# Patient Record
Sex: Female | Born: 1960
Health system: Southern US, Community
[De-identification: ages and names within clinical notes are randomized; demographics above are authoritative.]

## PROBLEM LIST (undated history)

## (undated) DIAGNOSIS — E039 Hypothyroidism, unspecified: Secondary | ICD-10-CM

## (undated) DIAGNOSIS — M8580 Other specified disorders of bone density and structure, unspecified site: Secondary | ICD-10-CM

## (undated) DIAGNOSIS — M1990 Unspecified osteoarthritis, unspecified site: Secondary | ICD-10-CM

## (undated) DIAGNOSIS — G43909 Migraine, unspecified, not intractable, without status migrainosus: Secondary | ICD-10-CM

## (undated) DIAGNOSIS — I10 Essential (primary) hypertension: Secondary | ICD-10-CM

## (undated) DIAGNOSIS — F419 Anxiety disorder, unspecified: Secondary | ICD-10-CM

## (undated) DIAGNOSIS — G4730 Sleep apnea, unspecified: Secondary | ICD-10-CM

## (undated) DIAGNOSIS — E079 Disorder of thyroid, unspecified: Secondary | ICD-10-CM

## (undated) DIAGNOSIS — K635 Polyp of colon: Secondary | ICD-10-CM

## (undated) DIAGNOSIS — R519 Headache, unspecified: Secondary | ICD-10-CM

## (undated) DIAGNOSIS — T7840XA Allergy, unspecified, initial encounter: Secondary | ICD-10-CM

## (undated) DIAGNOSIS — K921 Melena: Secondary | ICD-10-CM

## (undated) DIAGNOSIS — S82852S Displaced trimalleolar fracture of left lower leg, sequela: Secondary | ICD-10-CM

## (undated) DIAGNOSIS — F329 Major depressive disorder, single episode, unspecified: Secondary | ICD-10-CM

## (undated) DIAGNOSIS — L309 Dermatitis, unspecified: Secondary | ICD-10-CM

## (undated) DIAGNOSIS — K219 Gastro-esophageal reflux disease without esophagitis: Secondary | ICD-10-CM

## (undated) DIAGNOSIS — R51 Headache: Secondary | ICD-10-CM

## (undated) DIAGNOSIS — F32A Depression, unspecified: Secondary | ICD-10-CM

## (undated) DIAGNOSIS — G4733 Obstructive sleep apnea (adult) (pediatric): Secondary | ICD-10-CM

## (undated) HISTORY — DX: Dermatitis, unspecified: L30.9

## (undated) HISTORY — DX: Headache, unspecified: R51.9

## (undated) HISTORY — PX: WISDOM TOOTH EXTRACTION: SHX21

## (undated) HISTORY — DX: Migraine, unspecified, not intractable, without status migrainosus: G43.909

## (undated) HISTORY — DX: Headache: R51

## (undated) HISTORY — DX: Polyp of colon: K63.5

## (undated) HISTORY — DX: Major depressive disorder, single episode, unspecified: F32.9

## (undated) HISTORY — DX: Allergy, unspecified, initial encounter: T78.40XA

## (undated) HISTORY — DX: Melena: K92.1

## (undated) HISTORY — DX: Depression, unspecified: F32.A

## (undated) HISTORY — DX: Essential (primary) hypertension: I10

## (undated) HISTORY — DX: Obstructive sleep apnea (adult) (pediatric): G47.33

## (undated) HISTORY — DX: Other specified disorders of bone density and structure, unspecified site: M85.80

## (undated) HISTORY — DX: Sleep apnea, unspecified: G47.30

## (undated) HISTORY — DX: Unspecified osteoarthritis, unspecified site: M19.90

## (undated) HISTORY — DX: Disorder of thyroid, unspecified: E07.9

---

## 2002-03-30 HISTORY — PX: ABDOMINAL HYSTERECTOMY: SHX81

## 2014-06-22 ENCOUNTER — Ambulatory Visit: Payer: Managed Care, Other (non HMO) | Admitting: Internal Medicine

## 2014-06-25 ENCOUNTER — Encounter: Payer: Self-pay | Admitting: Internal Medicine

## 2014-06-25 ENCOUNTER — Ambulatory Visit (INDEPENDENT_AMBULATORY_CARE_PROVIDER_SITE_OTHER): Payer: Managed Care, Other (non HMO) | Admitting: Internal Medicine

## 2014-06-25 VITALS — BP 152/78 | HR 91 | Temp 98.1°F | Resp 18 | Ht 62.0 in | Wt 223.0 lb

## 2014-06-25 DIAGNOSIS — G4733 Obstructive sleep apnea (adult) (pediatric): Secondary | ICD-10-CM

## 2014-06-25 DIAGNOSIS — F329 Major depressive disorder, single episode, unspecified: Secondary | ICD-10-CM

## 2014-06-25 DIAGNOSIS — E039 Hypothyroidism, unspecified: Secondary | ICD-10-CM

## 2014-06-25 DIAGNOSIS — R209 Unspecified disturbances of skin sensation: Secondary | ICD-10-CM | POA: Diagnosis not present

## 2014-06-25 DIAGNOSIS — F32A Depression, unspecified: Secondary | ICD-10-CM

## 2014-06-25 DIAGNOSIS — I10 Essential (primary) hypertension: Secondary | ICD-10-CM

## 2014-06-25 DIAGNOSIS — J3089 Other allergic rhinitis: Secondary | ICD-10-CM

## 2014-06-25 DIAGNOSIS — K219 Gastro-esophageal reflux disease without esophagitis: Secondary | ICD-10-CM

## 2014-06-25 NOTE — Patient Instructions (Signed)
We will send you to the endocrine doctor for the thyroid. We will also get you a lung doctor for the CPAP. We will send you to a neurologist as well.   We will not check blood work today but will get your records.   When you return we will get you to the stomach doctor for the colon cancer screening. This way we will have your records from that as well.   We will see you back in about 6-12 months for a check up, if you have any new problems or questions please feel free to call the office before then. We are usually able to see you same day or next day for sickness and have a Saturday clinic at our office every week (I am not always here for that).   Health Maintenance Adopting a healthy lifestyle and getting preventive care can go a long way to promote health and wellness. Talk with your health care provider about what schedule of regular examinations is right for you. This is a good chance for you to check in with your provider about disease prevention and staying healthy. In between checkups, there are plenty of things you can do on your own. Experts have done a lot of research about which lifestyle changes and preventive measures are most likely to keep you healthy. Ask your health care provider for more information. WEIGHT AND DIET  Eat a healthy diet  Be sure to include plenty of vegetables, fruits, low-fat dairy products, and lean protein.  Do not eat a lot of foods high in solid fats, added sugars, or salt.  Get regular exercise. This is one of the most important things you can do for your health.  Most adults should exercise for at least 150 minutes each week. The exercise should increase your heart rate and make you sweat (moderate-intensity exercise).  Most adults should also do strengthening exercises at least twice a week. This is in addition to the moderate-intensity exercise.  Maintain a healthy weight  Body mass index (BMI) is a measurement that can be used to identify  possible weight problems. It estimates body fat based on height and weight. Your health care provider can help determine your BMI and help you achieve or maintain a healthy weight.  For females 18 years of age and older:   A BMI below 18.5 is considered underweight.  A BMI of 18.5 to 24.9 is normal.  A BMI of 25 to 29.9 is considered overweight.  A BMI of 30 and above is considered obese.  Watch levels of cholesterol and blood lipids  You should start having your blood tested for lipids and cholesterol at 54 years of age, then have this test every 5 years.  You may need to have your cholesterol levels checked more often if:  Your lipid or cholesterol levels are high.  You are older than 54 years of age.  You are at high risk for heart disease.  CANCER SCREENING   Lung Cancer  Lung cancer screening is recommended for adults 72-70 years old who are at high risk for lung cancer because of a history of smoking.  A yearly low-dose CT scan of the lungs is recommended for people who:  Currently smoke.  Have quit within the past 15 years.  Have at least a 30-pack-year history of smoking. A pack year is smoking an average of one pack of cigarettes a day for 1 year.  Yearly screening should continue until it has been  15 years since you quit.  Yearly screening should stop if you develop a health problem that would prevent you from having lung cancer treatment.  Breast Cancer  Practice breast self-awareness. This means understanding how your breasts normally appear and feel.  It also means doing regular breast self-exams. Let your health care provider know about any changes, no matter how small.  If you are in your 20s or 30s, you should have a clinical breast exam (CBE) by a health care provider every 1-3 years as part of a regular health exam.  If you are 71 or older, have a CBE every year. Also consider having a breast X-ray (mammogram) every year.  If you have a family  history of breast cancer, talk to your health care provider about genetic screening.  If you are at high risk for breast cancer, talk to your health care provider about having an MRI and a mammogram every year.  Breast cancer gene (BRCA) assessment is recommended for women who have family members with BRCA-related cancers. BRCA-related cancers include:  Breast.  Ovarian.  Tubal.  Peritoneal cancers.  Results of the assessment will determine the need for genetic counseling and BRCA1 and BRCA2 testing. Cervical Cancer Routine pelvic examinations to screen for cervical cancer are no longer recommended for nonpregnant women who are considered low risk for cancer of the pelvic organs (ovaries, uterus, and vagina) and who do not have symptoms. A pelvic examination may be necessary if you have symptoms including those associated with pelvic infections. Ask your health care provider if a screening pelvic exam is right for you.   The Pap test is the screening test for cervical cancer for women who are considered at risk.  If you had a hysterectomy for a problem that was not cancer or a condition that could lead to cancer, then you no longer need Pap tests.  If you are older than 65 years, and you have had normal Pap tests for the past 10 years, you no longer need to have Pap tests.  If you have had past treatment for cervical cancer or a condition that could lead to cancer, you need Pap tests and screening for cancer for at least 20 years after your treatment.  If you no longer get a Pap test, assess your risk factors if they change (such as having a new sexual partner). This can affect whether you should start being screened again.  Some women have medical problems that increase their chance of getting cervical cancer. If this is the case for you, your health care provider may recommend more frequent screening and Pap tests.  The human papillomavirus (HPV) test is another test that may be used  for cervical cancer screening. The HPV test looks for the virus that can cause cell changes in the cervix. The cells collected during the Pap test can be tested for HPV.  The HPV test can be used to screen women 59 years of age and older. Getting tested for HPV can extend the interval between normal Pap tests from three to five years.  An HPV test also should be used to screen women of any age who have unclear Pap test results.  After 54 years of age, women should have HPV testing as often as Pap tests.  Colorectal Cancer  This type of cancer can be detected and often prevented.  Routine colorectal cancer screening usually begins at 54 years of age and continues through 54 years of age.  Your health  care provider may recommend screening at an earlier age if you have risk factors for colon cancer.  Your health care provider may also recommend using home test kits to check for hidden blood in the stool.  A small camera at the end of a tube can be used to examine your colon directly (sigmoidoscopy or colonoscopy). This is done to check for the earliest forms of colorectal cancer.  Routine screening usually begins at age 28.  Direct examination of the colon should be repeated every 5-10 years through 54 years of age. However, you may need to be screened more often if early forms of precancerous polyps or small growths are found. Skin Cancer  Check your skin from head to toe regularly.  Tell your health care provider about any new moles or changes in moles, especially if there is a change in a mole's shape or color.  Also tell your health care provider if you have a mole that is larger than the size of a pencil eraser.  Always use sunscreen. Apply sunscreen liberally and repeatedly throughout the day.  Protect yourself by wearing long sleeves, pants, a wide-brimmed hat, and sunglasses whenever you are outside. HEART DISEASE, DIABETES, AND HIGH BLOOD PRESSURE   Have your blood pressure  checked at least every 1-2 years. High blood pressure causes heart disease and increases the risk of stroke.  If you are between 17 years and 2 years old, ask your health care provider if you should take aspirin to prevent strokes.  Have regular diabetes screenings. This involves taking a blood sample to check your fasting blood sugar level.  If you are at a normal weight and have a low risk for diabetes, have this test once every three years after 54 years of age.  If you are overweight and have a high risk for diabetes, consider being tested at a younger age or more often. PREVENTING INFECTION  Hepatitis B  If you have a higher risk for hepatitis B, you should be screened for this virus. You are considered at high risk for hepatitis B if:  You were born in a country where hepatitis B is common. Ask your health care provider which countries are considered high risk.  Your parents were born in a high-risk country, and you have not been immunized against hepatitis B (hepatitis B vaccine).  You have HIV or AIDS.  You use needles to inject street drugs.  You live with someone who has hepatitis B.  You have had sex with someone who has hepatitis B.  You get hemodialysis treatment.  You take certain medicines for conditions, including cancer, organ transplantation, and autoimmune conditions. Hepatitis C  Blood testing is recommended for:  Everyone born from [birth year] through 1965.  Anyone with known risk factors for hepatitis C. Sexually transmitted infections (STIs)  You should be screened for sexually transmitted infections (STIs) including gonorrhea and chlamydia if:  You are sexually active and are younger than 54 years of age.  You are older than 54 years of age and your health care provider tells you that you are at risk for this type of infection.  Your sexual activity has changed since you were last screened and you are at an increased risk for chlamydia or gonorrhea. Ask  your health care provider if you are at risk.  If you do not have HIV, but are at risk, it may be recommended that you take a prescription medicine daily to prevent HIV infection. This is called pre-exposure  prophylaxis (PrEP). You are considered at risk if:  You are sexually active and do not regularly use condoms or know the HIV status of your partner(s).  You take drugs by injection.  You are sexually active with a partner who has HIV. Talk with your health care provider about whether you are at high risk of being infected with HIV. If you choose to begin PrEP, you should first be tested for HIV. You should then be tested every 3 months for as long as you are taking PrEP.  PREGNANCY   If you are premenopausal and you may become pregnant, ask your health care provider about preconception counseling.  If you may become pregnant, take 400 to 800 micrograms (mcg) of folic acid every day.  If you want to prevent pregnancy, talk to your health care provider about birth control (contraception). OSTEOPOROSIS AND MENOPAUSE   Osteoporosis is a disease in which the bones lose minerals and strength with aging. This can result in serious bone fractures. Your risk for osteoporosis can be identified using a bone density scan.  If you are 43 years of age or older, or if you are at risk for osteoporosis and fractures, ask your health care provider if you should be screened.  Ask your health care provider whether you should take a calcium or vitamin D supplement to lower your risk for osteoporosis.  Menopause may have certain physical symptoms and risks.  Hormone replacement therapy may reduce some of these symptoms and risks. Talk to your health care provider about whether hormone replacement therapy is right for you.  HOME CARE INSTRUCTIONS   Schedule regular health, dental, and eye exams.  Stay current with your immunizations.   Do not use any tobacco products including cigarettes, chewing  tobacco, or electronic cigarettes.  If you are pregnant, do not drink alcohol.  If you are breastfeeding, limit how much and how often you drink alcohol.  Limit alcohol intake to no more than 1 drink per day for nonpregnant women. One drink equals 12 ounces of beer, 5 ounces of wine, or 1 ounces of hard liquor.  Do not use street drugs.  Do not share needles.  Ask your health care provider for help if you need support or information about quitting drugs.  Tell your health care provider if you often feel depressed.  Tell your health care provider if you have ever been abused or do not feel safe at home. Document Released: 09/29/2010 Document Revised: 07/31/2013 Document Reviewed: 02/15/2013 [facility] Patient Information 2015 [place], [place]. This information is not intended to replace advice given to you by your health care provider. Make sure you discuss any questions you have with your health care provider.

## 2014-06-25 NOTE — Progress Notes (Signed)
Pre visit review using our clinic review tool, if applicable. No additional management support is needed unless otherwise documented below in the visit note. 

## 2014-06-27 ENCOUNTER — Encounter: Payer: Self-pay | Admitting: Internal Medicine

## 2014-06-27 DIAGNOSIS — F329 Major depressive disorder, single episode, unspecified: Secondary | ICD-10-CM | POA: Insufficient documentation

## 2014-06-27 DIAGNOSIS — J309 Allergic rhinitis, unspecified: Secondary | ICD-10-CM | POA: Insufficient documentation

## 2014-06-27 DIAGNOSIS — F32A Depression, unspecified: Secondary | ICD-10-CM | POA: Insufficient documentation

## 2014-06-27 DIAGNOSIS — I10 Essential (primary) hypertension: Secondary | ICD-10-CM | POA: Insufficient documentation

## 2014-06-27 DIAGNOSIS — K219 Gastro-esophageal reflux disease without esophagitis: Secondary | ICD-10-CM | POA: Insufficient documentation

## 2014-06-27 DIAGNOSIS — E039 Hypothyroidism, unspecified: Secondary | ICD-10-CM | POA: Insufficient documentation

## 2014-06-27 NOTE — Progress Notes (Signed)
   Subjective:    Patient ID: Mary Reyes, female    DOB: 07-16-1960, 54 y.o.   MRN: [id number]  HPI The patient is a 54 YO female who is coming in as a new patient to check on her hypertension (no complications, somewhat controlled, on hctz), her thyroid (currently stable, on synthroid and cytomel, no complications), gerd (decent control, hx ulcer, on protonix).   PMH, [facility], social history reviewed and updated during visit.   Review of Systems  Constitutional: Negative for fever, activity change, appetite change, fatigue and unexpected weight change.  HENT: Negative.   Eyes: Negative.   Respiratory: Negative for cough, chest tightness and shortness of breath.   Cardiovascular: Negative for chest pain, palpitations and leg swelling.  Gastrointestinal: Negative for abdominal pain, diarrhea, constipation and abdominal distention.       Some gerd symptoms  Endocrine: Negative.   Musculoskeletal: Negative.   Neurological: Negative.   Psychiatric/Behavioral: Negative.       Objective:   Physical Exam  Constitutional: She is oriented to person, place, and time. She appears well-developed and well-nourished.  Overweight  HENT:  Head: Normocephalic and atraumatic.  Eyes: EOM are normal.  Neck: Normal range of motion.  Cardiovascular: Normal rate and regular rhythm.   Pulmonary/Chest: Effort normal and breath sounds normal.  Abdominal: Soft. Bowel sounds are normal.  Musculoskeletal: She exhibits no edema.  Neurological: She is alert and oriented to person, place, and time.  Skin: Skin is warm and dry.   Filed Vitals:   06/25/14 1446  BP: 152/78  Pulse: 91  Temp: 98.1 F (36.7 C)  TempSrc: Oral  Resp: 18  Height: 5\' 2"  (1.575 m)  Weight: 223 lb (101.152 kg)  SpO2: 93%      Assessment & Plan:

## 2014-06-27 NOTE — Assessment & Plan Note (Signed)
Doing well on protonix. Still mild symptoms with certain dietary indiscretions. Remote history of ulcer. Talked to her about diet changes and behavioral changes to decrease symptoms.

## 2014-06-27 NOTE — Assessment & Plan Note (Signed)
BP mildly elevated in the office today but she claims always well controlled at home when she checks (120s/70s). Will obtain records and increase dosing if she is still not controlled at follow up. She states labs just checked and will obtain those records in order to not duplicate work. Will use that to elevate for any complications.

## 2014-06-27 NOTE — Assessment & Plan Note (Signed)
Doing okay on amantadine and occasional xanax (for extra life stressors). No side effects.

## 2014-06-27 NOTE — Assessment & Plan Note (Signed)
Uses xyzal for seasonal allergies and not year round. Doing okay on this regimen and will maintain.

## 2014-06-27 NOTE — Assessment & Plan Note (Signed)
Currently controlled on synthroid and cytomel. Will check thyroid levels today and adjust if needed.

## 2014-07-13 ENCOUNTER — Telehealth: Payer: Self-pay | Admitting: Internal Medicine

## 2014-07-13 MED ORDER — PAROXETINE HCL 20 MG PO TABS: 20.0000 mg | ORAL_TABLET | Freq: Every day | ORAL | Status: DC

## 2014-07-13 NOTE — Telephone Encounter (Signed)
Dr. Doug Sou had suggested on her last appt. About getting on an antidepressant but did not give her one. She would like a prescription for that

## 2014-07-13 NOTE — Telephone Encounter (Signed)
To start a new medicine she would need to stop the amantadine for 1 week and then we can start a new medicine if she wants. Will send in paroxetine 20 mg daily and sent to her pharmacy.

## 2014-07-16 NOTE — Telephone Encounter (Signed)
Patient aware and will stop the amantadine and start paxil in one week.

## 2014-07-16 NOTE — Telephone Encounter (Signed)
Left message for patient to call me back to discuss medications.

## 2014-07-20 ENCOUNTER — Encounter: Payer: Self-pay | Admitting: Internal Medicine

## 2014-07-20 ENCOUNTER — Ambulatory Visit (INDEPENDENT_AMBULATORY_CARE_PROVIDER_SITE_OTHER): Payer: Managed Care, Other (non HMO) | Admitting: Internal Medicine

## 2014-07-20 VITALS — BP 130/82 | HR 105 | Temp 98.1°F | Resp 14 | Ht 62.0 in | Wt 222.8 lb

## 2014-07-20 DIAGNOSIS — E039 Hypothyroidism, unspecified: Secondary | ICD-10-CM

## 2014-07-20 DIAGNOSIS — E559 Vitamin D deficiency, unspecified: Secondary | ICD-10-CM | POA: Diagnosis not present

## 2014-07-20 LAB — TSH: TSH: 1.08 u[IU]/mL (ref 0.35–4.50)

## 2014-07-20 LAB — VITAMIN D 25 HYDROXY (VIT D DEFICIENCY, FRACTURES): VITD: 45.34 ng/mL (ref 30.00–100.00)

## 2014-07-20 LAB — T3, FREE: T3, Free: 3.2 pg/mL (ref 2.3–4.2)

## 2014-07-20 LAB — T4, FREE: Free T4: 0.86 ng/dL (ref 0.60–1.60)

## 2014-07-20 NOTE — Progress Notes (Signed)
Patient ID: Mary Reyes, female   DOB: Aug 13, 1960, 54 y.o.   MRN: [id number]   HPI  Mary Reyes is a 54 y.o.-year-old female, referred by her PCP, Dr. Doug Sou, for management of hypothyroidism, vitamin D deficiency and osteopenia. She is here with her daughter who offers part of the history.  Hypothyroidism: She tells me that her thyroid problem started when she found a "lump in her neck" in 2001. She saw ENT at that time, who was following the nodule, which kept growing. She remembers that she was then dx with Mononucleosis. She had a FNA of the nodule and this apparently returned positive for lymphoid tissue. It was considered that this was an enlarged lymph node possibly from mononucleosis. She did not have a thyroid U/S after this episode. She remembers that the nodule did decrease in size after prednisone.  Patient was found to be hyperthyroid during the investigation for her enlarged lymph node. She initially tried oral anti-thyroid medicines, which were not effective. She had RAI tx in 2002. She did not have the start LT4 after her RAI Tx, but became hypothyroid and had to start levothyroxine in 2008. She is not sure why she was recommended to add LT3, but she believes that it was added because of her history of depression.   She is on Levothyroxine 50 mcg on 6/7 days, and 100 mcg 1/7 days (LT4) + Cytomel 7.5 mcg (LT3), taken: - fasting - with water - separated by >30 min from b'fast  - + calcium - later in the day - + PPIs at night - + multivitamins - later in the day  I reviewed pt's thyroid tests: 04/03/2014: TSH 0.92, fT4 0.97, fT3 2.8  Pt describes: - no weight gain - + fatigue - + cold intolerance - + Anxiety and depression - + Diarrhea and constipation - no dry skin - + hair loss  Pt denies feeling nodules in neck, hoarseness, dysphagia/odynophagia, SOB with lying down.  She has + FH of thyroid disorders in: thyroiditis. No FH of thyroid cancer.  + h/o radiation tx to  head or neck. No recent use of iodine supplements.  She also has vitamin D deficiency: - she is on Ergocalciferol 50,000 units once a month - she was off it for 6-7 months, restarted 2-3 mo ago  She also has osteopenia: - last DEXA scan was 2 years ago - No history of fractures - No previous antiresorptive treatment  Previous endocrinologist Dr. Abel Presto [phone] - in [place].  I reviewed her chart and she also has a history of ?MS, GERD, HTN, OSA, migraines.  ROS: Constitutional: see HPI Eyes: no blurry vision, no xerophthalmia ENT: no sore throat, no nodules palpated in throat, no dysphagia/odynophagia, no hoarseness Cardiovascular: no CP/SOB/palpitations/+ leg swelling Respiratory: no cough/SOB Gastrointestinal: no N/V/+ D/+ C/ + acid reflux Musculoskeletal: + both:muscle/joint aches Skin: no rashes, + easy bruising, stretch marks, hair loss Neurological: no tremors/numbness/tingling/dizziness Psychiatric: + Both: depression/anxiety  Past Medical History  Diagnosis Date  . Arthritis   . Depression   . Allergy   . Blood in stool   . Colon polyp   . Frequent headaches   . Hypertension   . Migraine   . Thyroid disease    Past Surgical History  Procedure Laterality Date  . Abdominal hysterectomy  2004   History   Social History  . Marital Status: Married    Spouse Name: N/A  . Number of Children:  3    Occupational  History  . N/a   Social History Main Topics  . Smoking status: Never Smoker   . Smokeless tobacco: Not on file  . Alcohol Use: No  . Drug Use: No   Current Outpatient Prescriptions on File Prior to Visit  Medication Sig Dispense Refill  . calcium-vitamin D (OSCAL WITH D) 250-125 MG-UNIT per tablet Take 1 tablet by mouth daily.    . cyclobenzaprine (FLEXERIL) 10 MG tablet Take 10 mg by mouth 3 (three) times daily.   1  . estradiol (ESTRACE) 0.1 MG/GM vaginal cream Place 1 Applicatorful vaginally 3 (three) times a week.    . gabapentin  (NEURONTIN) 100 MG capsule Take 100 mg by mouth 3 (three) times daily.   3  . hydrochlorothiazide (MICROZIDE) 12.5 MG capsule Take 12.5 mg by mouth daily.    Marland Kitchen levocetirizine (XYZAL) 5 MG tablet Take 5 mg by mouth every evening.   3  . levothyroxine (SYNTHROID, LEVOTHROID) 50 MCG tablet Take 50 mcg by mouth. Takes two on Sunday.  3  . liothyronine (CYTOMEL) 5 MCG tablet 5 mcg. Take one and one half tablets daily.    . Multiple Vitamins-Minerals (MULTIVITAMIN WITH MINERALS) tablet Take 1 tablet by mouth daily.    . Olopatadine HCl (PATANASE NA) Place into the nose.    . pantoprazole (PROTONIX) 40 MG tablet Take 40 mg by mouth daily.    . Vitamin D, Ergocalciferol, (DRISDOL) 50000 UNITS CAPS capsule Take 50,000 Units by mouth every 30 (thirty) days.   3  . ALPRAZolam (XANAX) 0.25 MG tablet Take 0.25 mg by mouth at bedtime as needed.   0  . amantadine (SYMMETREL) 100 MG capsule Take 100 mg by mouth 2 (two) times daily.  2  . PARoxetine (PAXIL) 20 MG tablet Take 1 tablet (20 mg total) by mouth daily. (Patient not taking: Reported on 07/20/2014) 30 tablet 3   No current facility-administered medications on file prior to visit.   Allergies  Allergen Reactions  . Benzoyl Peroxide    Family History  Problem Relation Age of Onset  . Alcohol abuse Father   . Stroke Maternal Grandmother   . Arthritis Paternal Grandmother   + see HPI Also:  Sisters with hypertension  Child with PDA  2 daughters with history of thyroiditis  Sister with brain tumor  PE: BP 130/82 mmHg  Pulse 105  Temp(Src) 98.1 F (36.7 C) (Oral)  Resp 14  Ht 5\' 2"  (1.575 m)  Wt 222 lb 12.8 oz (101.061 kg)  BMI 40.74 kg/m2  SpO2 97% Wt Readings from Last 3 Encounters:  07/20/14 222 lb 12.8 oz (101.061 kg)  06/25/14 223 lb (101.152 kg)   Constitutional: Obese, in NAD Eyes: PERRLA, EOMI, no exophthalmos ENT: moist mucous membranes, no thyromegaly, no cervical lymphadenopathy Cardiovascular: RRR, No  MRG Respiratory: CTA B Gastrointestinal: abdomen soft, NT, ND, BS+ Musculoskeletal: no deformities, strength intact in all 4 Skin: moist, warm, no rashes Neurological: no tremor with outstretched hands, DTR normal in all 4  ASSESSMENT: 1. Hypothyroidism  2. Vitamin D deficiency  3. Osteopenia  PLAN:  1. Hypothyroidism Patient with long-standing hypothyroidism, on levothyroxine and Cytomel therapy. She appears euthyroid. She does not appear to have a goiter, thyroid nodules, or neck compression symptoms - We discussed about correct intake of the thyroid hormones, fasting, with water, separated by at least 30 minutes from breakfast, and separated by more than 4 hours from calcium, iron, multivitamins, acid reflux medications (PPIs). She is taking them correctly. - will  check thyroid tests today: TSH, free T4, free T3 - If these are abnormal, she will need to return in 6-8 weeks for repeat labs - If these are normal, I will see her back in 6 months - We did discuss about possibly changing the regimen to a less complicated one, that gives her consistent thyroid hormone doses throughout the week. However, she would not want to stop the current regimen, since she feels good on it and her thyroid tests have been stable. I agreed to continue the same doses if the tests are normal today.   2. Vitamin D deficiency - She is now on high-dose vitamin D, presumably 50,000 units, once a month 2-3 months - We will check a vitamin D level today and adjust the dose as needed  3. Osteopenia - She does not have any history of fractures - We'll obtain the records for her DEXA scans from her previous endocrinologist, Dr. Abel Presto - patient signed a release of information consent form today. - Patient tells me she is due for another DEXA scan. I will order it after I review the records  Office Visit on 07/20/2014  Component Date Value Ref Range Status  . T3, Free 07/20/2014 3.2  2.3 - 4.2 pg/mL Final  .  Free T4 07/20/2014 0.86  0.60 - 1.60 ng/dL Final  . TSH 07/20/2014 1.08  0.35 - 4.50 uIU/mL Final  . VITD 07/20/2014 45.34  30.00 - 100.00 ng/mL Final   Thyroid tests are normal. Continue:  LT4 50 mcg on 6/7 days, and 100 mcg 1/7 days   LT3 7.5 mcg daily Vitamin D normal. Continue 50,000 units of ergocalciferol once a month.

## 2014-07-20 NOTE — Patient Instructions (Signed)
Please continue the current thyroid hormone regimen. Also, for now, continue Ergocalciferol (high dose vitamin dose) once a month.  Please come back for a follow-up appointment in 6 months.  Please stop at the lab.

## 2014-07-24 ENCOUNTER — Ambulatory Visit (INDEPENDENT_AMBULATORY_CARE_PROVIDER_SITE_OTHER): Payer: Managed Care, Other (non HMO) | Admitting: Neurology

## 2014-07-24 ENCOUNTER — Encounter: Payer: Self-pay | Admitting: Neurology

## 2014-07-24 VITALS — BP 122/84 | HR 83 | Resp 16 | Ht 62.0 in | Wt 223.0 lb

## 2014-07-24 DIAGNOSIS — R209 Unspecified disturbances of skin sensation: Secondary | ICD-10-CM

## 2014-07-24 DIAGNOSIS — F329 Major depressive disorder, single episode, unspecified: Secondary | ICD-10-CM | POA: Diagnosis not present

## 2014-07-24 DIAGNOSIS — R5381 Other malaise: Secondary | ICD-10-CM

## 2014-07-24 DIAGNOSIS — F32A Depression, unspecified: Secondary | ICD-10-CM

## 2014-07-24 LAB — VITAMIN B12: Vitamin B-12: 906 pg/mL (ref 211–911)

## 2014-07-24 NOTE — Patient Instructions (Addendum)
1.  Recommend that you establish care with psychiatry and counselor 2.  Check vitamin B12 3.  Return to clinic as needed

## 2014-07-24 NOTE — Progress Notes (Signed)
[facility] Clinic Note - Initial Visit   Date: 07/24/2014   Darrell Leonhardt MRN: [id number] DOB: [birth year]   Dear Dr. Doug Sou:   Thank you for your kind referral of Emmalee Solivan for consultation of disturbance of skin sensation. Although her history is well known to you, please allow [place] to reiterate it for the purpose of our medical record. The patient was accompanied to the clinic by daughter who also provides collateral information.     History of Present Illness: Harlynn Kimbell is a 54 y.o. right-handed Caucasian female with hypertension, hypothyroidism, GERD presenting for evaluation of abnormal skin sensation.    Starting in early 2000s, she developed abnormal skin sensation over the whole body.  She cannot perceive sensation as well. She has seen a number of neurologists in [place] and underwent two EMGs which were normal.  Around 2013, she developed a spell of slurred speech, gait difficulty, and frequent falls.  She also developed muscle stiffness with generalized pain. There was a concern of whether she had multiple sclerosis and was being followed clinically as her MRI brain was always stable.  Her leg weakness slowly improved with physical therapy.  She moved from [facility] in January 2016 and has been establishing care with various specialists, so is here for evaluation.   She wishes that she was able to think better and is battling depression.  She has never seen a psychiatrist, even though it was recommended by several physicians in the past.  She did see a counselor at some point.  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  She is not on disability.    Out-side paper records, electronic medical record, and images have been reviewed where available and summarized as:  Lab Results  Component Value Date   TSH 1.08 07/20/2014   Labs 04/03/2014:  Free T4 0.97  MRI cervical spine wwo contrast 02/12/2006:   Protrusion of the dsc at C7-T1 and T1-T2 as described above with mild bulging of the disc at C5-C6 and C6-C7.  MRI brain wwo contrast 09/14/2011:   1.  White matter tract lesions in the subcortical white matter tracts of both cerebral hemispheres as well as signal changes in the pons present on the new study not confirmed by the old study.  These may represent areas of demyelinating disease. 2.  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 3.  No new or other abnormalities  MRI brain 12/27/2012:  No evidence of abnormal enhancing lesion of the brain; stable white matter changes  Past Medical History  Diagnosis Date  . Arthritis   . Depression   . Allergy   . Blood in stool   . Colon polyp   . Frequent headaches   . Hypertension   . Migraine   . Thyroid disease     Past Surgical History  Procedure Laterality Date  . Abdominal hysterectomy  2004     Medications:  Current Outpatient Prescriptions on File Prior to Visit  Medication Sig Dispense Refill  . ALPRAZolam (XANAX) 0.25 MG tablet Take 0.25 mg by mouth at bedtime as needed.   0  . amantadine (SYMMETREL) 100 MG capsule Take 100 mg by mouth 2 (two) times daily.  2  . calcium-vitamin D (OSCAL WITH D) 250-125 MG-UNIT per tablet Take 1 tablet by mouth daily.    . cyclobenzaprine (FLEXERIL) 10 MG tablet Take 10 mg by mouth 3 (  three) times daily.   1  . estradiol (ESTRACE) 0.1 MG/GM vaginal cream Place 1 Applicatorful vaginally 3 (three) times a week.    . gabapentin (NEURONTIN) 100 MG capsule Take 100 mg by mouth 3 (three) times daily.   3  . hydrochlorothiazide (MICROZIDE) 12.5 MG capsule Take 12.5 mg by mouth daily.    Marland Kitchen levocetirizine (XYZAL) 5 MG tablet Take 5 mg by mouth every evening.   3  . levothyroxine (SYNTHROID, LEVOTHROID) 50 MCG tablet Take 50 mcg by mouth. Takes two on Sunday.  3  . liothyronine (CYTOMEL) 5 MCG tablet 5 mcg. Take one and one half tablets daily.    .  Multiple Vitamins-Minerals (MULTIVITAMIN WITH MINERALS) tablet Take 1 tablet by mouth daily.    . Olopatadine HCl (PATANASE NA) Place into the nose.    . pantoprazole (PROTONIX) 40 MG tablet Take 40 mg by mouth daily.    Marland Kitchen PARoxetine (PAXIL) 20 MG tablet Take 1 tablet (20 mg total) by mouth daily. (Patient not taking: Reported on 07/20/2014) 30 tablet 3  . Vitamin D, Ergocalciferol, (DRISDOL) 50000 UNITS CAPS capsule Take 50,000 Units by mouth every 30 (thirty) days.   3   No current facility-administered medications on file prior to visit.    Allergies:  Allergies  Allergen Reactions  . Benzoyl Peroxide     Family History: Family History  Problem Relation Age of Onset  . Alcohol abuse Father   . Stroke Maternal Grandmother   . Arthritis Paternal Grandmother     Social History: History   Social History  . Marital Status: Married    Spouse Name: N/A  . Number of Children: N/A  . Years of Education: N/A   Occupational History  . Not on file.   Social History Main Topics  . Smoking status: Never Smoker   . Smokeless tobacco: Not on file  . Alcohol Use: No  . Drug Use: No  . Sexual Activity: Not on file   Other Topics Concern  . Not on file   Social History Narrative    Review of Systems:  CONSTITUTIONAL: No fevers, chills, night sweats, or weight loss.   EYES: No visual changes or eye pain ENT: No hearing changes.  No history of nose bleeds.   RESPIRATORY: No cough, wheezing and shortness of breath.   CARDIOVASCULAR: Negative for chest pain, and palpitations.   GI: Negative for abdominal discomfort, blood in stools or black stools.  No recent change in bowel habits.   GU:  No history of incontinence.   MUSCLOSKELETAL: No history of joint pain or swelling.  No myalgias.   SKIN: Negative for lesions, rash, and itching.   HEMATOLOGY/ONCOLOGY: Negative for prolonged bleeding, bruising easily, and swollen nodes.  No history of cancer.   ENDOCRINE: Negative for cold  or heat intolerance, polydipsia or goiter.   PSYCH:  ++depression or anxiety symptoms.   NEURO: As Above.   Vital Signs:  BP 122/84 mmHg  Pulse 83  Resp 16  Ht 5\' 2"  (1.575 m)  Wt 223 lb (101.152 kg)  BMI 40.78 kg/m2  SpO2 96% Pain Scale: 0 on a scale of 0-10   General Medical Exam:   General:  Anxious appearing, comfortable.   Eyes/ENT: see cranial nerve examination.   Neck: No masses appreciated.  Full range of motion without tenderness.  No carotid bruits. Respiratory:  Clear to auscultation, good air entry bilaterally.   Cardiac:  Regular rate and rhythm, no murmur.   Extremities:  No deformities, edema, or skin discoloration.  Skin:  No rashes or lesions.  Neurological Exam: MENTAL STATUS including orientation to time, place, person, recent and remote memory, attention span and concentration, language, and fund of knowledge is normal.  Speech is not dysarthric.  CRANIAL NERVES: II:  No visual field defects.  Unremarkable fundi.   III-IV-VI: Pupils equal round and reactive to light.  Normal conjugate, extra-ocular eye movements in all directions of gaze.  No nystagmus.  No ptosis.   V:  Normal facial sensation.     VII:  Normal facial symmetry and movements.  No pathologic facial reflexes.  VIII:  Normal hearing and vestibular function.   IX-X:  Normal palatal movement.   XI:  Normal shoulder shrug and head rotation.   XII:  Normal tongue strength and range of motion, no deviation or fasciculation.  MOTOR:  No atrophy, fasciculations or abnormal movements.  No pronator drift.  Tone is normal.    Right Upper Extremity:    Left Upper Extremity:    Deltoid  5/5   Deltoid  5/5   Biceps  5/5   Biceps  5/5   Triceps  5/5   Triceps  5/5   Wrist extensors  5/5   Wrist extensors  5/5   Wrist flexors  5/5   Wrist flexors  5/5   Finger extensors  5/5   Finger extensors  5/5   Finger flexors  5/5   Finger flexors  5/5   Dorsal interossei  5/5   Dorsal interossei  5/5     Abductor pollicis  5/5   Abductor pollicis  5/5   Tone (Ashworth scale)  0  Tone (Ashworth scale)  0   Right Lower Extremity:    Left Lower Extremity:    Hip flexors  5/5   Hip flexors  5/5   Hip extensors  5/5   Hip extensors  5/5   Knee flexors  5/5   Knee flexors  5/5   Knee extensors  5/5   Knee extensors  5/5   Dorsiflexors  5/5   Dorsiflexors  5/5   Plantarflexors  5/5   Plantarflexors  5/5   Toe extensors  5/5   Toe extensors  5/5   Toe flexors  5/5   Toe flexors  5/5   Tone (Ashworth scale)  0  Tone (Ashworth scale)  0   MSRs:  Right                                                                 Left brachioradialis 2+  brachioradialis 2+  biceps 2+  biceps 2+  triceps 2+  triceps 2+  patellar 2+  patellar 2+  ankle jerk 2+  ankle jerk 2+  Hoffman no  Hoffman no  plantar response down  plantar response down   SENSORY:  Normal and symmetric perception of light touch, pinprick, vibration, and proprioception.  Romberg's sign absent.   COORDINATION/GAIT: Normal finger-to- nose-finger and heel-to-shin.  Intact rapid alternating movements bilaterally.  Able to rise from a chair without using arms.  Gait narrow based and stable. She is unsteady with tandem and stressed gait.   IMPRESSION: Mrs. Marin is a 54 year-old female presenting for evaluation of abnormal skin sensation.  She had underwent extensive neurological work-up in the past.  There is no evidence of neuropathy on her EMGs, which she had twice performed.  Her most recent MRI brain was reviewed and shows mild scattered white matter changes, which in my opinion, do not suggest multiple sclerosis.  If there are new symptoms or progression of white matter burden on imaging going forward, I would recommend CSF testing.  At this time, however, her biggest complain is depression so will refer her to psychiatry.  I do feel that her neurological symptoms are a manifestation of stress reaction/anxiety.  Given her low mood, will  screen with vitamin B12 deficiency.  No additional neurological work-up at this time.  Return to clinic as needed   The duration of this appointment visit was 45 minutes of face-to-face time with the patient.  Greater than 50% of this time was spent in counseling, explanation of diagnosis, planning of further management, and coordination of care.   Thank you for allowing me to participate in patient's care.  If I can answer any additional questions, I would be pleased to do so.    Sincerely,    Jayel Scaduto K. Posey Pronto, DO

## 2014-07-25 ENCOUNTER — Encounter: Payer: Self-pay | Admitting: *Deleted

## 2014-08-06 ENCOUNTER — Encounter: Payer: Self-pay | Admitting: Internal Medicine

## 2014-08-07 ENCOUNTER — Ambulatory Visit (INDEPENDENT_AMBULATORY_CARE_PROVIDER_SITE_OTHER): Payer: Managed Care, Other (non HMO) | Admitting: Pulmonary Disease

## 2014-08-07 ENCOUNTER — Encounter: Payer: Self-pay | Admitting: Pulmonary Disease

## 2014-08-07 VITALS — BP 124/70 | HR 81 | Temp 97.5°F | Ht 62.0 in | Wt 221.8 lb

## 2014-08-07 DIAGNOSIS — G4733 Obstructive sleep apnea (adult) (pediatric): Secondary | ICD-10-CM

## 2014-08-07 NOTE — Progress Notes (Signed)
Subjective:    Patient ID: Mary Reyes, female    DOB: [birth year], 54 y.o.   MRN: [id number]  HPI The patient is a 54 year old female who I've been asked to see for management of obstructive sleep apnea. He was diagnosed with OSA that was severe in nature in 2003, and has been on C Pap since that time. She currently has a fairly new device set on a pressure of 18 cm of water, and her recent download shows excellent compliance and good control of her AHI. She uses a nasal C Pap mask, and also uses a heated humidifier. She feels reasonably rested in the mornings upon arising, and is satisfied with her daytime alertness. She does have some sleepiness at times that she blames on other medical issues. The patient states that her weight is stable over the last few years, and her Epworth score today is 11   Sleep Questionnaire What time do you typically go to bed?( Between what hours) 11p-2a 11p-2a at 1417 on 08/07/14 by Inge Rise, CMA How long does it take you to fall asleep? 10 min 10 min at 1417 on 08/07/14 by Inge Rise, CMA How many times during the night do you wake up? 4 4 at 1417 on 08/07/14 by Inge Rise, [place] What time do you get out of bed to start your day? 0900 0900 at 1417 on 08/07/14 by Inge Rise, CMA Do you drive or operate heavy machinery in your occupation? No No at 1417 on 08/07/14 by Inge Rise, CMA How much has your weight changed (up or down) over the past two years? (In pounds) 0 oz (0 kg) 0 oz (0 kg) at 1417 on 08/07/14 by Inge Rise, CMA Have you ever had a sleep study before? Yes Yes at 1417 on 08/07/14 by Inge Rise, CMA If yes[place] 1417 on 08/07/14 by Inge Rise, CMA If yes, date of study? 2003 2003 at 1417 on 08/07/14 by Inge Rise, CMA Do you currently use CPAP? Yes Yes at 1417 on 08/07/14 by Inge Rise, CMA If so, what pressure? not sure not sure at 1417 on 08/07/14 by Inge Rise, CMA Do you wear oxygen at any time? No No at 1417 on 08/07/14 by Inge Rise, CMA   Review of Systems  Constitutional: Negative for fever and unexpected weight change.  HENT: Negative for congestion, dental problem, ear pain, nosebleeds, postnasal drip, rhinorrhea, sinus pressure, sneezing, sore throat and trouble swallowing.   Eyes: Negative for redness and itching.  Respiratory: Negative for cough, chest tightness, shortness of breath and wheezing.   Cardiovascular: Positive for leg swelling. Negative for palpitations.  Gastrointestinal: Negative for nausea and vomiting.  Genitourinary: Negative for dysuria.  Musculoskeletal: Negative for joint swelling.  Skin: Negative for rash.  Neurological: Negative for headaches.  Hematological: Does not bruise/bleed easily.  Psychiatric/Behavioral: Positive for dysphoric mood. The patient is nervous/anxious.        Objective:   Physical Exam Constitutional:  Overweight female, no acute distress  HENT:  Nares patent without discharge  Oropharynx without exudate, palate and uvula are moderately elongated  Eyes:  Perrla, eomi, no scleral icterus  Neck:  No JVD, no TMG  Cardiovascular:  Normal rate, regular rhythm, no rubs or gallops.  No murmurs        Intact distal pulses  Pulmonary :  Normal breath sounds, no stridor or respiratory distress  No rales, rhonchi, or wheezing  Abdominal:  Soft, nondistended, bowel sounds present.  No tenderness noted.   Musculoskeletal:  No lower extremity edema noted.  Lymph Nodes:  No cervical lymphadenopathy noted  Skin:  No cyanosis noted  Neurologic:  Alert, appropriate, moves all 4 extremities without obvious deficit.         Assessment & Plan:

## 2014-08-07 NOTE — Assessment & Plan Note (Signed)
The patient has a long history of severe obstructive sleep apnea dating back to 2003, and has done extremely well with C Pap since that time. She is currently on a fairly new device that is working adequately, and is on a pressure of 18 cm of water. She is sleeping well with this device, and is reasonably rested in the mornings upon arising.  Her download shows excellent compliance, and good control of her AHI. I have asked her to continue on her device, and will get her referred to a new medical equipment company in the [place] area. I have also encouraged her to work aggressively on weight loss.

## 2014-08-07 NOTE — Patient Instructions (Signed)
Continue on cpap, and keep up with mask changes and supplies. Work on weight loss followup with Dr. Halford Chessman in one year.

## 2014-08-15 ENCOUNTER — Other Ambulatory Visit: Payer: Self-pay | Admitting: Internal Medicine

## 2014-08-15 DIAGNOSIS — M8580 Other specified disorders of bone density and structure, unspecified site: Secondary | ICD-10-CM

## 2014-08-15 DIAGNOSIS — M85862 Other specified disorders of bone density and structure, left lower leg: Secondary | ICD-10-CM | POA: Insufficient documentation

## 2014-08-15 DIAGNOSIS — E559 Vitamin D deficiency, unspecified: Secondary | ICD-10-CM | POA: Insufficient documentation

## 2014-08-16 ENCOUNTER — Telehealth: Payer: Self-pay | Admitting: *Deleted

## 2014-08-16 NOTE — Telephone Encounter (Signed)
Called and scheduled DEXA for pt. Tues, May 24 th at 10:15 am at [facility] office). Called pt and lvm advising her of the date and time, along with address and phone number. Be advised.

## 2014-08-21 ENCOUNTER — Inpatient Hospital Stay: Admission: RE | Admit: 2014-08-21 | Payer: Managed Care, Other (non HMO) | Source: Ambulatory Visit

## 2014-09-04 ENCOUNTER — Ambulatory Visit (INDEPENDENT_AMBULATORY_CARE_PROVIDER_SITE_OTHER)
Admission: RE | Admit: 2014-09-04 | Discharge: 2014-09-04 | Disposition: A | Discharge status: Home / Self Care | Payer: Managed Care, Other (non HMO) | Source: Ambulatory Visit | Attending: Internal Medicine | Admitting: Internal Medicine

## 2014-09-04 DIAGNOSIS — M8580 Other specified disorders of bone density and structure, unspecified site: Secondary | ICD-10-CM

## 2015-01-16 ENCOUNTER — Encounter: Payer: Self-pay | Admitting: Internal Medicine

## 2015-01-16 ENCOUNTER — Ambulatory Visit (INDEPENDENT_AMBULATORY_CARE_PROVIDER_SITE_OTHER): Payer: Managed Care, Other (non HMO) | Admitting: Internal Medicine

## 2015-01-16 ENCOUNTER — Other Ambulatory Visit (INDEPENDENT_AMBULATORY_CARE_PROVIDER_SITE_OTHER): Payer: Managed Care, Other (non HMO) | Admitting: *Deleted

## 2015-01-16 VITALS — BP 130/90 | HR 79 | Temp 98.6°F | Resp 12 | Wt 220.8 lb

## 2015-01-16 DIAGNOSIS — E039 Hypothyroidism, unspecified: Secondary | ICD-10-CM

## 2015-01-16 DIAGNOSIS — E559 Vitamin D deficiency, unspecified: Secondary | ICD-10-CM | POA: Diagnosis not present

## 2015-01-16 DIAGNOSIS — Z23 Encounter for immunization: Secondary | ICD-10-CM | POA: Diagnosis not present

## 2015-01-16 DIAGNOSIS — M8580 Other specified disorders of bone density and structure, unspecified site: Secondary | ICD-10-CM

## 2015-01-16 LAB — T4, FREE: FREE T4: 0.7 ng/dL (ref 0.60–1.60)

## 2015-01-16 LAB — TSH: TSH: 1.2 u[IU]/mL (ref 0.35–4.50)

## 2015-01-16 LAB — T3, FREE: T3, Free: 3.6 pg/mL (ref 2.3–4.2)

## 2015-01-16 NOTE — Progress Notes (Signed)
Patient ID: Mary Reyes, female   DOB: 1961/01/27, 54 y.o.   MRN: [id number]   HPI  Mary Reyes is a 54 y.o.-year-old female, returning for f/u for hypothyroidism, vitamin D deficiency and osteopenia. Last visit 6 mo ago.  Hypothyroidism: Reviewed hx: She tells me that her thyroid problem started when she found a "lump in her neck" in 2001. She saw ENT at that time, who was following the nodule, which kept growing. She remembers that she was then dx with Mononucleosis. She had a FNA of the nodule and this apparently returned positive for lymphoid tissue. It was considered that this was an enlarged lymph node possibly from mononucleosis. She did not have a thyroid U/S after this episode. She remembers that the nodule did decrease in size after prednisone.  Patient was found to be hyperthyroid during the investigation for her enlarged lymph node. She initially tried oral anti-thyroid medicines, which were not effective. She had RAI tx in 2002. She did not have the start LT4 after her RAI Tx, but became hypothyroid and had to start levothyroxine in 2008. She is not sure why she was recommended to add LT3, but she believes that it was added because of her history of depression.   She is on Levothyroxine 50 mcg on 6/7 days, and 100 mcg 1/7 days (LT4) + Cytomel 7.5 mcg (LT3), taken: - fasting - with water - separated by >30 min from b'fast  - + calcium - later in the day - + PPIs at night - + multivitamins - later in the day  I reviewed pt's thyroid tests: Lab Results  Component Value Date   TSH 1.08 07/20/2014   FREET4 0.86 07/20/2014  04/03/2014: TSH 0.92, fT4 0.97, fT3 2.8  Pt describes: - + fatigue - got B12 inj >> felt better for a little while, now still fatigue - + more numbness - on skin, "everywhere"; few years ago she was investigated for SLE and MS >> negative tests - no weight gain - + anxiety (controlled, on Lexapro)/no depression - + cold/hot intolerance - + N/V/D/C/GERD - no  dry skin - + hair loss  Pt denies feeling nodules in neck, hoarseness, dysphagia/odynophagia, SOB with lying down.  She has + FH of thyroid disorders in: thyroiditis. No FH of thyroid cancer.  + h/o radiation tx to head or neck. No recent use of iodine supplements.  She also has vitamin D deficiency: - she is on Ergocalciferol 50,000 units once a month - off the med for a while - she was off it for 6-7 months, restarted ~04/2014 mo ago Last vit D level: Component     Latest Ref Rng 07/20/2014  VITD     30.00 - 100.00 ng/mL 45.34   She also has osteopenia: - last DEXA scan 09/05/2014: L1-L4: T -1.6 RFN: T -1.3 LFN: T -1.0 FRAX MOF risk: 5.1%, Hip fx risk 0.3% - No history of fractures - No previous antiresorptive treatment  Previous endocrinologist Dr. Abel Presto [phone] - in [place].  I reviewed her chart and she also has a history of ?MS, GERD, HTN, OSA, migraines.  Father had Lieber's ds.   ROS: Constitutional: see HPI Eyes: + occas. blurry vision - 10 min episodes, no xerophthalmia ENT: + sore throat, no nodules palpated in throat, no dysphagia/odynophagia, no hoarseness Cardiovascular: no CP/SOB/palpitations/+ leg swelling Respiratory: no cough/SOB Gastrointestinal: no N/V/+ D/+ C/ + acid reflux Musculoskeletal: + muscle/joint aches Skin: no rashes, + easy bruising, + stretch marks, no  hair loss Neurological: no tremors/numbness/tingling/dizziness Psychiatric: + Both: depression/anxiety + low libido  Past Medical History  Diagnosis Date  . Arthritis   . Depression   . Allergy   . Blood in stool   . Colon polyp   . Frequent headaches   . Hypertension   . Migraine   . Thyroid disease   . OSA (obstructive sleep apnea)     CPAP   Past Surgical History  Procedure Laterality Date  . Abdominal hysterectomy  2004  . Wisdom tooth extraction     History   Social History  . Marital Status: Married    Spouse Name: N/A  . Number of Children:  3     Occupational History  . N/a   Social History Main Topics  . Smoking status: Never Smoker   . Smokeless tobacco: Not on file  . Alcohol Use: No  . Drug Use: No   Current Outpatient Prescriptions on File Prior to Visit  Medication Sig Dispense Refill  . ALPRAZolam (XANAX) 0.25 MG tablet Take 0.25 mg by mouth at bedtime as needed.   0  . calcium-vitamin D (OSCAL WITH D) 250-125 MG-UNIT per tablet Take 1 tablet by mouth daily.    . cyclobenzaprine (FLEXERIL) 10 MG tablet Take 10 mg by mouth as needed.   1  . estradiol (ESTRACE) 0.1 MG/GM vaginal cream Place 1 Applicatorful vaginally 3 (three) times a week.    . hydrochlorothiazide (MICROZIDE) 12.5 MG capsule Take 12.5 mg by mouth daily.    Marland Kitchen levocetirizine (XYZAL) 5 MG tablet Take 5 mg by mouth every evening.   3  . levothyroxine (SYNTHROID, LEVOTHROID) 50 MCG tablet Take 50 mcg by mouth. Takes two on Sunday.  3  . liothyronine (CYTOMEL) 5 MCG tablet 5 mcg. Take one and one half tablets daily.    . Multiple Vitamins-Minerals (MULTIVITAMIN WITH MINERALS) tablet Take 1 tablet by mouth daily.    . Olopatadine HCl (PATANASE NA) Place into the nose.    . pantoprazole (PROTONIX) 40 MG tablet Take 40 mg by mouth daily.    . Vitamin D, Ergocalciferol, (DRISDOL) 50000 UNITS CAPS capsule Take 50,000 Units by mouth every 30 (thirty) days.   3   No current facility-administered medications on file prior to visit.   Allergies  Allergen Reactions  . Benzoyl Peroxide   . Adhesive [Tape] Rash   Family History  Problem Relation Age of Onset  . Alcohol abuse Father     Deceased  . Stroke Maternal Grandmother   . Arthritis Paternal Grandmother   . Healthy Mother   . Depression Daughter   . Heart murmur Daughter   . Fibromyalgia Daughter   . Migraines Son   . Brain cancer Sister   . Asthma Sister   + see HPI Also:  Sisters with hypertension  Child with PDA  2 daughters with history of thyroiditis  Sister with brain tumor  PE: BP  130/90 mmHg  Pulse 79  Temp(Src) 98.6 F (37 C) (Oral)  Resp 12  Wt 220 lb 12.8 oz (100.154 kg)  SpO2 96% Body mass index is 40.37 kg/(m^2). Wt Readings from Last 3 Encounters:  01/16/15 220 lb 12.8 oz (100.154 kg)  08/07/14 221 lb 12.8 oz (100.608 kg)  07/24/14 223 lb (101.152 kg)   Constitutional: Obese, in NAD Eyes: PERRLA, EOMI, no exophthalmos ENT: moist mucous membranes, no thyromegaly, no cervical lymphadenopathy Cardiovascular: RRR, No MRG Respiratory: CTA B Gastrointestinal: abdomen soft, NT, ND, BS+ Musculoskeletal: no  deformities, strength intact in all 4 Skin: moist, warm, no rashes Neurological: no tremor with outstretched hands, DTR normal in all 4  ASSESSMENT: 1. Hypothyroidism  2. Vitamin D deficiency  3. Osteopenia  PLAN:  1. Hypothyroidism Patient with long-standing hypothyroidism, on levothyroxine and Cytomel therapy. She appears euthyroid and her thyroid tests have been consistently normal. She does not appear to have a goiter, thyroid nodules, or neck compression symptoms - We discussed about correct intake of the thyroid hormones, fasting, with water, separated by at least 30 minutes from breakfast, and separated by more than 4 hours from calcium, iron, multivitamins, acid reflux medications (PPIs). She is taking them correctly. - will check thyroid tests today: TSH, free T4, free T3 - If these are abnormal, she will need to return in 6-8 weeks for repeat labs Return in about 6 months (around 07/17/2015).  2. Vitamin D deficiency - She is now on high-dose vitamin D, presumably 50,000 units, once a month but she has not being compliant with this - We will check a vitamin D level at next visit, but I advised her to start taking ergocalciferol as prescribed  3. Osteopenia - She does not have any history of fractures - We reviewed together her previous DEXA scan results which shows osteopenia - discussed about optimizing her vitamin D level and I also  advised her to start weightbearing exercises. No medical intervention needed for now. - We can repeat the DEXA scan in 3-5 years   Office Visit on 01/16/2015  Component Date Value Ref Range Status  . TSH 01/16/2015 1.20  0.35 - 4.50 uIU/mL Final  . Free T4 01/16/2015 0.70  0.60 - 1.60 ng/dL Final  . T3, Free 01/16/2015 3.6  2.3 - 4.2 pg/mL Final   TFTs great! Will continue:Levothyroxine 50 mcg on 6/7 days, and 100 mcg 1/7 days (LT4) + Cytomel 7.5 mcg (LT3)

## 2015-01-16 NOTE — Patient Instructions (Signed)
Please continue the current thyroid hormone regimen.  Restart Ergocalciferol (high dose vitamin dose) once a month.  Please come back for a follow-up appointment in 6 months.  Please stop at the lab.

## 2015-05-15 ENCOUNTER — Encounter: Payer: Self-pay | Admitting: Internal Medicine

## 2015-05-16 ENCOUNTER — Other Ambulatory Visit: Payer: Self-pay | Admitting: *Deleted

## 2015-05-16 MED ORDER — LEVOTHYROXINE SODIUM 50 MCG PO TABS: 50.0000 ug | ORAL_TABLET | Freq: Every day | ORAL | Status: DC

## 2015-05-16 NOTE — Telephone Encounter (Signed)
Pt requested a refill via MyChart.

## 2015-07-04 ENCOUNTER — Ambulatory Visit: Payer: Managed Care, Other (non HMO) | Admitting: Internal Medicine

## 2015-07-24 ENCOUNTER — Ambulatory Visit: Payer: Managed Care, Other (non HMO) | Admitting: Endocrinology

## 2015-08-31 ENCOUNTER — Other Ambulatory Visit: Payer: Self-pay | Admitting: Internal Medicine

## 2015-10-20 ENCOUNTER — Emergency Department (HOSPITAL_COMMUNITY): Payer: Managed Care, Other (non HMO)

## 2015-10-20 ENCOUNTER — Encounter (HOSPITAL_COMMUNITY): Payer: Self-pay | Admitting: Emergency Medicine

## 2015-10-20 ENCOUNTER — Emergency Department (HOSPITAL_COMMUNITY)
Admission: EM | Admit: 2015-10-20 | Discharge: 2015-10-20 | Disposition: A | Discharge status: Home / Self Care | Payer: Managed Care, Other (non HMO) | Attending: Emergency Medicine | Admitting: Emergency Medicine

## 2015-10-20 DIAGNOSIS — S93601A Unspecified sprain of right foot, initial encounter: Secondary | ICD-10-CM | POA: Diagnosis not present

## 2015-10-20 DIAGNOSIS — S99921A Unspecified injury of right foot, initial encounter: Secondary | ICD-10-CM | POA: Diagnosis present

## 2015-10-20 DIAGNOSIS — Y929 Unspecified place or not applicable: Secondary | ICD-10-CM | POA: Insufficient documentation

## 2015-10-20 DIAGNOSIS — E039 Hypothyroidism, unspecified: Secondary | ICD-10-CM | POA: Diagnosis not present

## 2015-10-20 DIAGNOSIS — I10 Essential (primary) hypertension: Secondary | ICD-10-CM | POA: Insufficient documentation

## 2015-10-20 DIAGNOSIS — Y9301 Activity, walking, marching and hiking: Secondary | ICD-10-CM | POA: Diagnosis not present

## 2015-10-20 DIAGNOSIS — F329 Major depressive disorder, single episode, unspecified: Secondary | ICD-10-CM | POA: Diagnosis not present

## 2015-10-20 DIAGNOSIS — W1839XA Other fall on same level, initial encounter: Secondary | ICD-10-CM | POA: Diagnosis not present

## 2015-10-20 DIAGNOSIS — Y999 Unspecified external cause status: Secondary | ICD-10-CM | POA: Diagnosis not present

## 2015-10-20 MED ORDER — IBUPROFEN 200 MG PO TABS
400.0000 mg | ORAL_TABLET | Freq: Once | ORAL | Status: AC | PRN
  Administered 2015-10-20: 400 mg via ORAL
  Filled 2015-10-20: qty 2

## 2015-10-20 MED ORDER — IBUPROFEN 600 MG PO TABS: 600.0000 mg | ORAL_TABLET | Freq: Four times a day (QID) | ORAL | 0 refills | Status: DC | PRN

## 2015-10-20 MED ORDER — HYDROCODONE-ACETAMINOPHEN 5-325 MG PO TABS: 1.0000 | ORAL_TABLET | Freq: Four times a day (QID) | ORAL | 0 refills | Status: DC | PRN

## 2015-10-20 MED ORDER — HYDROCODONE-ACETAMINOPHEN 5-325 MG PO TABS
1.0000 | ORAL_TABLET | Freq: Once | ORAL | Status: AC
  Administered 2015-10-20: 1 via ORAL
  Filled 2015-10-20: qty 1

## 2015-10-20 NOTE — ED Triage Notes (Addendum)
Patient states she was walking out the door and fell. Patient caught herself by her hands on a nearby table. Patient is complaining of right foot pain. Sensory and motor within normal limits for patient. Right dorsalis pedis pulse present. Patient denies hitting head, loss of consciousness, dizziness.

## 2015-10-20 NOTE — ED Provider Notes (Signed)
[facility] DEPT Provider Note   CSN: [id number] Arrival date & time: 10/20/15  1710  First Provider Contact:  First MD Initiated Contact with Patient 10/20/15 1747    By signing my name below, I, [name], attest that this documentation has been prepared under the direction and in the presence of Louanna Vanliew Y Bruce Churilla, [place]. Electronically Signed: [name], ED Scribe. 10/20/15. 5:50 PM.     History   Chief Complaint Chief Complaint  Patient presents with  . Foot Injury    HPI Mary Reyes is a 55 y.o. female.  The history is provided by the patient. No language interpreter was used.  Foot Injury   HPI Comments: Mary Reyes is a 55 y.o. female who presents to the Emergency Department complaining of right foot pain onset today at 12:30PM. Pt states that's she was walking through a door and was falling but caught her self on a table. She states that she twisted the foot while she was falling. She reports pain along her lateral right foot. Pt states that the pain is worse when ambulating and bearing weight.  Denies swelling. She doesn't report any medications PTA. She denies head injury or LOC. Pt doesn't report numbness or tingling.   Past Medical History:  Diagnosis Date  . Allergy   . Arthritis   . Blood in stool   . Colon polyp   . Depression   . Frequent headaches   . Hypertension   . Migraine   . OSA (obstructive sleep apnea)    CPAP  . Thyroid disease     Patient Active Problem List   Diagnosis Date Noted  . Vitamin D deficiency 08/15/2014  . Osteopenia 08/15/2014  . OSA (obstructive sleep apnea) 08/07/2014  . Essential hypertension 06/27/2014  . GERD (gastroesophageal reflux disease) 06/27/2014  . Hypothyroidism 06/27/2014  . Depression 06/27/2014  . Allergic rhinitis 06/27/2014    Past Surgical History:  Procedure Laterality Date  . ABDOMINAL HYSTERECTOMY  2004  . WISDOM TOOTH EXTRACTION      OB History    No data available       Home  Medications    Prior to Admission medications   Medication Sig Start Date End Date Taking? Authorizing Provider  ALPRAZolam (XANAX) 0.25 MG tablet Take 0.25 mg by mouth at bedtime as needed.  05/21/14   Historical Provider, MD  calcium-vitamin D (OSCAL WITH D) 250-125 MG-UNIT per tablet Take 1 tablet by mouth daily.    Historical Provider, MD  cyclobenzaprine (FLEXERIL) 10 MG tablet Take 10 mg by mouth as needed.  05/29/14   Historical Provider, MD  escitalopram (LEXAPRO) 20 MG tablet Take 20 mg by mouth daily. 01/05/15   Historical Provider, MD  estradiol (ESTRACE) 0.1 MG/GM vaginal cream Place 1 Applicatorful vaginally 3 (three) times a week.    Historical Provider, MD  hydrochlorothiazide (MICROZIDE) 12.5 MG capsule Take 12.5 mg by mouth daily.    Historical Provider, MD  levocetirizine (XYZAL) 5 MG tablet Take 5 mg by mouth every evening.  05/09/14   Historical Provider, MD  levothyroxine (SYNTHROID, LEVOTHROID) 50 MCG tablet TAKE 1 TABLET BY MOUTH BEFORE BREAKFAST AND TAKE 2 TABLETS ON SUNDAY 09/02/15   Philemon Kingdom, MD  liothyronine (CYTOMEL) 5 MCG tablet 5 mcg. Take one and one half tablets daily.    Historical Provider, MD  Multiple Vitamins-Minerals (MULTIVITAMIN WITH MINERALS) tablet Take 1 tablet by mouth daily.    Historical Provider, MD  Olopatadine HCl (PATANASE NA) Place into the  nose.    Historical Provider, MD  pantoprazole (PROTONIX) 40 MG tablet Take 40 mg by mouth daily.    Historical Provider, MD  Vitamin D, Ergocalciferol, (DRISDOL) 50000 UNITS CAPS capsule Take 50,000 Units by mouth every 30 (thirty) days.  05/21/14   Historical Provider, MD    Family History Family History  Problem Relation Age of Onset  . Healthy Mother   . Alcohol abuse Father     Deceased  . Stroke Maternal Grandmother   . Arthritis Paternal Grandmother   . Depression Daughter   . Heart murmur Daughter   . Fibromyalgia Daughter   . Migraines Son   . Brain cancer Sister   . Asthma Sister      Social History Social History  Substance Use Topics  . Smoking status: Never Smoker  . Smokeless tobacco: Never Used  . Alcohol use No     Allergies   Benzoyl peroxide and Adhesive [tape]   Review of Systems Review of Systems  Musculoskeletal: Positive for arthralgias.  Neurological: Negative for numbness.  10 Systems reviewed and are negative for acute change except as noted in the HPI.    Physical Exam Updated Vital Signs BP 151/82 (BP Location: Left Arm)   Pulse 77   Temp 98.3 F (36.8 C) (Oral)   Resp 18   Ht 5\' 2"  (1.575 m)   Wt 234 lb (106.1 kg)   SpO2 97%   BMI 42.80 kg/m   Physical Exam  Constitutional: She is oriented to person, place, and time. She appears well-developed and well-nourished. No distress.  HENT:  Head: Normocephalic and atraumatic.  Eyes: Conjunctivae and EOM are normal.  Neck: Neck supple. No tracheal deviation present.  Cardiovascular: Normal rate.   Pulmonary/Chest: Effort normal. No respiratory distress.  Musculoskeletal: Normal range of motion.  R lateral foot with tenderness along entire lateral edge extending to mid dorsum. Minimal edema. No discoloration. FROM of ankle. Sensation intact. Brisk cap refill x 5.   Neurological: She is alert and oriented to person, place, and time.  Skin: Skin is warm and dry.  Psychiatric: She has a normal mood and affect. Her behavior is normal.  Nursing note and vitals reviewed.    ED Treatments / Results  Labs (all labs ordered are listed, but only abnormal results are displayed) Labs Reviewed - No data to display  EKG  EKG Interpretation None       Radiology Dg Foot Complete Right  Result Date: 10/20/2015 CLINICAL DATA:  Fall, lateral/dorsal foot pain EXAM: RIGHT FOOT COMPLETE - 3+ VIEW COMPARISON:  None. FINDINGS: No fracture or dislocation is seen. The joint spaces are preserved. The visualized soft tissues are unremarkable. IMPRESSION: No fracture or dislocation is seen.  Electronically Signed   By: Julian Hy M.D.   On: 10/20/2015 17:42   Procedures Procedures (including critical care time)  Medications Ordered in ED Medications  ibuprofen (ADVIL,MOTRIN) tablet 400 mg (400 mg Oral Given 10/20/15 1724)     Initial Impression / Assessment and Plan / ED Course  I have reviewed the triage vital signs and the nursing notes.  Pertinent labs & imaging results that were available during my care of the patient were reviewed by me and considered in my medical decision making (see chart for details).  Clinical Course    X-ray negative for acute findings. Likely foot strain/sprain. She is neurovascularly intact. It is difficult for her to bear weight due to degree of pain but she has a wheelchair  at home and her husband will be able to help her get around. Will d/c with rx for norco and ibuprofen as needed. Encouraged ortho f/u. ER return precautions given.   Final Clinical Impressions(s) / ED Diagnoses   Final diagnoses:  Right foot sprain, initial encounter    New Prescriptions Discharge Medication List as of 10/20/2015  6:25 PM    START taking these medications   Details  HYDROcodone-acetaminophen (NORCO/VICODIN) 5-325 MG tablet Take 1 tablet by mouth every 6 (six) hours as needed for severe pain., Starting Sun 10/20/2015, Print    ibuprofen (ADVIL,MOTRIN) 600 MG tablet Take 1 tablet (600 mg total) by mouth every 6 (six) hours as needed., Starting Sun 10/20/2015, Print        I personally performed the services described in this documentation, which was scribed in my presence. The recorded information has been reviewed and is accurate.    Anne Ng, PA-C 10/20/15 Darcus Austin, MD 10/22/15 [phone]

## 2015-10-20 NOTE — Discharge Instructions (Signed)
Your x-ray today showed no acute abnormalities. You likely strained/sprained your foot. We helped you apply an ACE wrap today. Take medications as prescribed as needed for pain. Keep your foot elevated and apply ice on and off for the next 24-48 hrs to help with pain and swelling. Follow up with your primary care provider this week. Call Dr. Berenice Primas' office to schedule an orthopedic follow up if your symptoms persist.

## 2015-10-28 ENCOUNTER — Encounter: Payer: Self-pay | Admitting: Pulmonary Disease

## 2015-10-28 ENCOUNTER — Ambulatory Visit (INDEPENDENT_AMBULATORY_CARE_PROVIDER_SITE_OTHER): Payer: Managed Care, Other (non HMO) | Admitting: Pulmonary Disease

## 2015-10-28 VITALS — BP 128/78 | HR 72 | Ht 62.0 in | Wt 239.0 lb

## 2015-10-28 DIAGNOSIS — Z6841 Body Mass Index (BMI) 40.0 and over, adult: Secondary | ICD-10-CM | POA: Diagnosis not present

## 2015-10-28 DIAGNOSIS — G4733 Obstructive sleep apnea (adult) (pediatric): Secondary | ICD-10-CM | POA: Diagnosis not present

## 2015-10-28 NOTE — Patient Instructions (Signed)
Call if snoring gets worse  Follow up in 1 year

## 2015-10-28 NOTE — Progress Notes (Signed)
Current Outpatient Prescriptions on File Prior to Visit  Medication Sig  . ALPRAZolam (XANAX) 0.25 MG tablet Take 0.25 mg by mouth at bedtime as needed.   . calcium-vitamin D (OSCAL WITH D) 250-125 MG-UNIT per tablet Take 1 tablet by mouth daily.  . cyclobenzaprine (FLEXERIL) 10 MG tablet Take 10 mg by mouth as needed.   Marland Kitchen escitalopram (LEXAPRO) 20 MG tablet Take 20 mg by mouth daily.  Marland Kitchen estradiol (ESTRACE) 0.1 MG/GM vaginal cream Place 1 Applicatorful vaginally 3 (three) times a week.  . hydrochlorothiazide (MICROZIDE) 12.5 MG capsule Take 12.5 mg by mouth daily.  Marland Kitchen HYDROcodone-acetaminophen (NORCO/VICODIN) 5-325 MG tablet Take 1 tablet by mouth every 6 (six) hours as needed for severe pain.  Marland Kitchen ibuprofen (ADVIL,MOTRIN) 600 MG tablet Take 1 tablet (600 mg total) by mouth every 6 (six) hours as needed.  Marland Kitchen levocetirizine (XYZAL) 5 MG tablet Take 5 mg by mouth every evening.   Marland Kitchen levothyroxine (SYNTHROID, LEVOTHROID) 50 MCG tablet TAKE 1 TABLET BY MOUTH BEFORE BREAKFAST AND TAKE 2 TABLETS ON SUNDAY  . liothyronine (CYTOMEL) 5 MCG tablet 5 mcg. Take one and one half tablets daily.  . Multiple Vitamins-Minerals (MULTIVITAMIN WITH MINERALS) tablet Take 1 tablet by mouth daily.  . Olopatadine HCl (PATANASE NA) Place into the nose.  . pantoprazole (PROTONIX) 40 MG tablet Take 40 mg by mouth daily.  . Vitamin D, Ergocalciferol, (DRISDOL) 50000 UNITS CAPS capsule Take 50,000 Units by mouth every 30 (thirty) days.    No current facility-administered medications on file prior to visit.      Chief Complaint  Patient presents with  . Follow-up    Former [facility] pt. Pt states that she has not been doing well with CPAP for several months. She states that in general she has not been feeling well and thinks there is more than OSA going on. Pt reports increased snoring, fatigue, muscle spasms, lightheaded/dizziness, numbness/tingling in face and significant reduction in stamina. Pt reports issues with her left hand  ring finger joints locking. Pt reports that she felt significantly better one year ago.     Sleep tests PSG 2003 >> AHI 57 CPAP 07/30/15 to 10/27/15 >> used on 90 of 90 nights with average 7 hrs 45.  Average AHI 5.1 with CPAP 18 cm H2O  Past medical hx Allergies, Depression, HTN, Migraines, Hypothyroidism  Past surgical hx, Allergies, Family hx, Social hx all reviewed.  Vital Signs BP 128/78 (BP Location: Left Arm, Cuff Size: Normal)   Pulse 72   Ht 5\' 2"  (1.575 m)   Wt 239 lb (108.4 kg)   SpO2 95%   BMI 43.71 kg/m   History of Present Illness Mary Reyes is a 55 y.o. female with obstructive sleep apnea.  She had original sleep study in [place].  This was done several years ago.  She has been using CPAP nightly.  She feels it works fine.  Her husband is concerned that she snores still.  She has nasal pillows mask.  She has tried nasal mask and full face mask >> didn't like fit.  Physical Exam  General - No distress ENT - No sinus tenderness, no oral exudate, no LAN, MP 4, enlarged tongue Cardiac - s1s2 regular, no murmur Chest - No wheeze/rales/dullness Back - No focal tenderness Abd - Soft, non-tender Ext - No edema Neuro - Normal strength Skin - No rashes Psych - normal mood, and behavior   Assessment/Plan  Obstructive sleep apnea. - she is compliant with CPAP and feels  this helps - continue CPAP 18 cm H2O - she is to call if her sleep gets worse >> then consider change mask, repeat sleep study, adjust CPAP pressure, get new machine  Obesity. - discussed importance of weight loss   Patient Instructions  Call if snoring gets worse  Follow up in 1 year    Chesley Mires, MD [facility] Pager:  [phone] 10/28/2015, 3:43 PM

## 2015-11-04 ENCOUNTER — Other Ambulatory Visit: Payer: Self-pay | Admitting: Internal Medicine

## 2015-11-04 ENCOUNTER — Telehealth: Payer: Self-pay | Admitting: Internal Medicine

## 2015-11-04 NOTE — Telephone Encounter (Signed)
Patient ask if she can get a refill of medication liothyronine (CYTOMEL) 5 MCG tablet until her appointment, please advise [id number] - [place], [place] [address] [phone] (Phone) [phone] (Fax)

## 2015-11-06 ENCOUNTER — Other Ambulatory Visit: Payer: Self-pay

## 2015-11-06 MED ORDER — LIOTHYRONINE SODIUM 5 MCG PO TABS: ORAL_TABLET | ORAL | 0 refills | Status: DC

## 2015-11-06 NOTE — Telephone Encounter (Signed)
PT needs enough of Cytomel to hold her over to her appt sent to [facility]

## 2015-11-06 NOTE — Telephone Encounter (Signed)
Sent in rx for cytomel to pharmacy per patient until her appointment date, sent in enough to last her until then.

## 2015-12-05 ENCOUNTER — Other Ambulatory Visit: Payer: Self-pay

## 2015-12-05 MED ORDER — LIOTHYRONINE SODIUM 5 MCG PO TABS: ORAL_TABLET | ORAL | 0 refills | Status: DC

## 2015-12-09 ENCOUNTER — Other Ambulatory Visit: Payer: Self-pay

## 2015-12-09 MED ORDER — LIOTHYRONINE SODIUM 5 MCG PO TABS: ORAL_TABLET | ORAL | 3 refills | Status: DC

## 2015-12-11 ENCOUNTER — Ambulatory Visit (INDEPENDENT_AMBULATORY_CARE_PROVIDER_SITE_OTHER): Payer: Managed Care, Other (non HMO) | Admitting: Internal Medicine

## 2015-12-11 ENCOUNTER — Encounter: Payer: Self-pay | Admitting: Internal Medicine

## 2015-12-11 VITALS — BP 140/84 | HR 79 | Wt 241.0 lb

## 2015-12-11 DIAGNOSIS — R208 Other disturbances of skin sensation: Secondary | ICD-10-CM | POA: Diagnosis not present

## 2015-12-11 DIAGNOSIS — E039 Hypothyroidism, unspecified: Secondary | ICD-10-CM | POA: Diagnosis not present

## 2015-12-11 DIAGNOSIS — E559 Vitamin D deficiency, unspecified: Secondary | ICD-10-CM

## 2015-12-11 LAB — VITAMIN B12: Vitamin B-12: 447 pg/mL (ref 211–911)

## 2015-12-11 NOTE — Progress Notes (Signed)
Patient ID: Mary Reyes, female   DOB: 1960/11/22, 55 y.o.   MRN: [id number]   HPI  Mary Reyes is a 55 y.o.-year-old female, returning for f/u for hypothyroidism, vitamin D deficiency and osteopenia. Last visit 6 mo ago.  Hypothyroidism: Reviewed hx: She tells me that her thyroid problem started when she found a "lump in her neck" in 2001. She saw ENT at that time, who was following the nodule, which kept growing. She remembers that she was then dx with Mononucleosis. She had a FNA of the nodule and this apparently returned positive for lymphoid tissue. It was considered that this was an enlarged lymph node possibly from mononucleosis. She did not have a thyroid U/S after this episode. She remembers that the nodule did decrease in size after prednisone.  Patient was found to be hyperthyroid during the investigation for her enlarged lymph node. She initially tried oral anti-thyroid medicines, which were not effective. She had RAI tx in 2002. She did not have the start LT4 after her RAI Tx, but became hypothyroid and had to start levothyroxine in 2008. She is not sure why she was recommended to add LT3, but she believes that it was added because of her history of depression.   She is on Levothyroxine 50 mcg on 6/7 days, and 100 mcg 1/7 days (LT4) + Cytomel 7.5 mcg (LT3), taken: - fasting - with water - separated by >30 min from b'fast  - + calcium - later in the day - + PPIs at night >> moved this in am 2 weeks ago at the advice of her pharmacist!!!! >> she feels much worse since then - + multivitamins - later in the day  I reviewed pt's thyroid tests: Lab Results  Component Value Date   TSH 1.20 01/16/2015   TSH 1.08 07/20/2014   FREET4 0.70 01/16/2015   FREET4 0.86 07/20/2014  04/03/2014: TSH 0.92, fT4 0.97, fT3 2.8  Pt describes: - + fatigue - got 1x B12 inj >> No significant effect on her fatigue - + dysesthesia  - tested for Lyme ds in the past + more numbness - on skin,  "everywhere"; few years ago she was investigated for SLE and MS >> negative tests - + weight gain - + anxiety (controlled, on Lexapro)/no depression - + cold/hot intolerance - no N/V/D/+ C - no dry skin - + hair loss  Pt denies feeling nodules in neck, hoarseness, dysphagia/odynophagia, SOB with lying down.  She has + FH of thyroid disorders in: thyroiditis. No FH of thyroid cancer.  + h/o radiation tx to head or neck. No recent use of iodine supplements.  She also has vitamin D deficiency: - she is on Ergocalciferol 50,000 units once a month - forgets it  Last vit D level: Lab Results  Component Value Date   VD25OH 45.34 07/20/2014   She also has osteopenia: - last DEXA scan 09/05/2014: L1-L4: T -1.6 RFN: T -1.3 LFN: T -1.0 FRAX MOF risk: 5.1%, Hip fx risk 0.3% - No history of fractures - No previous antiresorptive treatment  Previous endocrinologist Dr. Abel Presto [phone] - in [place].  I reviewed her chart and she also has a history of ?MS, GERD, HTN, OSA, migraines.  Father had Lieber's ds.   ROS: Constitutional: see HPI, + Nocturia Eyes: no blurry vision, no xerophthalmia ENT: no sore throat, no nodules palpated in throat, no dysphagia/odynophagia, no hoarseness Cardiovascular: no CP/SOB/+ palpitations/+ leg swelling Respiratory: no cough/SOB Gastrointestinal: no N/V/+ D/+ C/ + acid  reflux Musculoskeletal: + muscle/+ joint aches Skin: + rash, +  hair loss Neurological: no tremors/numbness/tingling/dizziness  I reviewed pt's medications, allergies, PMH, social hx, family hx, and changes were documented in the history of present illness. Otherwise, unchanged from my initial visit note.  Past Medical History:  Diagnosis Date  . Allergy   . Arthritis   . Blood in stool   . Colon polyp   . Depression   . Frequent headaches   . Hypertension   . Migraine   . OSA (obstructive sleep apnea)    CPAP  . Thyroid disease    Past Surgical History:   Procedure Laterality Date  . ABDOMINAL HYSTERECTOMY  2004  . WISDOM TOOTH EXTRACTION     History   Social History  . Marital Status: Married    Spouse Name: N/A  . Number of Children:  3    Occupational History  . N/a   Social History Main Topics  . Smoking status: Never Smoker   . Smokeless tobacco: Not on file  . Alcohol Use: No  . Drug Use: No   Current Outpatient Prescriptions on File Prior to Visit  Medication Sig Dispense Refill  . ALPRAZolam (XANAX) 0.25 MG tablet Take 0.25 mg by mouth at bedtime as needed.   0  . buPROPion (WELLBUTRIN) 75 MG tablet Take 1 tablet by mouth 2 (two) times daily.    . calcium-vitamin D (OSCAL WITH D) 250-125 MG-UNIT per tablet Take 1 tablet by mouth daily.    . cyclobenzaprine (FLEXERIL) 10 MG tablet Take 10 mg by mouth as needed.   1  . escitalopram (LEXAPRO) 20 MG tablet Take 20 mg by mouth daily.   4  . estradiol (ESTRACE) 0.1 MG/GM vaginal cream Place 1 Applicatorful vaginally 3 (three) times a week.    . fluticasone (FLONASE) 50 MCG/ACT nasal spray Place 2 sprays into both nostrils daily.    . hydrochlorothiazide (MICROZIDE) 12.5 MG capsule Take 12.5 mg by mouth daily.    Marland Kitchen levocetirizine (XYZAL) 5 MG tablet Take 5 mg by mouth every evening.   3  . levothyroxine (SYNTHROID, LEVOTHROID) 50 MCG tablet TAKE 1 TABLET BY MOUTH BEFORE BREAKFAST AND TAKE 2 TABLETS ON SUNDAY 102 tablet 0  . liothyronine (CYTOMEL) 5 MCG tablet Take one and one half tablets daily. 135 tablet 3  . Multiple Vitamins-Minerals (MULTIVITAMIN WITH MINERALS) tablet Take 1 tablet by mouth daily.    . Olopatadine HCl (PATANASE NA) Place into the nose.    . pantoprazole (PROTONIX) 40 MG tablet Take 40 mg by mouth daily.    . Vitamin D, Ergocalciferol, (DRISDOL) 50000 UNITS CAPS capsule Take 50,000 Units by mouth every 30 (thirty) days.   3  . ibuprofen (ADVIL,MOTRIN) 600 MG tablet Take 1 tablet (600 mg total) by mouth every 6 (six) hours as needed. (Patient not taking:  Reported on 12/11/2015) 30 tablet 0   No current facility-administered medications on file prior to visit.    Allergies  Allergen Reactions  . Benzoyl Peroxide   . Adhesive [Tape] Rash   Family History  Problem Relation Age of Onset  . Healthy Mother   . Alcohol abuse Father     Deceased  . Stroke Maternal Grandmother   . Arthritis Paternal Grandmother   . Depression Daughter   . Heart murmur Daughter   . Fibromyalgia Daughter   . Migraines Son   . Brain cancer Sister   . Asthma Sister   + see HPI Also:  Sisters with hypertension  Child with PDA  2 daughters with history of thyroiditis  Sister with brain tumor  PE: BP 140/84 (BP Location: Left Arm, Patient Position: Sitting)   Pulse 79   Wt 241 lb (109.3 kg)   SpO2 96%   BMI 44.08 kg/m  Body mass index is 44.08 kg/m. Wt Readings from Last 3 Encounters:  12/11/15 241 lb (109.3 kg)  10/28/15 239 lb (108.4 kg)  10/20/15 234 lb (106.1 kg)   Constitutional: Obese, in NAD Eyes: PERRLA, EOMI, no exophthalmos ENT: moist mucous membranes, no thyromegaly, no cervical lymphadenopathy Cardiovascular: RRR, No MRG Respiratory: CTA B Gastrointestinal: abdomen soft, NT, ND, BS+ Musculoskeletal: no deformities, strength intact in all 4 Skin: moist, warm, no rashes Neurological: no tremor with outstretched hands, DTR normal in all 4  ASSESSMENT: 1. Hypothyroidism  2. Vitamin D deficiency  3. Osteopenia  4. Dysesthesia  PLAN:  1. Hypothyroidism Patient with long-standing hypothyroidism, on levothyroxine and Cytomel therapy. She feels very fatigued and has gained weight, and her symptoms exacerbated after she moved the PPI in a.m., close to her thyroid hormones. I strongly advised her to move it back to bedtime. Since she only started to take the PPI in a.m. 2 weeks ago, we need to wait 6 weeks before we check her thyroid tests again after starting to take the thyroid medication correctly - We discussed about correct  intake of the thyroid hormones, fasting, with water, separated by at least 30 minutes from breakfast, and separated by more than 4 hours from calcium, iron, multivitamins, acid reflux medications (PPIs).  - She does not appear to have a goiter, thyroid nodules, or neck compression symptoms - will check thyroid tests: TSH, free T4, free T3 - in 6-8 weeks for repeat labs - Will continue:Levothyroxine 50 mcg on 6/7 days, and 100 mcg 1/7 days (LT4) + Cytomel 7.5 mcg (LT3) for now  2. Vitamin D deficiency - She is now on high-dose vitamin D, presumably 50,000 units, once a month but she has not being compliant with this >> she agrees to try 2000 units Vit D3 daily. - We will check a vitamin D level in 2 mo  3. Osteopenia - She does not have any history of fractures - we are optimizing her vitamin D level  - No medical intervention needed for now. - We can repeat the DEXA scan after 08/2016   4. Dysesthesia - long-standing - Lyme ds and rheumatologic w/u was negative in the past - associated with fatigue, poor response to B12 inj  >> will check B12, MMA, MTHFR DNA analysis - today  Component     Latest Ref Rng & Units 12/11/2015  MTHFR      C677T Single mutation (C677T) identified  Vitamin B12     211 - 911 pg/mL 447  Methylmalonic Acid, Quant     87 - 318 nmol/L 111  Vitamin B12 and MMA normal.  Pt is heterozygous for the C667T MTHFR mutation >> This is associated with a ~30% decrease mutilation activity. Will suggest to start Methylated B vitamins and folic acid. Will also check with her if she agrees for a referral to genetics.   Philemon Kingdom, MD PhD [facility]

## 2015-12-11 NOTE — Patient Instructions (Addendum)
Please stop at the lab.  Please move the Protonix at night.  Please continue Levothyroxine 50 mcg on 6/7 days, and 100 mcg 1/7 days + Cytomel 7.5 mcg.  Stop Ergocalciferol and start vitamin D3 2000 units daily.  Please come back in 6-8 weeks for recheck of labs.

## 2015-12-13 LAB — METHYLMALONIC ACID, SERUM: METHYLMALONIC ACID, QUANT: 111 nmol/L (ref 87–318)

## 2015-12-19 LAB — MTHFR DNA ANALYSIS

## 2015-12-26 ENCOUNTER — Encounter: Payer: Self-pay | Admitting: Internal Medicine

## 2015-12-26 ENCOUNTER — Other Ambulatory Visit: Payer: Self-pay | Admitting: Internal Medicine

## 2015-12-26 DIAGNOSIS — Z1589 Genetic susceptibility to other disease: Secondary | ICD-10-CM

## 2015-12-26 DIAGNOSIS — E7212 Methylenetetrahydrofolate reductase deficiency: Secondary | ICD-10-CM

## 2016-01-20 ENCOUNTER — Encounter: Payer: Self-pay | Admitting: Internal Medicine

## 2016-01-21 ENCOUNTER — Other Ambulatory Visit: Payer: Self-pay | Admitting: Internal Medicine

## 2016-01-21 ENCOUNTER — Encounter: Payer: Self-pay | Admitting: Internal Medicine

## 2016-01-21 DIAGNOSIS — E7212 Methylenetetrahydrofolate reductase deficiency: Secondary | ICD-10-CM

## 2016-01-21 DIAGNOSIS — Z1589 Genetic susceptibility to other disease: Secondary | ICD-10-CM | POA: Insufficient documentation

## 2016-01-28 ENCOUNTER — Ambulatory Visit: Payer: Self-pay | Admitting: Allergy and Immunology

## 2016-02-03 ENCOUNTER — Ambulatory Visit: Payer: Managed Care, Other (non HMO)

## 2016-02-03 DIAGNOSIS — E039 Hypothyroidism, unspecified: Secondary | ICD-10-CM

## 2016-02-03 DIAGNOSIS — E559 Vitamin D deficiency, unspecified: Secondary | ICD-10-CM

## 2016-02-03 LAB — TSH: TSH: 0.89 u[IU]/mL (ref 0.35–4.50)

## 2016-02-03 LAB — VITAMIN D 25 HYDROXY (VIT D DEFICIENCY, FRACTURES): VITD: 42.23 ng/mL (ref 30.00–100.00)

## 2016-02-03 LAB — T4, FREE: FREE T4: 0.84 ng/dL (ref 0.60–1.60)

## 2016-02-04 ENCOUNTER — Other Ambulatory Visit: Payer: Self-pay | Admitting: Internal Medicine

## 2016-02-08 ENCOUNTER — Emergency Department (HOSPITAL_COMMUNITY): Payer: Managed Care, Other (non HMO)

## 2016-02-08 ENCOUNTER — Emergency Department (HOSPITAL_COMMUNITY)
Admission: EM | Admit: 2016-02-08 | Discharge: 2016-02-08 | Disposition: A | Discharge status: Home / Self Care | Payer: Managed Care, Other (non HMO) | Attending: Emergency Medicine | Admitting: Emergency Medicine

## 2016-02-08 ENCOUNTER — Encounter (HOSPITAL_COMMUNITY): Payer: Self-pay | Admitting: *Deleted

## 2016-02-08 DIAGNOSIS — S82892A Other fracture of left lower leg, initial encounter for closed fracture: Secondary | ICD-10-CM

## 2016-02-08 DIAGNOSIS — Y999 Unspecified external cause status: Secondary | ICD-10-CM | POA: Insufficient documentation

## 2016-02-08 DIAGNOSIS — Z79899 Other long term (current) drug therapy: Secondary | ICD-10-CM | POA: Diagnosis not present

## 2016-02-08 DIAGNOSIS — S99912A Unspecified injury of left ankle, initial encounter: Secondary | ICD-10-CM | POA: Diagnosis present

## 2016-02-08 DIAGNOSIS — S8252XA Displaced fracture of medial malleolus of left tibia, initial encounter for closed fracture: Secondary | ICD-10-CM | POA: Insufficient documentation

## 2016-02-08 DIAGNOSIS — Y929 Unspecified place or not applicable: Secondary | ICD-10-CM | POA: Insufficient documentation

## 2016-02-08 DIAGNOSIS — E039 Hypothyroidism, unspecified: Secondary | ICD-10-CM | POA: Diagnosis not present

## 2016-02-08 DIAGNOSIS — W108XXA Fall (on) (from) other stairs and steps, initial encounter: Secondary | ICD-10-CM | POA: Diagnosis not present

## 2016-02-08 DIAGNOSIS — S82432A Displaced oblique fracture of shaft of left fibula, initial encounter for closed fracture: Secondary | ICD-10-CM | POA: Insufficient documentation

## 2016-02-08 DIAGNOSIS — I10 Essential (primary) hypertension: Secondary | ICD-10-CM | POA: Insufficient documentation

## 2016-02-08 DIAGNOSIS — Y939 Activity, unspecified: Secondary | ICD-10-CM | POA: Diagnosis not present

## 2016-02-08 MED ORDER — MIDAZOLAM HCL 2 MG/2ML IJ SOLN
2.0000 mg | Freq: Once | INTRAMUSCULAR | Status: AC
  Administered 2016-02-08: 2 mg via INTRAVENOUS

## 2016-02-08 MED ORDER — MIDAZOLAM HCL 2 MG/2ML IJ SOLN
4.0000 mg | Freq: Once | INTRAMUSCULAR | Status: DC
  Filled 2016-02-08: qty 4

## 2016-02-08 MED ORDER — HYDROMORPHONE HCL 1 MG/ML IJ SOLN
1.0000 mg | Freq: Once | INTRAMUSCULAR | Status: AC
Start: 2016-02-08 — End: 2016-02-08
  Administered 2016-02-08: 1 mg via INTRAVENOUS
  Filled 2016-02-08: qty 1

## 2016-02-08 MED ORDER — OXYCODONE-ACETAMINOPHEN 5-325 MG PO TABS
1.0000 | ORAL_TABLET | Freq: Once | ORAL | Status: AC
  Administered 2016-02-08: 1 via ORAL
  Filled 2016-02-08: qty 1

## 2016-02-08 MED ORDER — ETOMIDATE 2 MG/ML IV SOLN
10.0000 mg | Freq: Once | INTRAVENOUS | Status: AC
  Administered 2016-02-08: 10 mg via INTRAVENOUS
  Filled 2016-02-08: qty 10

## 2016-02-08 MED ORDER — OXYCODONE-ACETAMINOPHEN 5-325 MG PO TABS: 1.0000 | ORAL_TABLET | ORAL | 0 refills | Status: DC | PRN

## 2016-02-08 NOTE — Discharge Instructions (Signed)
Take the prescribed medication as directed.  Use caution, it can make you sleepy. Keep ankle elevated at home to help with swelling. Follow-up with Dr. Erlinda Hong on Monday-- his office will call you with your appt. Time. Return to the ED for new or worsening symptoms.

## 2016-02-08 NOTE — ED Notes (Signed)
Patient is alert and oriented, answers and asks questions.  Patient tolerated reduction well.

## 2016-02-08 NOTE — ED Triage Notes (Signed)
Pt brought in by EMS, Twisted left ankle going down steps at home, pt did go down to ground. No other injury. IV 22 left hand, 50 mcg Fentanyl en route. Left ankle splint due to angulation.

## 2016-02-08 NOTE — ED Notes (Signed)
Patient Consent for reduction signed 1710. Present in room: Dr. Lacinda Axon, Lattie Haw PA, Charlean Sanfilippo, Colletta Maryland RN, Angie RT, [facility].  Time Out 1725. 2mg  Versed 1727 10 mg atomidate 1728 2mg  Versed 1729 Reduction 1730 Set the ankle 1731

## 2016-02-08 NOTE — ED Notes (Signed)
Bed: WA17 Expected date:  Expected time:  Means of arrival:  Comments: 55 yo ankle injury

## 2016-02-08 NOTE — ED Notes (Signed)
[facility] patients husband, phone # [phone]

## 2016-02-08 NOTE — ED Provider Notes (Signed)
[facility] DEPT Provider Note   CSN: [id number] Arrival date & time: 02/08/16  1516     History   Chief Complaint Chief Complaint  Patient presents with  . Ankle Pain    injury    HPI Mary Reyes is a 55 y.o. female.  The history is provided by the patient and medical records.  Ankle Pain     55 year old female with history of arthritis, depression, hypertension, migraines, thyroid disease, sleep apnea, presenting to the ED after a fall. Patient has been was wiping leads off of their back porch when she went outside to let the dogs out. States she stepped down off the ledge into a wet spot and twisted her left ankle causing her to fall to the ground. There was no head injury loss of consciousness. States she had immediate onset of pain in her left ankle as well as a noted deformity. She did not try to get up or ambulate after the fall. Husband probably called EMS who transported her here. On arrival she does have a noted deformity of the medial left ankle. She has no prior left ankle injuries or surgeries in the past. She is not currently established with an orthopedist. She was given pain medication and route with improvement of her symptoms. She denies any numbness or weakness of her left leg or foot. She is not currently on any type of anticoagulation.  Past Medical History:  Diagnosis Date  . Allergy   . Arthritis   . Blood in stool   . Colon polyp   . Depression   . Frequent headaches   . Hypertension   . Migraine   . OSA (obstructive sleep apnea)    CPAP  . Thyroid disease     Patient Active Problem List   Diagnosis Date Noted  . Heterozygous MTHFR mutation C677T ([place]) 01/21/2016  . Vitamin D deficiency 08/15/2014  . Osteopenia 08/15/2014  . OSA (obstructive sleep apnea) 08/07/2014  . Essential hypertension 06/27/2014  . GERD (gastroesophageal reflux disease) 06/27/2014  . Hypothyroidism 06/27/2014  . Depression 06/27/2014  . Allergic rhinitis 06/27/2014     Past Surgical History:  Procedure Laterality Date  . ABDOMINAL HYSTERECTOMY  2004  . WISDOM TOOTH EXTRACTION      OB History    No data available       Home Medications    Prior to Admission medications   Medication Sig Start Date End Date Taking? Authorizing Provider  ALPRAZolam (XANAX) 0.25 MG tablet Take 0.25 mg by mouth at bedtime as needed.  05/21/14   Historical Provider, MD  buPROPion (WELLBUTRIN) 75 MG tablet Take 1 tablet by mouth 2 (two) times daily. 09/10/15   Historical Provider, MD  calcium-vitamin D (OSCAL WITH D) 250-125 MG-UNIT per tablet Take 1 tablet by mouth daily.    Historical Provider, MD  cyclobenzaprine (FLEXERIL) 10 MG tablet Take 10 mg by mouth as needed.  05/29/14   Historical Provider, MD  escitalopram (LEXAPRO) 20 MG tablet Take 20 mg by mouth daily.  01/05/15   Historical Provider, MD  estradiol (ESTRACE) 0.1 MG/GM vaginal cream Place 1 Applicatorful vaginally 3 (three) times a week.    Historical Provider, MD  fluticasone (FLONASE) 50 MCG/ACT nasal spray Place 2 sprays into both nostrils daily. 09/23/15   Historical Provider, MD  hydrochlorothiazide (MICROZIDE) 12.5 MG capsule Take 12.5 mg by mouth daily.    Historical Provider, MD  ibuprofen (ADVIL,MOTRIN) 600 MG tablet Take 1 tablet (600 mg total) by mouth  every 6 (six) hours as needed. Patient not taking: Reported on 12/11/2015 10/20/15   Olivia Canter Sam, PA-C  levocetirizine (XYZAL) 5 MG tablet Take 5 mg by mouth every evening.  05/09/14   Historical Provider, MD  levothyroxine (SYNTHROID, LEVOTHROID) 50 MCG tablet TAKE 1 TABLET BY MOUTH BEFORE BREAKFAST AND TAKE 2 TABLETS ON SUNDAY 02/04/16   Philemon Kingdom, MD  liothyronine (CYTOMEL) 5 MCG tablet Take one and one half tablets daily. 12/09/15   Philemon Kingdom, MD  Multiple Vitamins-Minerals (MULTIVITAMIN WITH MINERALS) tablet Take 1 tablet by mouth daily.    Historical Provider, MD  Olopatadine HCl (PATANASE NA) Place into the nose.    Historical  Provider, MD  pantoprazole (PROTONIX) 40 MG tablet Take 40 mg by mouth daily.    Historical Provider, MD    Family History Family History  Problem Relation Age of Onset  . Healthy Mother   . Alcohol abuse Father     Deceased  . Stroke Maternal Grandmother   . Arthritis Paternal Grandmother   . Depression Daughter   . Heart murmur Daughter   . Fibromyalgia Daughter   . Migraines Son   . Brain cancer Sister   . Asthma Sister     Social History Social History  Substance Use Topics  . Smoking status: Never Smoker  . Smokeless tobacco: Never Used  . Alcohol use No     Allergies   Benzoyl peroxide and Adhesive [tape]   Review of Systems Review of Systems  Musculoskeletal: Positive for arthralgias.  All other systems reviewed and are negative.    Physical Exam Updated Vital Signs BP 117/63 (BP Location: Right Arm)   Pulse 73   Temp 98.7 F (37.1 C) (Oral)   Resp 18   SpO2 99%   Physical Exam  Constitutional: She is oriented to person, place, and time. She appears well-developed and well-nourished.  HENT:  Head: Normocephalic and atraumatic.  Mouth/Throat: Oropharynx is clear and moist.  Eyes: Conjunctivae and EOM are normal. Pupils are equal, round, and reactive to light.  Neck: Normal range of motion.  Cardiovascular: Normal rate, regular rhythm and normal heart sounds.   Pulmonary/Chest: Effort normal and breath sounds normal. No respiratory distress. She has no wheezes.  Abdominal: Soft. Bowel sounds are normal.  Musculoskeletal:       Left ankle: She exhibits decreased range of motion, swelling and deformity. Tenderness. Medial malleolus tenderness found.  Deformity of left ankle along the medial joint line; skin is taught but not tenting; small abrasion noted without open wound; no bleeding; DP pulse intact; able to move toes normally, normal sensation throughout left foot  Neurological: She is alert and oriented to person, place, and time.  Skin: Skin is  warm and dry.  Psychiatric: She has a normal mood and affect.  Nursing note and vitals reviewed.    ED Treatments / Results  Labs (all labs ordered are listed, but only abnormal results are displayed) Labs Reviewed - No data to display  EKG  EKG Interpretation None       Radiology No results found.  Procedures Reduction of dislocation Date/Time: 02/08/2016 5:37 PM Performed by: Larene Pickett Authorized by: Larene Pickett  Consent: Verbal consent obtained. Written consent obtained. Risks and benefits: risks, benefits and alternatives were discussed Consent given by: patient Patient understanding: patient states understanding of the procedure being performed Patient consent: the patient's understanding of the procedure matches consent given Procedure consent: procedure consent matches procedure scheduled Imaging studies:  imaging studies available Required items: required blood products, implants, devices, and special equipment available Patient identity confirmed: verbally with patient Time out: Immediately prior to procedure a "time out" was called to verify the correct patient, procedure, equipment, support staff and site/side marked as required. Local anesthesia used: no  Anesthesia: Local anesthesia used: no  Sedation: Patient sedated: yes Sedation type: moderate (conscious) sedation Sedatives: etomidate and midazolam Sedation start date/time: 02/08/2016 5:30 PM Sedation end date/time: 02/08/2016 5:38 PM Vitals: Vital signs were monitored during sedation. Patient tolerance: Patient tolerated the procedure well with no immediate complications  .Sedation Date/Time: 02/08/2016 5:38 PM Performed by: Larene Pickett Authorized by: Larene Pickett   Consent:    Consent obtained:  Verbal   Consent given by:  Patient   Risks discussed:  Allergic reaction, prolonged hypoxia resulting in organ damage, dysrhythmia and inadequate sedation   Alternatives discussed:   Analgesia without sedation Indications:    Procedure performed:  Dislocation reduction   Procedure necessitating sedation performed by:  Physician performing sedation   Intended level of sedation:  Moderate (conscious sedation) Pre-sedation assessment:    Time since last food or drink:  1200   Neck mobility: normal     Mouth opening:  3 or more finger widths   History of difficult intubation: no     Pre-sedation assessment completed:  02/08/2016 5:25 PM Immediate pre-procedure details:    Reassessment: Patient reassessed immediately prior to procedure     Reviewed: vital signs, relevant labs/tests and NPO status     Verified: bag valve mask available, emergency equipment available, intubation equipment available, IV patency confirmed and oxygen available   Procedure details (see MAR for exact dosages):    Sedation start time:  02/08/2016 5:30 PM   Preoxygenation:  Nasal cannula   Sedation:  Etomidate   Intra-procedure monitoring:  Blood pressure monitoring, cardiac monitor and continuous pulse oximetry   Intra-procedure events: none     Sedation end time:  02/08/2016 5:38 PM   Total sedation time (minutes):  8 Post-procedure details:    Post-sedation assessment completed:  02/08/2016 5:40 PM   Attendance: Constant attendance by certified staff until patient recovered     Recovery: Patient returned to pre-procedure baseline     Complications:  None   Specimens recovered:  None   Patient tolerance:  Tolerated well, no immediate complications   (including critical care time)  Medications Ordered in ED Medications - No data to display   Initial Impression / Assessment and Plan / ED Course  I have reviewed the triage vital signs and the nursing notes.  Pertinent labs & imaging results that were available during my care of the patient were reviewed by me and considered in my medical decision making (see chart for details).  Clinical Course    55 year old female here with left  ankle injury after a fall at home in wet leaves. No head injury loss of consciousness. She arrives with gross deformity of the left medial ankle. Skin is taught but not tenting. There is small abrasion but no open wound or bleeding. Her foot remains neurovascularly intact. She is able to move her toes normally. X-ray reveals comminuted fracture dislocation of the left ankle. Discussed with orthopedics, Dr Erlinda Hong-- recommends to reduce dislocation here, repeat films which he will review.  If satisfactory alignment, may be discharged home with office follow-up on Monday to schedule for operative repair.  Conscious sedation performed with successful reduction of dislocation.  Patient tolerated procedure well,  no immediate complications.  Follow-up films with significant improved anatomic alignment and successful reduction of dislocation.  Dr. Erlinda Hong has reviewed follow-up films-- satisfied with reduction.  Patient has wheelchair that she can use for home use for the next few days.  Will have follow-up in ortho clinic on Monday morning (2 days from now).  Rx percocet for pain.  Discussed home care of splint, keeping it dry, and keeping ankle elevated.  Discussed plan with patient and family, they acknowledged understanding and agreed with plan of care.  Return precautions given for new or worsening symptoms.  This was a shared visit with attending physician, Dr. Lacinda Axon, who evaluated patient and supervised sedation and dislocation reduction.  He is in agreement with care plan as outlined above.  Final Clinical Impressions(s) / ED Diagnoses   Final diagnoses:  Closed fracture of left ankle, initial encounter    New Prescriptions Discharge Medication List as of 02/08/2016  6:50 PM    START taking these medications   Details  oxyCODONE-acetaminophen (PERCOCET/ROXICET) 5-325 MG tablet Take 1 tablet by mouth every 4 (four) hours as needed., Starting Sat 02/08/2016, Print         Larene Pickett, PA-C 02/08/16  1954    Nat Christen, MD 02/09/16 1559

## 2016-02-10 ENCOUNTER — Other Ambulatory Visit (INDEPENDENT_AMBULATORY_CARE_PROVIDER_SITE_OTHER): Payer: Self-pay | Admitting: Orthopaedic Surgery

## 2016-02-10 ENCOUNTER — Encounter (HOSPITAL_BASED_OUTPATIENT_CLINIC_OR_DEPARTMENT_OTHER): Payer: Self-pay | Admitting: *Deleted

## 2016-02-10 ENCOUNTER — Ambulatory Visit (INDEPENDENT_AMBULATORY_CARE_PROVIDER_SITE_OTHER): Payer: Managed Care, Other (non HMO) | Admitting: Orthopaedic Surgery

## 2016-02-10 ENCOUNTER — Encounter (INDEPENDENT_AMBULATORY_CARE_PROVIDER_SITE_OTHER): Payer: Self-pay | Admitting: Orthopaedic Surgery

## 2016-02-10 DIAGNOSIS — S82852A Displaced trimalleolar fracture of left lower leg, initial encounter for closed fracture: Secondary | ICD-10-CM | POA: Diagnosis not present

## 2016-02-10 DIAGNOSIS — S82852D Displaced trimalleolar fracture of left lower leg, subsequent encounter for closed fracture with routine healing: Secondary | ICD-10-CM | POA: Insufficient documentation

## 2016-02-10 MED ORDER — OXYCODONE HCL 5 MG PO TABS: 5.0000 mg | ORAL_TABLET | Freq: Four times a day (QID) | ORAL | 0 refills | Status: DC | PRN

## 2016-02-10 NOTE — Progress Notes (Signed)
Office Visit Note   Patient: Mary Reyes           Date of Birth: [birth year]           MRN: [id number] Visit Date: 02/10/2016              Requested by: Lin Landsman, MD [place],  [id number] PCP: Kristine Garbe, MD   Assessment & Plan: Visit Diagnoses: No diagnosis found.  Plan:  - ankle was resplinted for better alignment - oxycodone given - surgery schedule for Wednesday - The patient has a fracture of left trimalleolar ankle fracture.  The risks benefits and alternatives of surgery were discussed today.  Risks include: malunion, nonunion, recurrent deformity, re-fracture, hardware failure, symptomatic hardware, need for hardware removal, damage to nerves/vessels, post-operative immobilization, need for more surgery, need for physical therapy, possibility of post-operative stiffness, development of arthritis, infection.  Anesthetic risks were also discussed.  Post-operative immobilization, activity and weightbearing restrictions, and likely healing time were discussed.  We discussed that surgery puts the bones in a good position and stabilizes them, but we still count on biology and time to heal the fracture.  Smoking, activity restriction non-compliance and host medical co-morbidities can all affect healing.  Non-operative treatment of fractures is certainly an option and risks, benefits of this were discussed.  Specifically my concerns for deformity, disability, and potential difficulty healing the fracture were discussed.    We discussed reasonable expectations after surgery.  The patient will likely need therapy for stiffness and recurrent problems is always a concern.    Follow-Up Instructions: Return for 2 week postop visit.   Orders:  No orders of the defined types were placed in this encounter.  Meds ordered this encounter  Medications  . oxyCODONE (OXY IR/ROXICODONE) 5 MG immediate release tablet    Sig: Take 1-2 tablets (5-10 mg total) by  mouth every 6 (six) hours as needed.    Dispense:  30 tablet    Refill:  0      Procedures: No procedures performed   Clinical Data: No additional findings.   Subjective: Chief Complaint  Patient presents with  . Left Ankle - Pain    55 yo female here for ER follow up for left ankle fx dislocation.  Pain is severe in left ankle worse with movement, better with elevation.  Pain is throbbing pain.  Ambulating in wheelchair.  Denies LOC or syncope.  Pain doesn't radiate.  Denies f/c.    Review of Systems  Constitutional: Negative.   HENT: Negative.   Eyes: Negative.   Respiratory: Negative.   Cardiovascular: Negative.   Endocrine: Negative.   Musculoskeletal: Negative.   Neurological: Negative.   Hematological: Negative.   Psychiatric/Behavioral: Negative.   All other systems reviewed and are negative.    Objective: Vital Signs: There were no vitals taken for this visit.  Physical Exam  Constitutional: She is oriented to person, place, and time. She appears well-developed and well-nourished.  HENT:  Head: Atraumatic.  Eyes: EOM are normal.  Neck: Neck supple.  Cardiovascular: Intact distal pulses.   Pulmonary/Chest: Effort normal.  Abdominal: Soft.  Neurological: She is alert and oriented to person, place, and time.  Skin: Skin is warm. Capillary refill takes less than 2 seconds.  Psychiatric: She has a normal mood and affect. Her behavior is normal. Judgment and thought content normal.  Nursing note and vitals reviewed.   Left Ankle Exam   Other  Sensation: normal Pulse:  present  Comments:  Skin intact.  Strong pedal pulses.  Foot wwp.  Pain with movement of foot and ankle.      Specialty Comments:  No specialty comments available.  Imaging: Xrays of left ankle independently reviewed and interpreted shows unstable trimal ankle fx dislocation.    PMFS History: Patient Active Problem List   Diagnosis Date Noted  . Heterozygous MTHFR mutation  C677T ([place]) 01/21/2016  . Vitamin D deficiency 08/15/2014  . Osteopenia 08/15/2014  . OSA (obstructive sleep apnea) 08/07/2014  . Essential hypertension 06/27/2014  . GERD (gastroesophageal reflux disease) 06/27/2014  . Hypothyroidism 06/27/2014  . Depression 06/27/2014  . Allergic rhinitis 06/27/2014   Past Medical History:  Diagnosis Date  . Allergy   . Arthritis   . Blood in stool   . Colon polyp   . Depression   . Frequent headaches   . Hypertension   . Migraine   . OSA (obstructive sleep apnea)    CPAP  . Thyroid disease     Family History  Problem Relation Age of Onset  . Healthy Mother   . Alcohol abuse Father     Deceased  . Stroke Maternal Grandmother   . Arthritis Paternal Grandmother   . Depression Daughter   . Heart murmur Daughter   . Fibromyalgia Daughter   . Migraines Son   . Brain cancer Sister   . Asthma Sister     Past Surgical History:  Procedure Laterality Date  . ABDOMINAL HYSTERECTOMY  2004  . WISDOM TOOTH EXTRACTION     Social History   Occupational History  . [employer]    Social History Main Topics  . Smoking status: Never Smoker  . Smokeless tobacco: Never Used  . Alcohol use No  . Drug use: No  . Sexual activity: Not on file

## 2016-02-10 NOTE — Progress Notes (Deleted)
   Office Visit Note   Patient: Mary Reyes           Date of Birth: 1960/08/26           MRN: [id number] Visit Date: 02/10/2016              Requested by: Lin Landsman, MD [place], [place] [address] PCP: Kristine Garbe, MD   Assessment & Plan: Visit Diagnoses: No diagnosis found.  Plan: ***  Follow-Up Instructions: No Follow-up on file.   Orders:  No orders of the defined types were placed in this encounter.  No orders of the defined types were placed in this encounter.     Procedures: No procedures performed   Clinical Data: No additional findings.   Subjective: Chief Complaint  Patient presents with  . Left Ankle - Pain    HPI  Review of Systems   Objective: Vital Signs: There were no vitals taken for this visit.  Physical Exam  Ortho Exam  Specialty Comments:  No specialty comments available.  Imaging: No results found.   PMFS History: Patient Active Problem List   Diagnosis Date Noted  . Heterozygous MTHFR mutation C677T ([place]) 01/21/2016  . Vitamin D deficiency 08/15/2014  . Osteopenia 08/15/2014  . OSA (obstructive sleep apnea) 08/07/2014  . Essential hypertension 06/27/2014  . GERD (gastroesophageal reflux disease) 06/27/2014  . Hypothyroidism 06/27/2014  . Depression 06/27/2014  . Allergic rhinitis 06/27/2014   Past Medical History:  Diagnosis Date  . Allergy   . Arthritis   . Blood in stool   . Colon polyp   . Depression   . Frequent headaches   . Hypertension   . Migraine   . OSA (obstructive sleep apnea)    CPAP  . Thyroid disease     Family History  Problem Relation Age of Onset  . Healthy Mother   . Alcohol abuse Father     Deceased  . Stroke Maternal Grandmother   . Arthritis Paternal Grandmother   . Depression Daughter   . Heart murmur Daughter   . Fibromyalgia Daughter   . Migraines Son   . Brain cancer Sister   . Asthma Sister     Past Surgical History:  Procedure Laterality  Date  . ABDOMINAL HYSTERECTOMY  2004  . WISDOM TOOTH EXTRACTION     Social History   Occupational History  . [employer]    Social History Main Topics  . Smoking status: Never Smoker  . Smokeless tobacco: Never Used  . Alcohol use No  . Drug use: No  . Sexual activity: Not on file

## 2016-02-11 ENCOUNTER — Encounter (HOSPITAL_BASED_OUTPATIENT_CLINIC_OR_DEPARTMENT_OTHER)
Admission: RE | Admit: 2016-02-11 | Discharge: 2016-02-11 | Disposition: A | Discharge status: Home / Self Care | Payer: Managed Care, Other (non HMO) | Source: Ambulatory Visit | Attending: Orthopaedic Surgery | Admitting: Orthopaedic Surgery

## 2016-02-11 ENCOUNTER — Other Ambulatory Visit: Payer: Self-pay

## 2016-02-11 DIAGNOSIS — G4733 Obstructive sleep apnea (adult) (pediatric): Secondary | ICD-10-CM | POA: Diagnosis not present

## 2016-02-11 DIAGNOSIS — Z79890 Hormone replacement therapy (postmenopausal): Secondary | ICD-10-CM | POA: Diagnosis not present

## 2016-02-11 DIAGNOSIS — K219 Gastro-esophageal reflux disease without esophagitis: Secondary | ICD-10-CM | POA: Diagnosis not present

## 2016-02-11 DIAGNOSIS — Z6841 Body Mass Index (BMI) 40.0 and over, adult: Secondary | ICD-10-CM | POA: Diagnosis not present

## 2016-02-11 DIAGNOSIS — Z79899 Other long term (current) drug therapy: Secondary | ICD-10-CM | POA: Diagnosis not present

## 2016-02-11 DIAGNOSIS — S82852A Displaced trimalleolar fracture of left lower leg, initial encounter for closed fracture: Secondary | ICD-10-CM | POA: Diagnosis not present

## 2016-02-11 DIAGNOSIS — R51 Headache: Secondary | ICD-10-CM | POA: Diagnosis not present

## 2016-02-11 DIAGNOSIS — F329 Major depressive disorder, single episode, unspecified: Secondary | ICD-10-CM | POA: Diagnosis not present

## 2016-02-11 DIAGNOSIS — F419 Anxiety disorder, unspecified: Secondary | ICD-10-CM | POA: Diagnosis not present

## 2016-02-11 DIAGNOSIS — E039 Hypothyroidism, unspecified: Secondary | ICD-10-CM | POA: Diagnosis not present

## 2016-02-11 DIAGNOSIS — I10 Essential (primary) hypertension: Secondary | ICD-10-CM | POA: Diagnosis not present

## 2016-02-11 DIAGNOSIS — X58XXXA Exposure to other specified factors, initial encounter: Secondary | ICD-10-CM | POA: Diagnosis not present

## 2016-02-11 DIAGNOSIS — Z7951 Long term (current) use of inhaled steroids: Secondary | ICD-10-CM | POA: Diagnosis not present

## 2016-02-11 LAB — BASIC METABOLIC PANEL
ANION GAP: 8 (ref 5–15)
BUN: 10 mg/dL (ref 6–20)
CHLORIDE: 98 mmol/L — AB (ref 101–111)
CO2: 28 mmol/L (ref 22–32)
Calcium: 9.2 mg/dL (ref 8.9–10.3)
Creatinine, Ser: 0.99 mg/dL (ref 0.44–1.00)
GFR calc Af Amer: >60 mL/min (ref >60)
GLUCOSE: 83 mg/dL (ref 65–99)
POTASSIUM: 4.1 mmol/L (ref 3.5–5.1)
SODIUM: 134 mmol/L — AB (ref 135–145)

## 2016-02-12 ENCOUNTER — Encounter (HOSPITAL_BASED_OUTPATIENT_CLINIC_OR_DEPARTMENT_OTHER): Payer: Self-pay | Admitting: Certified Registered"

## 2016-02-12 ENCOUNTER — Encounter (HOSPITAL_BASED_OUTPATIENT_CLINIC_OR_DEPARTMENT_OTHER)
Admission: RE | Disposition: A | Discharge status: Home / Self Care | Payer: Self-pay | Source: Ambulatory Visit | Attending: Orthopaedic Surgery

## 2016-02-12 ENCOUNTER — Ambulatory Visit (HOSPITAL_COMMUNITY): Payer: Managed Care, Other (non HMO)

## 2016-02-12 ENCOUNTER — Ambulatory Visit (HOSPITAL_BASED_OUTPATIENT_CLINIC_OR_DEPARTMENT_OTHER)
Admission: RE | Admit: 2016-02-12 | Discharge: 2016-02-13 | Disposition: A | Discharge status: Home / Self Care | Payer: Managed Care, Other (non HMO) | Source: Ambulatory Visit | Attending: Orthopaedic Surgery | Admitting: Orthopaedic Surgery

## 2016-02-12 ENCOUNTER — Ambulatory Visit (HOSPITAL_BASED_OUTPATIENT_CLINIC_OR_DEPARTMENT_OTHER): Payer: Managed Care, Other (non HMO) | Admitting: Certified Registered"

## 2016-02-12 DIAGNOSIS — I10 Essential (primary) hypertension: Secondary | ICD-10-CM | POA: Insufficient documentation

## 2016-02-12 DIAGNOSIS — Z79890 Hormone replacement therapy (postmenopausal): Secondary | ICD-10-CM | POA: Insufficient documentation

## 2016-02-12 DIAGNOSIS — X58XXXA Exposure to other specified factors, initial encounter: Secondary | ICD-10-CM | POA: Insufficient documentation

## 2016-02-12 DIAGNOSIS — Z8781 Personal history of (healed) traumatic fracture: Secondary | ICD-10-CM

## 2016-02-12 DIAGNOSIS — E039 Hypothyroidism, unspecified: Secondary | ICD-10-CM | POA: Insufficient documentation

## 2016-02-12 DIAGNOSIS — F329 Major depressive disorder, single episode, unspecified: Secondary | ICD-10-CM | POA: Insufficient documentation

## 2016-02-12 DIAGNOSIS — S82852A Displaced trimalleolar fracture of left lower leg, initial encounter for closed fracture: Secondary | ICD-10-CM | POA: Insufficient documentation

## 2016-02-12 DIAGNOSIS — G4733 Obstructive sleep apnea (adult) (pediatric): Secondary | ICD-10-CM | POA: Insufficient documentation

## 2016-02-12 DIAGNOSIS — Z7951 Long term (current) use of inhaled steroids: Secondary | ICD-10-CM | POA: Insufficient documentation

## 2016-02-12 DIAGNOSIS — R51 Headache: Secondary | ICD-10-CM | POA: Insufficient documentation

## 2016-02-12 DIAGNOSIS — F419 Anxiety disorder, unspecified: Secondary | ICD-10-CM | POA: Insufficient documentation

## 2016-02-12 DIAGNOSIS — Z6841 Body Mass Index (BMI) 40.0 and over, adult: Secondary | ICD-10-CM | POA: Insufficient documentation

## 2016-02-12 DIAGNOSIS — K219 Gastro-esophageal reflux disease without esophagitis: Secondary | ICD-10-CM | POA: Insufficient documentation

## 2016-02-12 DIAGNOSIS — Z79899 Other long term (current) drug therapy: Secondary | ICD-10-CM | POA: Insufficient documentation

## 2016-02-12 DIAGNOSIS — Z9889 Other specified postprocedural states: Secondary | ICD-10-CM

## 2016-02-12 DIAGNOSIS — Z419 Encounter for procedure for purposes other than remedying health state, unspecified: Secondary | ICD-10-CM

## 2016-02-12 HISTORY — DX: Gastro-esophageal reflux disease without esophagitis: K21.9

## 2016-02-12 HISTORY — DX: Anxiety disorder, unspecified: F41.9

## 2016-02-12 HISTORY — DX: Displaced trimalleolar fracture of left lower leg, sequela: S82.852S

## 2016-02-12 HISTORY — DX: Hypothyroidism, unspecified: E03.9

## 2016-02-12 HISTORY — PX: ORIF ANKLE FRACTURE: SHX5408

## 2016-02-12 SURGERY — OPEN REDUCTION INTERNAL FIXATION (ORIF) ANKLE FRACTURE
Anesthesia: General | Site: Ankle | Laterality: Left

## 2016-02-12 MED ORDER — ONDANSETRON HCL 4 MG/2ML IJ SOLN
INTRAMUSCULAR | Status: AC
  Filled 2016-02-12: qty 2

## 2016-02-12 MED ORDER — ONDANSETRON HCL 4 MG/2ML IJ SOLN: 4.0000 mg | Freq: Four times a day (QID) | INTRAMUSCULAR | Status: DC | PRN

## 2016-02-12 MED ORDER — DIPHENHYDRAMINE HCL 12.5 MG/5ML PO ELIX: 25.0000 mg | ORAL_SOLUTION | ORAL | Status: DC | PRN

## 2016-02-12 MED ORDER — LEVOCETIRIZINE DIHYDROCHLORIDE 5 MG PO TABS: 5.0000 mg | ORAL_TABLET | Freq: Every day | ORAL | Status: DC

## 2016-02-12 MED ORDER — OXYCODONE HCL 5 MG PO TABS: 5.0000 mg | ORAL_TABLET | Freq: Once | ORAL | Status: DC | PRN

## 2016-02-12 MED ORDER — HYDROMORPHONE HCL 1 MG/ML IJ SOLN
INTRAMUSCULAR | Status: AC
  Filled 2016-02-12: qty 1

## 2016-02-12 MED ORDER — CEFAZOLIN SODIUM-DEXTROSE 2-4 GM/100ML-% IV SOLN
2.0000 g | Freq: Four times a day (QID) | INTRAVENOUS | Status: AC
  Administered 2016-02-12 – 2016-02-13 (×3): 2 g via INTRAVENOUS
  Filled 2016-02-12 (×3): qty 100

## 2016-02-12 MED ORDER — BUPROPION HCL 75 MG PO TABS
75.0000 mg | ORAL_TABLET | Freq: Two times a day (BID) | ORAL | Status: DC
  Administered 2016-02-12: 75 mg via ORAL

## 2016-02-12 MED ORDER — LIDOCAINE 2% (20 MG/ML) 5 ML SYRINGE
INTRAMUSCULAR | Status: AC
  Filled 2016-02-12: qty 5

## 2016-02-12 MED ORDER — LACTATED RINGERS IV SOLN
INTRAVENOUS | Status: DC
Start: 2016-02-12 — End: 2016-02-12
  Administered 2016-02-12 (×2): via INTRAVENOUS

## 2016-02-12 MED ORDER — MORPHINE SULFATE (PF) 2 MG/ML IV SOLN: 1.0000 mg | INTRAVENOUS | Status: DC | PRN

## 2016-02-12 MED ORDER — KETOROLAC TROMETHAMINE 30 MG/ML IJ SOLN
30.0000 mg | Freq: Four times a day (QID) | INTRAMUSCULAR | Status: DC | PRN
  Administered 2016-02-12 (×2): 30 mg via INTRAVENOUS
  Filled 2016-02-12 (×2): qty 1

## 2016-02-12 MED ORDER — PROPOFOL 500 MG/50ML IV EMUL
INTRAVENOUS | Status: AC
  Filled 2016-02-12: qty 50

## 2016-02-12 MED ORDER — VITAMIN D3 25 MCG (1000 UNIT) PO TABS
2000.0000 [IU] | ORAL_TABLET | Freq: Every day | ORAL | Status: DC
  Administered 2016-02-12: 2000 [IU] via ORAL

## 2016-02-12 MED ORDER — LEVOTHYROXINE SODIUM 50 MCG PO TABS: 50.0000 ug | ORAL_TABLET | Freq: Every day | ORAL | Status: DC

## 2016-02-12 MED ORDER — LIDOCAINE 2% (20 MG/ML) 5 ML SYRINGE
INTRAMUSCULAR | Status: AC
Start: 2016-02-12 — End: 2016-02-12
  Filled 2016-02-12: qty 5

## 2016-02-12 MED ORDER — MIDAZOLAM HCL 2 MG/2ML IJ SOLN
INTRAMUSCULAR | Status: AC
  Filled 2016-02-12: qty 2

## 2016-02-12 MED ORDER — SCOPOLAMINE 1 MG/3DAYS TD PT72: 1.0000 | MEDICATED_PATCH | Freq: Once | TRANSDERMAL | Status: DC | PRN

## 2016-02-12 MED ORDER — METOCLOPRAMIDE HCL 5 MG PO TABS: 5.0000 mg | ORAL_TABLET | Freq: Three times a day (TID) | ORAL | Status: DC | PRN

## 2016-02-12 MED ORDER — DEXAMETHASONE SODIUM PHOSPHATE 10 MG/ML IJ SOLN
INTRAMUSCULAR | Status: AC
Start: 2016-02-12 — End: 2016-02-12
  Filled 2016-02-12: qty 1

## 2016-02-12 MED ORDER — ESTRADIOL 0.1 MG/GM VA CREA: 1.0000 | TOPICAL_CREAM | VAGINAL | Status: DC

## 2016-02-12 MED ORDER — PANTOPRAZOLE SODIUM 40 MG PO TBEC
40.0000 mg | DELAYED_RELEASE_TABLET | Freq: Every day | ORAL | Status: DC
  Administered 2016-02-12: 40 mg via ORAL

## 2016-02-12 MED ORDER — FLUTICASONE PROPIONATE 50 MCG/ACT NA SUSP: 2.0000 | Freq: Every day | NASAL | Status: DC

## 2016-02-12 MED ORDER — LIDOCAINE HCL (CARDIAC) 20 MG/ML IV SOLN
INTRAVENOUS | Status: DC | PRN
  Administered 2016-02-12: 30 mg via INTRAVENOUS

## 2016-02-12 MED ORDER — HYDROMORPHONE HCL 1 MG/ML IJ SOLN
0.2500 mg | INTRAMUSCULAR | Status: DC | PRN
  Administered 2016-02-12 (×2): 0.5 mg via INTRAVENOUS

## 2016-02-12 MED ORDER — DEXAMETHASONE SODIUM PHOSPHATE 10 MG/ML IJ SOLN
INTRAMUSCULAR | Status: DC | PRN
  Administered 2016-02-12: 10 mg via INTRAVENOUS

## 2016-02-12 MED ORDER — OXYCODONE HCL 5 MG PO TABS: 5.0000 mg | ORAL_TABLET | ORAL | 0 refills | Status: DC | PRN

## 2016-02-12 MED ORDER — PROMETHAZINE HCL 25 MG PO TABS: 25.0000 mg | ORAL_TABLET | Freq: Four times a day (QID) | ORAL | 1 refills | Status: DC | PRN

## 2016-02-12 MED ORDER — SENNOSIDES-DOCUSATE SODIUM 8.6-50 MG PO TABS: 1.0000 | ORAL_TABLET | Freq: Every evening | ORAL | 1 refills | Status: DC | PRN

## 2016-02-12 MED ORDER — OXYCODONE HCL ER 10 MG PO T12A: 10.0000 mg | EXTENDED_RELEASE_TABLET | Freq: Two times a day (BID) | ORAL | 0 refills | Status: DC

## 2016-02-12 MED ORDER — ASPIRIN EC 325 MG PO TBEC: 325.0000 mg | DELAYED_RELEASE_TABLET | Freq: Two times a day (BID) | ORAL | 0 refills | Status: DC

## 2016-02-12 MED ORDER — OXYCODONE HCL 5 MG PO TABS
5.0000 mg | ORAL_TABLET | ORAL | Status: DC | PRN
  Administered 2016-02-12 (×3): 10 mg via ORAL
  Filled 2016-02-12 (×2): qty 2

## 2016-02-12 MED ORDER — ASPIRIN EC 325 MG PO TBEC
325.0000 mg | DELAYED_RELEASE_TABLET | Freq: Two times a day (BID) | ORAL | Status: DC
  Administered 2016-02-13: 325 mg via ORAL
  Filled 2016-02-12: qty 1

## 2016-02-12 MED ORDER — BUPIVACAINE HCL (PF) 0.5 % IJ SOLN
INTRAMUSCULAR | Status: AC
  Filled 2016-02-12: qty 30

## 2016-02-12 MED ORDER — ALPRAZOLAM 0.25 MG PO TABS: 0.2500 mg | ORAL_TABLET | Freq: Every evening | ORAL | Status: DC | PRN

## 2016-02-12 MED ORDER — ONDANSETRON HCL 4 MG/2ML IJ SOLN
INTRAMUSCULAR | Status: DC | PRN
  Administered 2016-02-12: 4 mg via INTRAVENOUS

## 2016-02-12 MED ORDER — OXYCODONE HCL 5 MG/5ML PO SOLN: 5.0000 mg | Freq: Once | ORAL | Status: DC | PRN

## 2016-02-12 MED ORDER — EPINEPHRINE 30 MG/30ML IJ SOLN
INTRAMUSCULAR | Status: AC
  Filled 2016-02-12: qty 1

## 2016-02-12 MED ORDER — DEXAMETHASONE SODIUM PHOSPHATE 10 MG/ML IJ SOLN
INTRAMUSCULAR | Status: AC
  Filled 2016-02-12: qty 1

## 2016-02-12 MED ORDER — CEFAZOLIN SODIUM-DEXTROSE 2-4 GM/100ML-% IV SOLN
INTRAVENOUS | Status: AC
  Filled 2016-02-12: qty 100

## 2016-02-12 MED ORDER — CALCIUM CARBONATE-VITAMIN D 500-200 MG-UNIT PO TABS: 1.0000 | ORAL_TABLET | Freq: Three times a day (TID) | ORAL | 12 refills | Status: AC

## 2016-02-12 MED ORDER — HYDROCHLOROTHIAZIDE 25 MG PO TABS: 12.5000 mg | ORAL_TABLET | Freq: Every day | ORAL | Status: DC

## 2016-02-12 MED ORDER — CEFAZOLIN SODIUM-DEXTROSE 2-4 GM/100ML-% IV SOLN: 2.0000 g | Freq: Four times a day (QID) | INTRAVENOUS | Status: DC

## 2016-02-12 MED ORDER — PROPOFOL 10 MG/ML IV BOLUS
INTRAVENOUS | Status: DC | PRN
  Administered 2016-02-12: 150 mg via INTRAVENOUS

## 2016-02-12 MED ORDER — DEXTROSE 5 % IV SOLN: 3.0000 g | Freq: Four times a day (QID) | INTRAVENOUS | Status: DC

## 2016-02-12 MED ORDER — ONDANSETRON HCL 4 MG PO TABS: 4.0000 mg | ORAL_TABLET | Freq: Four times a day (QID) | ORAL | Status: DC | PRN

## 2016-02-12 MED ORDER — FENTANYL CITRATE (PF) 100 MCG/2ML IJ SOLN
INTRAMUSCULAR | Status: AC
  Filled 2016-02-12: qty 2

## 2016-02-12 MED ORDER — MAGNESIUM CITRATE PO SOLN: 1.0000 | Freq: Once | ORAL | Status: DC | PRN

## 2016-02-12 MED ORDER — ACETAMINOPHEN 325 MG PO TABS: 650.0000 mg | ORAL_TABLET | Freq: Four times a day (QID) | ORAL | Status: DC | PRN

## 2016-02-12 MED ORDER — FENTANYL CITRATE (PF) 100 MCG/2ML IJ SOLN
50.0000 ug | INTRAMUSCULAR | Status: DC | PRN
  Administered 2016-02-12: 50 ug via INTRAVENOUS
  Administered 2016-02-12: 25 ug via INTRAVENOUS

## 2016-02-12 MED ORDER — POLYETHYLENE GLYCOL 3350 17 G PO PACK: 17.0000 g | PACK | Freq: Every day | ORAL | Status: DC | PRN

## 2016-02-12 MED ORDER — LEVOTHYROXINE SODIUM 100 MCG PO TABS: 100.0000 ug | ORAL_TABLET | ORAL | Status: DC

## 2016-02-12 MED ORDER — ONDANSETRON HCL 4 MG PO TABS: 4.0000 mg | ORAL_TABLET | Freq: Three times a day (TID) | ORAL | 0 refills | Status: DC | PRN

## 2016-02-12 MED ORDER — PHENYLEPHRINE HCL 10 MG PO TABS: 10.0000 mg | ORAL_TABLET | ORAL | Status: DC | PRN

## 2016-02-12 MED ORDER — ESCITALOPRAM OXALATE 20 MG PO TABS: 30.0000 mg | ORAL_TABLET | Freq: Every day | ORAL | Status: DC

## 2016-02-12 MED ORDER — LIOTHYRONINE SODIUM 5 MCG PO TABS: 7.5000 ug | ORAL_TABLET | Freq: Every day | ORAL | Status: DC

## 2016-02-12 MED ORDER — CYCLOBENZAPRINE HCL 10 MG PO TABS: 10.0000 mg | ORAL_TABLET | Freq: Three times a day (TID) | ORAL | Status: DC | PRN

## 2016-02-12 MED ORDER — METOCLOPRAMIDE HCL 5 MG/ML IJ SOLN: 5.0000 mg | Freq: Three times a day (TID) | INTRAMUSCULAR | Status: DC | PRN

## 2016-02-12 MED ORDER — EPHEDRINE 5 MG/ML INJ
INTRAVENOUS | Status: AC
  Filled 2016-02-12: qty 10

## 2016-02-12 MED ORDER — SORBITOL 70 % SOLN: 30.0000 mL | Freq: Every day | Status: DC | PRN

## 2016-02-12 MED ORDER — CEFAZOLIN SODIUM-DEXTROSE 2-4 GM/100ML-% IV SOLN
2.0000 g | INTRAVENOUS | Status: AC
  Administered 2016-02-12: 2 g via INTRAVENOUS

## 2016-02-12 MED ORDER — BUPIVACAINE HCL (PF) 0.25 % IJ SOLN
INTRAMUSCULAR | Status: AC
  Filled 2016-02-12: qty 30

## 2016-02-12 MED ORDER — SODIUM CHLORIDE 0.9 % IV SOLN
INTRAVENOUS | Status: DC
  Administered 2016-02-12: 12:00:00 via INTRAVENOUS

## 2016-02-12 MED ORDER — METHOCARBAMOL 750 MG PO TABS: 750.0000 mg | ORAL_TABLET | Freq: Two times a day (BID) | ORAL | 0 refills | Status: DC | PRN

## 2016-02-12 MED ORDER — MIDAZOLAM HCL 2 MG/2ML IJ SOLN
1.0000 mg | INTRAMUSCULAR | Status: DC | PRN
  Administered 2016-02-12: 1 mg via INTRAVENOUS

## 2016-02-12 MED ORDER — METHOCARBAMOL 1000 MG/10ML IJ SOLN: 500.0000 mg | Freq: Four times a day (QID) | INTRAVENOUS | Status: DC | PRN

## 2016-02-12 MED ORDER — METHOCARBAMOL 500 MG PO TABS
500.0000 mg | ORAL_TABLET | Freq: Four times a day (QID) | ORAL | Status: DC | PRN
  Administered 2016-02-12 (×2): 500 mg via ORAL
  Filled 2016-02-12 (×2): qty 1

## 2016-02-12 MED ORDER — OXYCODONE HCL 5 MG PO TABS
ORAL_TABLET | ORAL | Status: AC
  Filled 2016-02-12: qty 2

## 2016-02-12 MED ORDER — BUPIVACAINE-EPINEPHRINE (PF) 0.5% -1:200000 IJ SOLN
INTRAMUSCULAR | Status: DC | PRN
  Administered 2016-02-12 (×2): 20 mL via PERINEURAL

## 2016-02-12 MED ORDER — ACETAMINOPHEN 650 MG RE SUPP: 650.0000 mg | Freq: Four times a day (QID) | RECTAL | Status: DC | PRN

## 2016-02-12 SURGICAL SUPPLY — 89 items
BANDAGE ACE 4X5 VEL STRL LF (GAUZE/BANDAGES/DRESSINGS) ×3 IMPLANT
BANDAGE ACE 6X5 VEL STRL LF (GAUZE/BANDAGES/DRESSINGS) ×3 IMPLANT
BANDAGE ESMARK 6X9 LF (GAUZE/BANDAGES/DRESSINGS) ×1 IMPLANT
BIT DRILL 3.5X122MM AO FIT (BIT) ×3 IMPLANT
BIT DRILL CANN 2.7 (BIT) ×1
BIT DRILL CANN 2.7MM (BIT) ×1
BIT DRILL SRG 2.7XCANN AO CPLG (BIT) ×1 IMPLANT
BIT DRL SRG 2.7XCANN AO CPLNG (BIT) ×1
BLADE HEX COATED 2.75 (ELECTRODE) ×3 IMPLANT
BLADE SURG 15 STRL LF DISP TIS (BLADE) ×2 IMPLANT
BLADE SURG 15 STRL SS (BLADE) ×4
BNDG COHESIVE 4X5 TAN STRL (GAUZE/BANDAGES/DRESSINGS) ×3 IMPLANT
BNDG COHESIVE 6X5 TAN STRL LF (GAUZE/BANDAGES/DRESSINGS) ×3 IMPLANT
BNDG ESMARK 6X9 LF (GAUZE/BANDAGES/DRESSINGS) ×3
BRUSH SCRUB EZ PLAIN DRY (MISCELLANEOUS) ×3 IMPLANT
CANISTER SUCT 1200ML W/VALVE (MISCELLANEOUS) ×3 IMPLANT
COVER BACK TABLE 60X90IN (DRAPES) ×3 IMPLANT
COVER MAYO STAND STRL (DRAPES) ×3 IMPLANT
CUFF TOURNIQUET SINGLE 34IN LL (TOURNIQUET CUFF) ×3 IMPLANT
DECANTER SPIKE VIAL GLASS SM (MISCELLANEOUS) IMPLANT
DRAPE C-ARM 42X72 X-RAY (DRAPES) ×3 IMPLANT
DRAPE C-ARMOR (DRAPES) ×3 IMPLANT
DRAPE EXTREMITY T 121X128X90 (DRAPE) ×3 IMPLANT
DRAPE IMP U-DRAPE 54X76 (DRAPES) ×3 IMPLANT
DRAPE SURG 17X23 STRL (DRAPES) ×3 IMPLANT
DRILL 2.6X122MM WL AO SHAFT (BIT) ×3 IMPLANT
DRSG PAD ABDOMINAL 8X10 ST (GAUZE/BANDAGES/DRESSINGS) ×6 IMPLANT
DURAPREP 26ML APPLICATOR (WOUND CARE) ×6 IMPLANT
ELECT REM PT RETURN 9FT ADLT (ELECTROSURGICAL) ×3
ELECTRODE REM PT RTRN 9FT ADLT (ELECTROSURGICAL) ×1 IMPLANT
GAUZE SPONGE 4X4 12PLY STRL (GAUZE/BANDAGES/DRESSINGS) ×3 IMPLANT
GAUZE XEROFORM 1X8 LF (GAUZE/BANDAGES/DRESSINGS) ×3 IMPLANT
GLOVE BIOGEL M STRL SZ7.5 (GLOVE) ×3 IMPLANT
GLOVE BIOGEL PI IND STRL 8 (GLOVE) ×2 IMPLANT
GLOVE BIOGEL PI INDICATOR 8 (GLOVE) ×4
GLOVE EXAM NITRILE LRG STRL (GLOVE) ×3 IMPLANT
GLOVE SKINSENSE NS SZ7.5 (GLOVE) ×4
GLOVE SKINSENSE STRL SZ7.5 (GLOVE) ×2 IMPLANT
GLOVE SURG SYN 7.5  E (GLOVE) ×2
GLOVE SURG SYN 7.5 E (GLOVE) ×1 IMPLANT
GOWN STRL REIN XL XLG (GOWN DISPOSABLE) ×3 IMPLANT
GOWN STRL REUS W/ TWL LRG LVL3 (GOWN DISPOSABLE) IMPLANT
GOWN STRL REUS W/ TWL XL LVL3 (GOWN DISPOSABLE) ×1 IMPLANT
GOWN STRL REUS W/TWL LRG LVL3 (GOWN DISPOSABLE)
GOWN STRL REUS W/TWL XL LVL3 (GOWN DISPOSABLE) ×2
K-WIRE ORTHOPEDIC 1.4X150L (WIRE) ×6
KWIRE ORTHOPEDIC 1.4X150L (WIRE) ×2 IMPLANT
NEEDLE HYPO 22GX1.5 SAFETY (NEEDLE) IMPLANT
NS IRRIG 1000ML POUR BTL (IV SOLUTION) ×3 IMPLANT
PACK BASIN DAY SURGERY FS (CUSTOM PROCEDURE TRAY) ×3 IMPLANT
PAD CAST 3X4 CTTN HI CHSV (CAST SUPPLIES) IMPLANT
PAD CAST 4YDX4 CTTN HI CHSV (CAST SUPPLIES) ×1 IMPLANT
PADDING CAST COTTON 3X4 STRL (CAST SUPPLIES)
PADDING CAST COTTON 4X4 STRL (CAST SUPPLIES) ×2
PADDING CAST COTTON 6X4 STRL (CAST SUPPLIES) IMPLANT
PADDING CAST SYN 6 (CAST SUPPLIES) ×2
PADDING CAST SYNTHETIC 4 (CAST SUPPLIES) ×2
PADDING CAST SYNTHETIC 4X4 STR (CAST SUPPLIES) ×1 IMPLANT
PADDING CAST SYNTHETIC 6X4 NS (CAST SUPPLIES) ×1 IMPLANT
PENCIL BUTTON HOLSTER BLD 10FT (ELECTRODE) ×3 IMPLANT
PLATE FIBULA 4H (Plate) ×3 IMPLANT
SCREW BONE 14MMX3.5MM (Screw) ×9 IMPLANT
SCREW BONE 3.5X20MM (Screw) ×6 IMPLANT
SCREW BONE NON-LCKING 3.5X12MM (Screw) ×3 IMPLANT
SCREW CANNULATED 4.0X36MM FT (Screw) ×3 IMPLANT
SCREW CANNULATED 4.0X36MM PT (Screw) ×3 IMPLANT
SCREW LOCKING 3.5X16MM (Screw) ×9 IMPLANT
SHEET MEDIUM DRAPE 40X70 STRL (DRAPES) ×3 IMPLANT
SLEEVE SCD COMPRESS KNEE MED (MISCELLANEOUS) ×3 IMPLANT
SPLINT FIBERGLASS 4X30 (CAST SUPPLIES) ×3 IMPLANT
SPONGE LAP 18X18 X RAY DECT (DISPOSABLE) ×3 IMPLANT
SUCTION FRAZIER HANDLE 10FR (MISCELLANEOUS) ×2
SUCTION TUBE FRAZIER 10FR DISP (MISCELLANEOUS) ×1 IMPLANT
SUT ETHILON 3 0 PS 1 (SUTURE) ×6 IMPLANT
SUT VIC AB 0 CT1 27 (SUTURE) ×4
SUT VIC AB 0 CT1 27XBRD ANBCTR (SUTURE) ×2 IMPLANT
SUT VIC AB 2-0 CT1 27 (SUTURE) ×4
SUT VIC AB 2-0 CT1 TAPERPNT 27 (SUTURE) ×2 IMPLANT
SUT VIC AB 3-0 SH 27 (SUTURE)
SUT VIC AB 3-0 SH 27X BRD (SUTURE) IMPLANT
SYR BULB 3OZ (MISCELLANEOUS) ×3 IMPLANT
SYR CONTROL 10ML LL (SYRINGE) IMPLANT
TOWEL OR 17X24 6PK STRL BLUE (TOWEL DISPOSABLE) ×6 IMPLANT
TOWEL OR NON WOVEN STRL DISP B (DISPOSABLE) ×3 IMPLANT
TRAY DSU PREP LF (CUSTOM PROCEDURE TRAY) ×3 IMPLANT
TUBE CONNECTING 20'X1/4 (TUBING) ×1
TUBE CONNECTING 20X1/4 (TUBING) ×2 IMPLANT
UNDERPAD 30X30 (UNDERPADS AND DIAPERS) ×3 IMPLANT
YANKAUER SUCT BULB TIP NO VENT (SUCTIONS) ×3 IMPLANT

## 2016-02-12 NOTE — Anesthesia Preprocedure Evaluation (Signed)
Anesthesia Evaluation  Patient identified by MRN, date of birth, ID band Patient awake    Reviewed: Allergy & Precautions, H&P , NPO status , Patient's Chart, lab work & pertinent test results  Airway Mallampati: II   Neck ROM: full    Dental   Pulmonary sleep apnea ,    breath sounds clear to auscultation       Cardiovascular hypertension,  Rhythm:regular Rate:Normal     Neuro/Psych  Headaches, PSYCHIATRIC DISORDERS Anxiety Depression    GI/Hepatic GERD  ,  Endo/Other  Hypothyroidism Morbid obesity  Renal/GU      Musculoskeletal  (+) Arthritis ,   Abdominal   Peds  Hematology   Anesthesia Other Findings   Reproductive/Obstetrics                             Anesthesia Physical Anesthesia Plan  ASA: II  Anesthesia Plan: General and Regional   Post-op Pain Management:  Regional for Post-op pain   Induction: Intravenous  Airway Management Planned: LMA  Additional Equipment:   Intra-op Plan:   Post-operative Plan:   Informed Consent: I have reviewed the patients History and Physical, chart, labs and discussed the procedure including the risks, benefits and alternatives for the proposed anesthesia with the patient or authorized representative who has indicated his/her understanding and acceptance.     Plan Discussed with: CRNA, Anesthesiologist and Surgeon  Anesthesia Plan Comments:         Anesthesia Quick Evaluation

## 2016-02-12 NOTE — Op Note (Signed)
Date of Surgery: 02/12/2016  INDICATIONS: Mary Reyes is a 55 y.o.-year-old female who sustained a left ankle fracture; she was indicated for open reduction and internal fixation due to the displaced nature of the articular fracture and came to the operating room today for this procedure. The patient did consent to the procedure after discussion of the risks and benefits.  PREOPERATIVE DIAGNOSIS: left trimalleolar ankle fracture  POSTOPERATIVE DIAGNOSIS: Same.  PROCEDURE: Open treatment of left ankle fracture with internal fixation. Trimalleolar w/o fixation of posterior malleolus CPT 27822.   SURGEON: N. Eduard Roux, M.D.  ASSIST: none.  ANESTHESIA:  general, regional  TOURNIQUET TIME: less than 90 mins  IV FLUIDS AND URINE: See anesthesia.  ESTIMATED BLOOD LOSS: minimal mL.  IMPLANTS: [employer]ker Variax 4 hole distal fibula plate  COMPLICATIONS: None.  DESCRIPTION OF PROCEDURE: The patient was brought to the operating room and placed supine on the operating table.  The patient had been signed prior to the procedure and this was documented. The patient had the anesthesia placed by the anesthesiologist.  A nonsterile tourniquet was placed on the upper thigh.  The prep verification and incision time-outs were performed to confirm that this was the correct patient, site, side and location. The patient had an SCD on the opposite lower extremity. The patient did receive antibiotics prior to the incision and was re-dosed during the procedure as needed at indicated intervals.  The patient had the lower extremity prepped and draped in the standard surgical fashion.  The extremity was exsanguinated using an esmarch bandage and the tourniquet was inflated to 300 mm Hg.  A lateral incision was created over the distal fibula. Dissection taken down to the fibula. Subperiosteal elevation was performed. The fracture was exposed. Organized hematoma and intractable periosteum were removed from the fracture  site. The fracture was then reduced and held with a tenaculum clamp. An anterior to posterior lag screw was then placed giving excellent purchase. The clamp was then removed. A precontoured distal fibula plate was placed on the lateral aspect of the fibula. Fluoroscopy was used to confirm appropriate placement. Nonlocking screws were placed proximal to the fracture. Unicortical locking screws were placed distal to the fracture. We then turned our attention to the medial malleolus. A medial incision over the tibial malleolus was created. Dissection was performed down to the fracture. Subperiosteal elevation was performed. Entrapped periosteum in the ankle joint and the fracture site was removed. There was no evidence of chondral damage to the talar dome. The fracture was then reduced and clamped with a tenaculum.  2 parallel K wires were advanced up the medial malleolus into the distal tibia. Fluoroscopy was used to confirm placement. I then placed a partially threaded 36 mm 4.0 cannulated screw over the anterior K wire to gain compression and then a second fully threaded screw over the posterior K wire for rotational stability. The clamp was removed. Final x-rays were taken. Stress view confirmed stability of the syndesmosis. The wounds were then thoroughly irrigated and closed in layer fashion using 0 Vicryl, 2-0 Vicryl, 3-0 nylon for the skin. Sterile dressings were applied. Foot was immobilized in a short leg splint. Patient tolerated the procedure well and no immediate competitions.  POSTOPERATIVE PLAN: Mary Reyes will remain nonweightbearing on this leg for approximately 6 weeks; Mary Reyes will return for suture removal in 2 weeks.  He will be immobilized in a short leg splint and then transitioned to a CAM walker at his first follow up appointment.  Mary Reyes will receive DVT prophylaxis based on other medications, activity level, and risk ratio of bleeding to thrombosis.  Mary Cecil, MD [place] 10:18 AM

## 2016-02-12 NOTE — Anesthesia Procedure Notes (Signed)
Procedure Name: LMA Insertion Date/Time: 02/12/2016 8:33 AM Performed by: Neev Mcmains D Pre-anesthesia Checklist: Patient identified, Emergency Drugs available, Suction available and Patient being monitored Patient Re-evaluated:Patient Re-evaluated prior to inductionOxygen Delivery Method: Circle system utilized Preoxygenation: Pre-oxygenation with 100% oxygen Intubation Type: IV induction Ventilation: Mask ventilation without difficulty LMA: LMA inserted LMA Size: 4.0 Number of attempts: 1 Airway Equipment and Method: Bite block Placement Confirmation: positive ETCO2 Tube secured with: Tape Dental Injury: Teeth and Oropharynx as per pre-operative assessment

## 2016-02-12 NOTE — Progress Notes (Signed)
Assisted Dr. Marcie Bal with left, ultrasound guided, popliteal, and adductor canal block. Side rails up, monitors on throughout procedure. See vital signs in flow sheet. Tolerated Procedure well.

## 2016-02-12 NOTE — Discharge Instructions (Signed)
° ° °  1. Keep splint clean and dry °2. Elevate foot above level of the heart °3. Take aspirin to prevent blood clots °4. Take pain meds as needed °5. Strict non weight bearing to operative extremity ° °

## 2016-02-12 NOTE — Anesthesia Postprocedure Evaluation (Signed)
Anesthesia Post Note  Patient: Mary Reyes  Procedure(s) Performed: Procedure(s) (LRB): OPEN REDUCTION INTERNAL FIXATION (ORIF) LEFT ANKLE FRACTURE (Left)  Patient location during evaluation: PACU Anesthesia Type: General Level of consciousness: awake and alert and patient cooperative Pain management: pain level controlled Vital Signs Assessment: post-procedure vital signs reviewed and stable Respiratory status: spontaneous breathing and respiratory function stable Cardiovascular status: stable Anesthetic complications: no    Last Vitals:  Vitals:   02/12/16 1045 02/12/16 1100  BP: (!) 181/85 (!) 174/81  Pulse: 98 90  Resp: 17 12  Temp:      Last Pain:  Vitals:   02/12/16 1100  TempSrc:   PainSc: 8     LLE Motor Response: Purposeful movement (02/12/16 1100) LLE Sensation: Decreased (feels "a little asleep") (02/12/16 1100)          Tynell Winchell S

## 2016-02-12 NOTE — Anesthesia Procedure Notes (Addendum)
Anesthesia Regional Block:  Popliteal block  Pre-Anesthetic Checklist: ,, timeout performed, Correct Patient, Correct Site, Correct Laterality, Correct Procedure, Correct Position, site marked, Risks and benefits discussed,  Surgical consent,  Pre-op evaluation,  At surgeon's request and post-op pain management  Laterality: Left  Prep: chloraprep       Needles:  Injection technique: Single-shot  Needle Type: Echogenic Stimulator Needle     Needle Length:cm 9 cm Needle Gauge: 21 G    Additional Needles:  Procedures: ultrasound guided (picture in chart) and nerve stimulator Popliteal block  Nerve Stimulator or Paresthesia:  Response: plantar flexion of foot, 0.45 mA,   Additional Responses:   Narrative:  Start time: 02/12/2016 7:50 AM End time: 02/12/2016 8:05 AM Injection made incrementally with aspirations every 5 mL.  Performed by: Personally  Anesthesiologist: Quamel Fitzmaurice  Additional Notes: Functioning IV was confirmed and monitors were applied.  A 43mm 21ga Arrow echogenic stimulator needle was used. Sterile prep and drape,hand hygiene and sterile gloves were used.  Negative aspiration and negative test dose prior to incremental administration of local anesthetic(20 mL 0.5% bupivacaine +epi). The patient tolerated the procedure well.  Ultrasound guidance: relevent anatomy identified, needle position confirmed, local anesthetic spread visualized around nerve(s), vascular puncture avoided.  Image printed for medical record.   Left adductor canal block also performed.  Ultrasound guidance.  Sterile technique.  20 mL of 0.5% bupivacaine +epi was injected at this site.

## 2016-02-12 NOTE — Anesthesia Preprocedure Evaluation (Addendum)
Anesthesia Evaluation  Patient identified by MRN, date of birth, ID band Patient awake    Reviewed: Allergy & Precautions, H&P , NPO status , Patient's Chart, lab work & pertinent test results  Airway Mallampati: II   Neck ROM: full    Dental   Pulmonary sleep apnea and Continuous Positive Airway Pressure Ventilation ,    breath sounds clear to auscultation       Cardiovascular hypertension, Pt. on medications  Rhythm:regular Rate:Normal     Neuro/Psych  Headaches, PSYCHIATRIC DISORDERS Anxiety Depression    GI/Hepatic GERD  Medicated,  Endo/Other  Hypothyroidism Morbid obesity  Renal/GU      Musculoskeletal  (+) Arthritis ,   Abdominal   Peds  Hematology   Anesthesia Other Findings   Reproductive/Obstetrics                            Anesthesia Physical Anesthesia Plan  ASA: II  Anesthesia Plan: General and Regional   Post-op Pain Management:  Regional for Post-op pain   Induction: Intravenous  Airway Management Planned: LMA  Additional Equipment:   Intra-op Plan:   Post-operative Plan:   Informed Consent: I have reviewed the patients History and Physical, chart, labs and discussed the procedure including the risks, benefits and alternatives for the proposed anesthesia with the patient or authorized representative who has indicated his/her understanding and acceptance.     Plan Discussed with: CRNA, Anesthesiologist and Surgeon  Anesthesia Plan Comments:         Anesthesia Quick Evaluation

## 2016-02-12 NOTE — H&P (Signed)
PREOPERATIVE H&P  Chief Complaint: Left trimalleolar ankle fracture  HPI: Mary Reyes is a 55 y.o. female who presents for surgical treatment of Left trimalleolar ankle fracture.  She denies any changes in medical history.  Past Medical History:  Diagnosis Date  . Allergy   . Anxiety   . Arthritis   . Blood in stool   . Colon polyp   . Depression   . Frequent headaches   . GERD (gastroesophageal reflux disease)   . Hypertension   . Hypothyroidism   . Migraine   . OSA (obstructive sleep apnea)    CPAP nightly  . Thyroid disease   . Trimalleolar fracture of ankle, closed, left, sequela    Past Surgical History:  Procedure Laterality Date  . ABDOMINAL HYSTERECTOMY  2004  . WISDOM TOOTH EXTRACTION     Social History   Social History  . Marital status: Married    Spouse name: N/A  . Number of children: 3  . Years of education: N/A   Occupational History  . [employer]    Social History Main Topics  . Smoking status: Never Smoker  . Smokeless tobacco: Never Used  . Alcohol use No  . Drug use: No  . Sexual activity: Not Asked   Other Topics Concern  . None   Social History Narrative   She lives with husband.  They have three grown children.   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    She is not on disability.     Highest level of education:  Some college   Family History  Problem Relation Age of Onset  . Healthy Mother   . Alcohol abuse Father     Deceased  . Stroke Maternal Grandmother   . Arthritis Paternal Grandmother   . Depression Daughter   . Heart murmur Daughter   . Fibromyalgia Daughter   . Migraines Son   . Brain cancer Sister   . Asthma Sister    Allergies  Allergen Reactions  . Adhesive [Tape] Rash  . Benzoyl Peroxide Rash  . Milk-Related Compounds Rash   Prior to Admission medications   Medication Sig Start Date End Date Taking? Authorizing Provider  B Complex Vitamins (B COMPLEX PO) Take 10  mLs by mouth daily.   Yes Historical Provider, MD  buPROPion (WELLBUTRIN) 75 MG tablet Take 75 mg by mouth 2 (two) times daily.    Yes Historical Provider, MD  cholecalciferol (VITAMIN D) 1000 units tablet Take 2,000 Units by mouth at bedtime.   Yes Historical Provider, MD  clobetasol ointment (TEMOVATE) [id number] % Apply 1 application topically 2 (two) times daily as needed (for rash).   Yes Historical Provider, MD  escitalopram (LEXAPRO) 20 MG tablet Take 30 mg by mouth daily.    Yes Historical Provider, MD  estradiol (ESTRACE) 0.1 MG/GM vaginal cream Place 1 Applicatorful vaginally 3 (three) times a week.   Yes Historical Provider, MD  fluticasone (FLONASE) 50 MCG/ACT nasal spray Place 2 sprays into both nostrils at bedtime.    Yes Historical Provider, MD  hydrochlorothiazide (HYDRODIURIL) 12.5 MG tablet Take 12.5 mg by mouth daily.   Yes Historical Provider, MD  ibuprofen (ADVIL,MOTRIN) 200 MG tablet Take 400 mg by mouth every 6 (six) hours as needed for headache, mild pain or moderate pain.   Yes Historical Provider, MD  L-METHYLFOL-B12-B6-LIPOIC ACID PO Take 10 mLs by mouth at bedtime.   Yes Historical Provider, MD  levocetirizine (XYZAL) 5 MG tablet Take 5 mg by mouth at bedtime.    Yes Historical Provider, MD  levothyroxine (SYNTHROID, LEVOTHROID) 100 MCG tablet Take 100 mcg by mouth every Sunday.   Yes Historical Provider, MD  levothyroxine (SYNTHROID, LEVOTHROID) 50 MCG tablet Take 50 mcg by mouth daily before breakfast. Pt DOES NOT take this dose on Sunday.   Yes Historical Provider, MD  liothyronine (CYTOMEL) 5 MCG tablet Take 7.5 mcg by mouth daily.   Yes Historical Provider, MD  oxyCODONE (OXY IR/ROXICODONE) 5 MG immediate release tablet Take 1-2 tablets (5-10 mg total) by mouth every 6 (six) hours as needed. 02/10/16  Yes Leandrew Koyanagi, MD  pantoprazole (PROTONIX) 40 MG tablet Take 40 mg by mouth at bedtime.    Yes Historical Provider, MD  phenylephrine (SUDAFED PE) 10 MG TABS tablet Take 10  mg by mouth every 4 (four) hours as needed (for congestion).   Yes Historical Provider, MD  ALPRAZolam Duanne Moron) 0.25 MG tablet Take 0.25 mg by mouth at bedtime as needed for anxiety or sleep.     Historical Provider, MD  cyclobenzaprine (FLEXERIL) 10 MG tablet Take 10 mg by mouth 3 (three) times daily as needed for muscle spasms.     Historical Provider, MD     Positive ROS: All other systems have been reviewed and were otherwise negative with the exception of those mentioned in the HPI and as above.  Physical Exam: General: Alert, no acute distress Cardiovascular: No pedal edema Respiratory: No cyanosis, no use of accessory musculature GI: abdomen soft Skin: No lesions in the area of chief complaint Neurologic: Sensation intact distally Psychiatric: Patient is competent for consent with normal mood and affect Lymphatic: no lymphedema  MUSCULOSKELETAL: exam stable  Assessment: Left trimalleolar ankle fracture  Plan: Plan for Procedure(s): OPEN REDUCTION INTERNAL FIXATION (ORIF) LEFT ANKLE FRACTURE  The risks benefits and alternatives were discussed with the patient including but not limited to the risks of nonoperative treatment, versus surgical intervention including infection, bleeding, nerve injury,  blood clots, cardiopulmonary complications, morbidity, mortality, among others, and they were willing to proceed.   Eduard Roux, MD   02/12/2016 7:58 AM

## 2016-02-12 NOTE — Transfer of Care (Signed)
Immediate Anesthesia Transfer of Care Note  Patient: Mary Reyes  Procedure(s) Performed: Procedure(s): OPEN REDUCTION INTERNAL FIXATION (ORIF) LEFT ANKLE FRACTURE (Left)  Patient Location: PACU  Anesthesia Type:GA combined with regional for post-op pain  Level of Consciousness: awake, alert , oriented and patient cooperative  Airway & Oxygen Therapy: Patient Spontanous Breathing and Patient connected to face mask oxygen  Post-op Assessment: Report given to RN and Post -op Vital signs reviewed and stable  Post vital signs: Reviewed and stable  Last Vitals:  Vitals:   02/12/16 0811 02/12/16 0812  BP: 116/65 116/65  Pulse: 76 83  Resp: 19 13  Temp:      Last Pain:  Vitals:   02/12/16 0724  TempSrc: Oral  PainSc: 7       Patients Stated Pain Goal: 1 ([id number])  Complications: No apparent anesthesia complications

## 2016-02-13 ENCOUNTER — Encounter (HOSPITAL_BASED_OUTPATIENT_CLINIC_OR_DEPARTMENT_OTHER): Payer: Self-pay | Admitting: Orthopaedic Surgery

## 2016-02-13 DIAGNOSIS — S82852A Displaced trimalleolar fracture of left lower leg, initial encounter for closed fracture: Secondary | ICD-10-CM | POA: Diagnosis not present

## 2016-02-17 ENCOUNTER — Ambulatory Visit (INDEPENDENT_AMBULATORY_CARE_PROVIDER_SITE_OTHER): Payer: Managed Care, Other (non HMO) | Admitting: Allergy and Immunology

## 2016-02-17 ENCOUNTER — Encounter (INDEPENDENT_AMBULATORY_CARE_PROVIDER_SITE_OTHER): Payer: Self-pay | Admitting: Orthopaedic Surgery

## 2016-02-17 ENCOUNTER — Encounter: Payer: Self-pay | Admitting: Allergy and Immunology

## 2016-02-17 ENCOUNTER — Ambulatory Visit (INDEPENDENT_AMBULATORY_CARE_PROVIDER_SITE_OTHER): Payer: Managed Care, Other (non HMO) | Admitting: Orthopaedic Surgery

## 2016-02-17 ENCOUNTER — Ambulatory Visit (INDEPENDENT_AMBULATORY_CARE_PROVIDER_SITE_OTHER): Payer: Managed Care, Other (non HMO)

## 2016-02-17 VITALS — BP 132/70 | HR 75 | Temp 99.1°F | Resp 14 | Ht 62.0 in | Wt 242.0 lb

## 2016-02-17 DIAGNOSIS — J309 Allergic rhinitis, unspecified: Secondary | ICD-10-CM

## 2016-02-17 DIAGNOSIS — S82852S Displaced trimalleolar fracture of left lower leg, sequela: Secondary | ICD-10-CM | POA: Diagnosis not present

## 2016-02-17 DIAGNOSIS — L500 Allergic urticaria: Secondary | ICD-10-CM | POA: Diagnosis not present

## 2016-02-17 DIAGNOSIS — S82852A Displaced trimalleolar fracture of left lower leg, initial encounter for closed fracture: Secondary | ICD-10-CM

## 2016-02-17 DIAGNOSIS — Z91018 Allergy to other foods: Secondary | ICD-10-CM | POA: Diagnosis not present

## 2016-02-17 MED ORDER — AZELASTINE-FLUTICASONE 137-50 MCG/ACT NA SUSP: 137.0000 ug | Freq: Two times a day (BID) | NASAL | 5 refills | Status: DC

## 2016-02-17 NOTE — Assessment & Plan Note (Addendum)
   For now, continue avoidance of cow's milk and milk products.  Fexofenadine 180 mg daily as needed.  We will proceed with allergy skin testing in the near future and make recommendations based on those results.

## 2016-02-17 NOTE — Progress Notes (Signed)
New Patient Note  RE: Mary Reyes MRN: [id number] DOB: [birth year] Date of Office Visit: 02/17/2016  Referring provider: Lin Landsman, MD Primary care provider: Kristine Garbe, MD  Chief Complaint: Urticaria; Pruritus; and Allergic Rhinitis    History of present illness: Mary Reyes is a 55 y.o. female seen today in consultation requested by Lin Landsman, MD.  She reports that approximately one year ago she began to experience generalized pruritus as well as occasional hives.  These symptoms were progressive.  She denies concomitant cardiopulmonary or GI symptoms.  She had serum specific IgE levels drawn which were equivocal/borderline positive to alpha lactalbumin and beta lactoglobulin.  Over the past month she has strictly eliminated cow's milk and dairy products from her diet and has noted symptom relief, though she is skeptical of the possibilities of placebo effect or mere coincidence.  She also complains of nasal congestion, rhinorrhea, and thick postnasal drainage. No significant seasonal symptom variation has been noted nor have specific environmental triggers been identified.  Fluticasone nasal spray has provided inadequate symptom relief.  She has been using cetirizine daily over the past year but believes that the benefit of this medication has diminished somewhat.  Due to her recent right lower extremity surgery she has asked to defer allergy skin testing to a later date.   Assessment and plan: Allergic rhinitis Allergy skin testing has been deferred to a later date per Yorley's request.  A prescription has been provided for Dymista (azelastine/fluticasone) nasal spray, 1 spray per nostril twice daily as needed. Proper nasal spray technique has been discussed and demonstrated.  Nasal saline lavage (NeilMed) as needed has been recommended prior to medicated nasal sprays and as needed along with instructions for proper administration.  Due to probable tachyphylaxis from prolonged  daily use of levocetirizine, I have recommended switching from levocetirizine to fexofenadine 180 mg daily as needed for now.  For thick post nasal drainage, add guaifenesin 1200 mg (Mucinex Maximum Strength)  twice daily as needed with adequate hydration as discussed.  The patient is scheduled to return in the near future for allergy skin testing.  Further recommendations will be made at that time based upon skin test results.  Pruritus/urticaria  For now, continue avoidance of cow's milk and milk products.  Fexofenadine 180 mg daily as needed.  We will proceed with allergy skin testing in the near future and make recommendations based on those results.   Meds ordered this encounter  Medications  . Azelastine-Fluticasone 137-50 MCG/ACT SUSP    Sig: Place 137 mcg into the nose 2 (two) times daily.    Dispense:  1 Bottle    Refill:  5    Physical examination: Blood pressure 132/70, pulse 75, temperature 99.1 F (37.3 C), temperature source Oral, resp. rate 14, height 5\' 2"  (1.575 m), weight 242 lb (109.8 kg), SpO2 95 %.  General: Alert, interactive, in no acute distress. HEENT: TMs pearly gray, turbinates edematous without discharge, post-pharynx moderately erythematous. Neck: Supple without lymphadenopathy. Lungs: Clear to auscultation without wheezing, rhonchi or rales. CV: Normal S1, S2 without murmurs. Abdomen: Nondistended, nontender. Skin: Warm and dry, without lesions or rashes. Extremities:  No clubbing, cyanosis or edema. Neuro:   Grossly intact.  Review of systems:  Review of systems negative except as noted in HPI / PMHx or noted below: Review of Systems  Constitutional: Negative.   HENT: Negative.   Eyes: Negative.   Respiratory: Negative.   Cardiovascular: Negative.   Gastrointestinal: Negative.   Genitourinary: Negative.  Musculoskeletal: Negative.   Skin: Negative.   Neurological: Negative.   Endo/Heme/Allergies: Negative.   Psychiatric/Behavioral:  Negative.     Past medical history:  Past Medical History:  Diagnosis Date  . Allergy   . Anxiety   . Arthritis   . Blood in stool   . Colon polyp   . Depression   . Frequent headaches   . GERD (gastroesophageal reflux disease)   . Hypertension   . Hypothyroidism   . Migraine   . OSA (obstructive sleep apnea)    CPAP nightly  . Thyroid disease   . Trimalleolar fracture of ankle, closed, left, sequela     Past surgical history:  Past Surgical History:  Procedure Laterality Date  . ABDOMINAL HYSTERECTOMY  2004  . ORIF ANKLE FRACTURE Left 02/12/2016   Procedure: OPEN REDUCTION INTERNAL FIXATION (ORIF) LEFT ANKLE FRACTURE;  Surgeon: Leandrew Koyanagi, MD;  Location: [facility];  Service: Orthopedics;  Laterality: Left;  . WISDOM TOOTH EXTRACTION      Family history: Family History  Problem Relation Age of Onset  . Healthy Mother   . Alcohol abuse Father     Deceased  . Stroke Maternal Grandmother   . Arthritis Paternal Grandmother   . Depression Daughter   . Heart murmur Daughter   . Allergic rhinitis Daughter   . Fibromyalgia Daughter   . Allergic rhinitis Daughter   . Migraines Son   . Allergic rhinitis Son   . Brain cancer Sister   . Asthma Sister   . Angioedema Neg Hx   . Eczema Neg Hx   . Immunodeficiency Neg Hx   . Urticaria Neg Hx     Social history: Social History   Social History  . Marital status: Married    Spouse name: N/A  . Number of children: 3  . Years of education: N/A   Occupational History  . Di[employer]bled    Social History Main Topics  . Smoking status: Never Smoker  . Smokeless tobacco: Never Used  . Alcohol use No  . Drug use: No  . Sexual activity: Not on file   Other Topics Concern  . Not on file   Social History Narrative   She lives with husband.  They have three grown children.   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    She is not on disability.     Highest level of  education:  Some college   Environmental History: The patient lives in a 55 year old house with carpeting throughout, gas heat, and central air.  There is a dog in house which has access to her bedroom.  She is a nonsmoker.    Medication List       Accurate as of 02/17/16  8:00 PM. Always use your most recent med list.          ALPRAZolam 0.25 MG tablet Commonly known as:  XANAX Take 0.25 mg by mouth at bedtime as needed for anxiety or sleep.   aspirin EC 325 MG tablet Take 1 tablet (325 mg total) by mouth 2 (two) times daily.   Azelastine-Fluticasone 137-50 MCG/ACT Susp Place 137 mcg into the nose 2 (two) times daily.   B COMPLEX PO Take 10 mLs by mouth daily.   buPROPion 75 MG tablet Commonly known as:  WELLBUTRIN Take 75 mg by mouth 2 (two) times daily.   calcium-vitamin D 500-200 MG-UNIT tablet Commonly known as:  OSCAL WITH  D Take 1 tablet by mouth 3 (three) times daily.   cholecalciferol 1000 units tablet Commonly known as:  VITAMIN D Take 2,000 Units by mouth at bedtime.   clobetasol ointment 0.05 % Commonly known as:  TEMOVATE Apply 1 application topically 2 (two) times daily as needed (for rash).   cyclobenzaprine 10 MG tablet Commonly known as:  FLEXERIL Take 10 mg by mouth 3 (three) times daily as needed for muscle spasms.   escitalopram 20 MG tablet Commonly known as:  LEXAPRO Take 30 mg by mouth daily.   estradiol 0.1 MG/GM vaginal cream Commonly known as:  ESTRACE Place 1 Applicatorful vaginally 3 (three) times a week.   fluticasone 50 MCG/ACT nasal spray Commonly known as:  FLONASE Place 2 sprays into both nostrils at bedtime.   hydrochlorothiazide 12.5 MG tablet Commonly known as:  HYDRODIURIL Take 12.5 mg by mouth daily.   ibuprofen 200 MG tablet Commonly known as:  ADVIL,MOTRIN Take 400 mg by mouth every 6 (six) hours as needed for headache, mild pain or moderate pain.   L-METHYLFOL-B12-B6-LIPOIC ACID PO Take 10 mLs by mouth at  bedtime.   levocetirizine 5 MG tablet Commonly known as:  XYZAL Take 5 mg by mouth at bedtime.   levothyroxine 50 MCG tablet Commonly known as:  SYNTHROID, LEVOTHROID Take 50 mcg by mouth daily before breakfast. Pt DOES NOT take this dose on Sunday.   levothyroxine 100 MCG tablet Commonly known as:  SYNTHROID, LEVOTHROID Take 100 mcg by mouth every Sunday.   liothyronine 5 MCG tablet Commonly known as:  CYTOMEL Take 7.5 mcg by mouth daily.   methocarbamol 750 MG tablet Commonly known as:  ROBAXIN Take 1 tablet (750 mg total) by mouth 2 (two) times daily as needed for muscle spasms.   ondansetron 4 MG tablet Commonly known as:  ZOFRAN Take 1-2 tablets (4-8 mg total) by mouth every 8 (eight) hours as needed for nausea or vomiting.   oxyCODONE 5 MG immediate release tablet Commonly known as:  Oxy IR/ROXICODONE Take 1-2 tablets (5-10 mg total) by mouth every 6 (six) hours as needed.   oxyCODONE 5 MG immediate release tablet Commonly known as:  Oxy IR/ROXICODONE Take 1-3 tablets (5-15 mg total) by mouth every 4 (four) hours as needed.   oxyCODONE 10 mg 12 hr tablet Commonly known as:  OXYCONTIN Take 1 tablet (10 mg total) by mouth every 12 (twelve) hours.   pantoprazole 40 MG tablet Commonly known as:  PROTONIX Take 40 mg by mouth at bedtime.   phenylephrine 10 MG Tabs tablet Commonly known as:  SUDAFED PE Take 10 mg by mouth every 4 (four) hours as needed (for congestion).   promethazine 25 MG tablet Commonly known as:  PHENERGAN Take 1 tablet (25 mg total) by mouth every 6 (six) hours as needed for nausea.   senna-docusate 8.6-50 MG tablet Commonly known as:  SENOKOT S Take 1 tablet by mouth at bedtime as needed.       Known medication allergies: Allergies  Allergen Reactions  . Adhesive [Tape] Rash  . Benzoyl Peroxide Rash  . Milk-Related Compounds Rash    I appreciate the opportunity to take part in Careen's care. Please do not hesitate to contact me  with questions.  Sincerely,   R. Edgar Frisk, MD

## 2016-02-17 NOTE — Progress Notes (Deleted)
   Office Visit Note   Patient: Mary Reyes           Date of Birth: 11/10/1960           MRN: [id number] Visit Date: 02/17/2016              Requested by: Lin Landsman, MD [phone], [place] [address] PCP: Kristine Garbe, MD   Assessment & Plan: Visit Diagnoses: No diagnosis found.  Plan: ***  Follow-Up Instructions: No Follow-up on file.   Orders:  No orders of the defined types were placed in this encounter.  No orders of the defined types were placed in this encounter.     Procedures: No procedures performed   Clinical Data: No additional findings.   Subjective: Chief Complaint  Patient presents with  . Left Ankle - Routine Post Op    HPI  Review of Systems   Objective: Vital Signs: There were no vitals taken for this visit.  Physical Exam  Ortho Exam  Specialty Comments:  No specialty comments available.  Imaging: No results found.   PMFS History: Patient Active Problem List   Diagnosis Date Noted  . Pruritus/urticaria 02/17/2016  . S/P ORIF (open reduction internal fixation) fracture 02/12/2016  . Displaced trimalleolar fracture of left lower leg, initial encounter for closed fracture 02/10/2016  . Heterozygous MTHFR mutation C677T ([place]) 01/21/2016  . Vitamin D deficiency 08/15/2014  . Osteopenia 08/15/2014  . OSA (obstructive sleep apnea) 08/07/2014  . Essential hypertension 06/27/2014  . GERD (gastroesophageal reflux disease) 06/27/2014  . Hypothyroidism 06/27/2014  . Depression 06/27/2014  . Allergic rhinitis 06/27/2014   Past Medical History:  Diagnosis Date  . Allergy   . Anxiety   . Arthritis   . Blood in stool   . Colon polyp   . Depression   . Frequent headaches   . GERD (gastroesophageal reflux disease)   . Hypertension   . Hypothyroidism   . Migraine   . OSA (obstructive sleep apnea)    CPAP nightly  . Thyroid disease   . Trimalleolar fracture of ankle, closed, left, sequela     Family  History  Problem Relation Age of Onset  . Healthy Mother   . Alcohol abuse Father     Deceased  . Stroke Maternal Grandmother   . Arthritis Paternal Grandmother   . Depression Daughter   . Heart murmur Daughter   . Allergic rhinitis Daughter   . Fibromyalgia Daughter   . Allergic rhinitis Daughter   . Migraines Son   . Allergic rhinitis Son   . Brain cancer Sister   . Asthma Sister   . Angioedema Neg Hx   . Eczema Neg Hx   . Immunodeficiency Neg Hx   . Urticaria Neg Hx     Past Surgical History:  Procedure Laterality Date  . ABDOMINAL HYSTERECTOMY  2004  . ORIF ANKLE FRACTURE Left 02/12/2016   Procedure: OPEN REDUCTION INTERNAL FIXATION (ORIF) LEFT ANKLE FRACTURE;  Surgeon: Leandrew Koyanagi, MD;  Location: [facility];  Service: Orthopedics;  Laterality: Left;  . WISDOM TOOTH EXTRACTION     Social History   Occupational History  . [employer]    Social History Main Topics  . Smoking status: Never Smoker  . Smokeless tobacco: Never Used  . Alcohol use No  . Drug use: No  . Sexual activity: Not on file

## 2016-02-17 NOTE — Patient Instructions (Addendum)
Allergic rhinitis Allergy skin testing has been deferred to a later date per Mary Reyes's request.  A prescription has been provided for Dymista (azelastine/fluticasone) nasal spray, 1 spray per nostril twice daily as needed. Proper nasal spray technique has been discussed and demonstrated.  Nasal saline lavage (NeilMed) as needed has been recommended prior to medicated nasal sprays and as needed along with instructions for proper administration.  Due to probable tachyphylaxis from prolonged daily use of levocetirizine, I have recommended switching from levocetirizine to fexofenadine 180 mg daily as needed for now.  For thick post nasal drainage, add guaifenesin 1200 mg (Mucinex Maximum Strength)  twice daily as needed with adequate hydration as discussed.  The patient is scheduled to return in the near future for allergy skin testing.  Further recommendations will be made at that time based upon skin test results.  Pruritus/urticaria  For now, continue avoidance of cow's milk and milk products.  Fexofenadine 180 mg daily as needed.  We will proceed with allergy skin testing in the near future and make recommendations based on those results.   Return for allergy skin testing.

## 2016-02-17 NOTE — Assessment & Plan Note (Addendum)
Allergy skin testing has been deferred to a later date per Seema's request.  A prescription has been provided for Dymista (azelastine/fluticasone) nasal spray, 1 spray per nostril twice daily as needed. Proper nasal spray technique has been discussed and demonstrated.  Nasal saline lavage (NeilMed) as needed has been recommended prior to medicated nasal sprays and as needed along with instructions for proper administration.  Due to probable tachyphylaxis from prolonged daily use of levocetirizine, I have recommended switching from levocetirizine to fexofenadine 180 mg daily as needed for now.  For thick post nasal drainage, add guaifenesin 1200 mg (Mucinex Maximum Strength)  twice daily as needed with adequate hydration as discussed.  The patient is scheduled to return in the near future for allergy skin testing.  Further recommendations will be made at that time based upon skin test results.

## 2016-02-17 NOTE — Progress Notes (Signed)
Mr. Artuso comes in today for a wound check. She feels like the lateral incision is really tight.  The splint was removed and both incisions were clean dry and intact. The lateral incision had a little bit of surrounding erythema but no drainage or any signs of infection.  Calf was nontender. She does have expected postoperative swelling. We rewrapped the ankle reassurance was given that everything looks good. Cam walker was given to facilitate ice and elevation. Follow up as scheduled for 2 week postop visit with 3 view x-rays of the left ankle

## 2016-02-19 ENCOUNTER — Telehealth: Payer: Self-pay | Admitting: Internal Medicine

## 2016-02-19 NOTE — Telephone Encounter (Signed)
Kristen from [facility] called and stated that Patient do not need to see a geneticist, please give her a call, she want to inform you on some thing pertaing her dx.. Please advise [phone]

## 2016-02-27 ENCOUNTER — Ambulatory Visit (INDEPENDENT_AMBULATORY_CARE_PROVIDER_SITE_OTHER): Payer: Self-pay | Admitting: Orthopaedic Surgery

## 2016-02-27 ENCOUNTER — Ambulatory Visit (INDEPENDENT_AMBULATORY_CARE_PROVIDER_SITE_OTHER): Payer: Managed Care, Other (non HMO)

## 2016-02-27 ENCOUNTER — Encounter (INDEPENDENT_AMBULATORY_CARE_PROVIDER_SITE_OTHER): Payer: Self-pay | Admitting: Orthopaedic Surgery

## 2016-02-27 DIAGNOSIS — S82852D Displaced trimalleolar fracture of left lower leg, subsequent encounter for closed fracture with routine healing: Secondary | ICD-10-CM | POA: Diagnosis not present

## 2016-02-27 DIAGNOSIS — S82852A Displaced trimalleolar fracture of left lower leg, initial encounter for closed fracture: Secondary | ICD-10-CM

## 2016-02-27 MED ORDER — HYDROCODONE-ACETAMINOPHEN 5-325 MG PO TABS: 1.0000 | ORAL_TABLET | Freq: Three times a day (TID) | ORAL | 0 refills | Status: DC | PRN

## 2016-02-27 NOTE — Telephone Encounter (Signed)
I called and d/w her.

## 2016-02-27 NOTE — Progress Notes (Signed)
Office Visit Note   Patient: Mary Reyes           Date of Birth: [birth year]           MRN: [id number] Visit Date: 02/27/2016              Requested by: Lin Landsman, MD [phone], [place] [address] PCP: Kristine Garbe, MD   Assessment & Plan: Visit Diagnoses:  1. Displaced trimalleolar fracture of left lower leg, initial encounter for closed fracture     Plan:  - sutures removed - CAM walker - continue NWB, may dc aspirin - gentle ROM at home - norco filled - f/u 4 weeks with repeat xrays  Follow-Up Instructions: Return in about 4 weeks (around 03/26/2016).   Orders:  Orders Placed This Encounter  Procedures  . XR Ankle Complete Left   Meds ordered this encounter  Medications  . HYDROcodone-acetaminophen (NORCO) 5-325 MG tablet    Sig: Take 1 tablet by mouth 3 (three) times daily as needed.    Dispense:  30 tablet    Refill:  0      Procedures: No procedures performed   Clinical Data: No additional findings.   Subjective: Chief Complaint  Patient presents with  . Left Ankle - Routine Post Op, Follow-up    HPI 2 week postop visit.  Doing well.  No significant pain or complaints. Review of Systems   Objective: Vital Signs: There were no vitals taken for this visit.  Physical Exam  Ortho Exam Incisions healed.  Mild swelling.  No signs of infection. Specialty Comments:  No specialty comments available.  Imaging: Xr Ankle Complete Left  Result Date: 02/27/2016 Stable fixation of ankle fracture    PMFS History: Patient Active Problem List   Diagnosis Date Noted  . Pruritus/urticaria 02/17/2016  . History of food allergy 02/17/2016  . S/P ORIF (open reduction internal fixation) fracture 02/12/2016  . Displaced trimalleolar fracture of left lower leg, initial encounter for closed fracture 02/10/2016  . Heterozygous MTHFR mutation C677T ([place]) 01/21/2016  . Vitamin D deficiency 08/15/2014  . Osteopenia 08/15/2014    . OSA (obstructive sleep apnea) 08/07/2014  . Essential hypertension 06/27/2014  . GERD (gastroesophageal reflux disease) 06/27/2014  . Hypothyroidism 06/27/2014  . Depression 06/27/2014  . Allergic rhinitis 06/27/2014   Past Medical History:  Diagnosis Date  . Allergy   . Anxiety   . Arthritis   . Blood in stool   . Colon polyp   . Depression   . Frequent headaches   . GERD (gastroesophageal reflux disease)   . Hypertension   . Hypothyroidism   . Migraine   . OSA (obstructive sleep apnea)    CPAP nightly  . Thyroid disease   . Trimalleolar fracture of ankle, closed, left, sequela     Family History  Problem Relation Age of Onset  . Healthy Mother   . Alcohol abuse Father     Deceased  . Stroke Maternal Grandmother   . Arthritis Paternal Grandmother   . Depression Daughter   . Heart murmur Daughter   . Allergic rhinitis Daughter   . Fibromyalgia Daughter   . Allergic rhinitis Daughter   . Migraines Son   . Allergic rhinitis Son   . Brain cancer Sister   . Asthma Sister   . Angioedema Neg Hx   . Eczema Neg Hx   . Immunodeficiency Neg Hx   . Urticaria Neg Hx     Past  Surgical History:  Procedure Laterality Date  . ABDOMINAL HYSTERECTOMY  2004  . ORIF ANKLE FRACTURE Left 02/12/2016   Procedure: OPEN REDUCTION INTERNAL FIXATION (ORIF) LEFT ANKLE FRACTURE;  Surgeon: Leandrew Koyanagi, MD;  Location: [facility];  Service: Orthopedics;  Laterality: Left;  . WISDOM TOOTH EXTRACTION     Social History   Occupational History  . [employer]    Social History Main Topics  . Smoking status: Never Smoker  . Smokeless tobacco: Never Used  . Alcohol use No  . Drug use: No  . Sexual activity: Not on file

## 2016-03-04 ENCOUNTER — Encounter: Payer: Self-pay | Admitting: Internal Medicine

## 2016-03-17 ENCOUNTER — Encounter: Payer: Managed Care, Other (non HMO) | Admitting: Internal Medicine

## 2016-03-26 ENCOUNTER — Other Ambulatory Visit: Payer: Self-pay

## 2016-03-26 ENCOUNTER — Ambulatory Visit (INDEPENDENT_AMBULATORY_CARE_PROVIDER_SITE_OTHER): Payer: Managed Care, Other (non HMO)

## 2016-03-26 ENCOUNTER — Ambulatory Visit (INDEPENDENT_AMBULATORY_CARE_PROVIDER_SITE_OTHER): Payer: Self-pay | Admitting: Orthopaedic Surgery

## 2016-03-26 DIAGNOSIS — S82852D Displaced trimalleolar fracture of left lower leg, subsequent encounter for closed fracture with routine healing: Secondary | ICD-10-CM

## 2016-03-26 DIAGNOSIS — S82852A Displaced trimalleolar fracture of left lower leg, initial encounter for closed fracture: Secondary | ICD-10-CM

## 2016-03-26 NOTE — Telephone Encounter (Signed)
Fax request for dymista in a 90 day supply. Patient stated previously that she was not taking.

## 2016-03-26 NOTE — Progress Notes (Signed)
Patient is doing well.  She's 6 weeks s/p ORIF left trimall ankle fx.  Takes norco occasionally.  Exam is stable.  xrays show healed fx.  Begin WBAT in CAM.  PT referral made.  F/u 6 weeks with repeat ankle xrays.

## 2016-03-31 ENCOUNTER — Telehealth (INDEPENDENT_AMBULATORY_CARE_PROVIDER_SITE_OTHER): Payer: Self-pay | Admitting: Orthopaedic Surgery

## 2016-03-31 NOTE — Telephone Encounter (Signed)
Pt called and asked what the phys therapy place phone number is that she was referred to, doesn't know where or have any information regarding this. Pt number I [phone]

## 2016-04-01 NOTE — Telephone Encounter (Signed)
[facility] called her and is aware

## 2016-04-06 ENCOUNTER — Ambulatory Visit: Payer: Managed Care, Other (non HMO) | Attending: Orthopaedic Surgery | Admitting: Physical Therapy

## 2016-04-06 ENCOUNTER — Encounter: Payer: Self-pay | Admitting: Physical Therapy

## 2016-04-06 DIAGNOSIS — R262 Difficulty in walking, not elsewhere classified: Secondary | ICD-10-CM | POA: Insufficient documentation

## 2016-04-06 DIAGNOSIS — M6281 Muscle weakness (generalized): Secondary | ICD-10-CM | POA: Diagnosis not present

## 2016-04-06 DIAGNOSIS — R600 Localized edema: Secondary | ICD-10-CM | POA: Insufficient documentation

## 2016-04-06 NOTE — Therapy (Signed)
[facility] [address] [place], [place], [address] Phone: [phone]   Fax:  [phone]  Physical Therapy Evaluation  Patient Details  Name: Mary Reyes MRN: [id number] Date of Birth: October 30, 1960 Referring Provider: Leandrew Koyanagi, MD  Encounter Date: 04/06/2016      PT End of Session - 04/06/16 1427    Visit Number 1   Date for PT Re-Evaluation 06/29/16   PT Start Time 1232   PT Stop Time 1320   PT Time Calculation (min) 48 min   Activity Tolerance Patient tolerated treatment well   Behavior During Therapy [facility] for tasks assessed/performed      Past Medical History:  Diagnosis Date  . Allergy   . Anxiety   . Arthritis   . Blood in stool   . Colon polyp   . Depression   . Frequent headaches   . GERD (gastroesophageal reflux disease)   . Hypertension   . Hypothyroidism   . Migraine   . OSA (obstructive sleep apnea)    CPAP nightly  . Thyroid disease   . Trimalleolar fracture of ankle, closed, left, sequela     Past Surgical History:  Procedure Laterality Date  . ABDOMINAL HYSTERECTOMY  2004  . ORIF ANKLE FRACTURE Left 02/12/2016   Procedure: OPEN REDUCTION INTERNAL FIXATION (ORIF) LEFT ANKLE FRACTURE;  Surgeon: Leandrew Koyanagi, MD;  Location: [place];  Service: Orthopedics;  Laterality: Left;  . WISDOM TOOTH EXTRACTION      There were no vitals filed for this visit.       Subjective Assessment - 04/06/16 1233    Subjective Trimalleolar fx on 02/08/16.  Pt saw doctor on 03/26/16 who said she can be WBAT in CAM boot.  States she does have poor balance. muscle spasms and pain in hip due to sitting all the time.   Pertinent History osteopenia, fall precaution, WBAT in CAM boot, s/p ORIF    Limitations Standing;Walking   How long can you stand comfortably? one minute   How long can you walk comfortably? doctor not allowed to walk until seeing PT, was using knee walker   Patient  Stated Goals be able to go grocery shopping, cleaning, and do all normal activities   Currently in Pain? Yes   Pain Score 8   muscle spasms are worse than hip pain   Pain Location Hip   Pain Orientation Left   Pain Descriptors / Indicators Cramping   Pain Type Chronic pain   Pain Radiating Towards down both legs - cramping   Pain Onset More than a month ago   Pain Frequency Intermittent   Aggravating Factors  sitting on one side too much   Pain Relieving Factors switching sides   Effect of Pain on Daily Activities cannot walking and do normal activities   Multiple Pain Sites No            [facility] PT Assessment - 04/06/16 0001      Assessment   Medical Diagnosis S82.852 Lt trimalleolar fx   Referring Provider Leandrew Koyanagi, MD   Onset Date/Surgical Date 02/08/16   Next MD Visit f/u 6 weeks   Prior Therapy no      Precautions   Precautions Fall     Restrictions   Weight Bearing Restrictions Yes   LLE Weight Bearing Weight bearing as tolerated   Other Position/Activity Restrictions wear CAM boot     Balance Screen   Has the patient  fallen in the past 6 months Yes   How many times? 15x   Has the patient had a decrease in activity level because of a fear of falling?  No   Is the patient reluctant to leave their home because of a fear of falling?  No     Home Ecologist residence   Living Arrangements Spouse/significant other   Home Access Stairs to enter  up 7 step each level x 2 levels inside   Entrance Stairs-Number of Steps 15   Entrance Stairs-Rails Right;Left   Home Layout Multi-level     Prior Function   Level of Independence Independent  not currently ue to WB status   Vocation Unemployed   Leisure quilting     Cognition   Overall Cognitive Status Within Functional Limits for tasks assessed     Observation/Other Assessments   Focus on Therapeutic Outcomes (FOTO)  74% limitation  goal 54%     Observation/Other  Assessments-Edema    Edema Figure 8     Figure 8 Edema   Figure 8 - Right  51.5 cm   Figure 8 - Left  55cm     Posture/Postural Control   Posture/Postural Control Postural limitations   Postural Limitations Flexed trunk;Rounded Shoulders     AROM   Right Ankle Dorsiflexion 12   Right Ankle Plantar Flexion 70   Right Ankle Inversion 36   Right Ankle Eversion 55   Left Ankle Dorsiflexion 2   Left Ankle Plantar Flexion 40   Left Ankle Inversion 22   Left Ankle Eversion 10     Strength   Right Ankle Dorsiflexion 5/5   Right Ankle Plantar Flexion 5/5   Right Ankle Inversion 5/5   Right Ankle Eversion 5/5   Left Ankle Dorsiflexion 4-/5   Left Ankle Plantar Flexion 4-/5   Left Ankle Inversion 4-/5   Left Ankle Eversion 4-/5     Ambulation/Gait   Ambulation/Gait Yes   Ambulation/Gait Assistance 6: Modified independent (Device/Increase time)   Ambulation Distance (Feet) 50 Feet   Assistive device Rolling walker   Gait Pattern Step-to pattern  step through with cues   Ambulation Surface Level;Indoor     Functional Gait  Assessment   Gait assessed  --                   [facility] Adult PT Treatment/Exercise - 04/06/16 0001      Ambulation/Gait   Gait Comments cues for upright posture and step through gait, educated on using RW for increased steadiness due to decreased balance     Ankle Exercises: Seated   Other Seated Ankle Exercises inversion, eversion, dorsiflexion, plantarflexion - yellow band - 10x each                PT Education - 04/06/16 1806    Education provided Yes   Education Details ankle 4 ways with band, glute squeeze   Person(s) Educated Patient   Methods Explanation;Demonstration;Handout   Comprehension Verbalized understanding;Returned demonstration          PT Short Term Goals - 04/06/16 1828      PT SHORT TERM GOAL #1   Title independent with initial HEP   Time 4   Period Weeks   Status New     PT SHORT TERM GOAL #2    Title able to walk with LRAD around grocery store   Time 4   Period Weeks   Status New  PT SHORT TERM GOAL #3   Title able to walk up and down stairs in her home with supervision and one UE support   Time 4   Period Weeks   Status New           PT Long Term Goals - 04/06/16 1831      PT LONG TERM GOAL #1   Title able to stand long enough in order to prepare and cook a meal without stopping due to fatigue or pain   Time 12   Period Weeks   Status New     PT LONG TERM GOAL #2   Title FOTO < or= to 54% limitation   Baseline 74%   Time 12   Period Weeks   Status New     PT LONG TERM GOAL #3   Title Pt demonstrates MMT 5/5 Lt ankle in all directions for improved stability during gait.   Time 12   Period Weeks   Status New     PT LONG TERM GOAL #4   Title Pt will demonstrate TUG without AD in < 12 sec to demonstrate reduced risk of falls   Time 12   Period Weeks   Status New     PT LONG TERM GOAL #5   Title Pt will demonstrate gait WFL without CAM boot after patient's doctor discharges CAM boot with weight bearing activities.   Time 12   Period Weeks   Status New               Plan - 04/06/16 1820    Clinical Impression Statement Pt was seen for low complexity eval due to lack of comorbidities effecting rehab potential.  Pt demonstrates decreased balance, has history of falls and osteopenia.  Pt has reduced ROM of at least 50% in Lt ankle and strength 4-/5 throughout Lt ankle.  Pt demonstrates flexed posture during gait and will need.  Pt is s/p ORIF Lt ankle and has not been ambulatory for 8 weeks.  Pt will need skilled PT to address impairments and return to full weight bearing as tolerated and return patient to (I)ADLs without increased risk of falls or injury.    Rehab Potential Good   Clinical Impairments Affecting Rehab Potential osteopenia, fall precaution, WBAT in CAM boot, s/p ORIF    PT Frequency 2x / week   PT Duration 12 weeks   PT  Treatment/Interventions ADLs/Self Care Home Management;Cryotherapy;Electrical Stimulation;Moist Heat;Neuromuscular re-education;Balance training;Therapeutic exercise;Therapeutic activities;Stair training;Gait training;Patient/family education;Manual techniques;Scar mobilization;Passive range of motion;Dry needling;Taping;Vasopneumatic Device   PT Next Visit Plan TUG and 5x sit to stand; work on weight shifting, gait, balance, ankle and LE strengthening   Recommended Other Services none   Consulted and Agree with Plan of Care Patient      Patient will benefit from skilled therapeutic intervention in order to improve the following deficits and impairments:  Abnormal gait, Decreased balance, Decreased coordination, Decreased activity tolerance, Decreased endurance, Decreased range of motion, Decreased scar mobility, Decreased strength, Difficulty walking, Increased fascial restricitons, Postural dysfunction, Pain, Increased muscle spasms, Increased edema  Visit Diagnosis: Muscle weakness (generalized) - Plan: PT plan of care cert/re-cert  Difficulty in walking, not elsewhere classified - Plan: PT plan of care cert/re-cert  Localized edema - Plan: PT plan of care cert/re-cert     Problem List Patient Active Problem List   Diagnosis Date Noted  . Pruritus/urticaria 02/17/2016  . History of food allergy 02/17/2016  . S/P ORIF (open reduction internal fixation) fracture  02/12/2016  . Displaced trimalleolar fracture of left lower leg, initial encounter for closed fracture 02/10/2016  . Heterozygous MTHFR mutation C677T ([place]) 01/21/2016  . Vitamin D deficiency 08/15/2014  . Osteopenia 08/15/2014  . OSA (obstructive sleep apnea) 08/07/2014  . Essential hypertension 06/27/2014  . GERD (gastroesophageal reflux disease) 06/27/2014  . Hypothyroidism 06/27/2014  . Depression 06/27/2014  . Allergic rhinitis 06/27/2014    Zannie Cove, PT 04/06/2016, 6:42 PM  [facility] [address] [place], [place], [address] Phone: [phone]   Fax:  [phone]  Name: Mary Reyes MRN: [id number] Date of Birth: 1960/11/16

## 2016-04-06 NOTE — Patient Instructions (Signed)
Dorsiflexion (Eccentric), (Resistance Band)    Pull foot up against resistance band. Slowly release for 3-5 seconds. Use ________ resistance band. ___ reps per set, ___ sets per day, ___ days per week.  http://ecce.exer.us/1   Copyright  VHI. All rights reserved.  Eversion: Resisted    With right foot in tubing loop, hold tubing around other foot to resist and turn foot out. Repeat __10__ times per set. Do __3__ sets per session. Do __1__ sessions per day.  http://orth.exer.us/15   Copyright  VHI. All rights reserved.  ANKLE: Inversion, Unilateral (Band)    Placing band around left foot. Keeping heel in place, lift toes of banded foot up and in. Do not move hip. Hold _3__ seconds. Use ___yellow_____ band. __10_ reps per set, _3__ sets per day,  Copyright  VHI. All rights reserved.  Gluteal Sets    Tighten buttocks while pressing pelvis to floor. Hold _5___ seconds. Repeat _10___ times per set. Do __3__ sets per session. Do ___3_ sessions per day.  http://orth.exer.us/105   Copyright  VHI. All rights reserved.   Practice walking and weight bearing as tolerated - shift weight into Left foot, walk 5 minutes several times per day with Rolling walker

## 2016-04-09 ENCOUNTER — Encounter: Payer: Self-pay | Admitting: Physical Therapy

## 2016-04-09 ENCOUNTER — Ambulatory Visit: Payer: Managed Care, Other (non HMO) | Admitting: Physical Therapy

## 2016-04-09 DIAGNOSIS — M6281 Muscle weakness (generalized): Secondary | ICD-10-CM | POA: Diagnosis not present

## 2016-04-09 DIAGNOSIS — R600 Localized edema: Secondary | ICD-10-CM

## 2016-04-09 NOTE — Therapy (Signed)
[facility] [address], [place], [place], [address] Phone: [phone]   Fax:  [phone]  Physical Therapy Treatment  Patient Details  Name: Mary Reyes MRN: [id number] Date of Birth: [birth year] Referring Provider: Leandrew Koyanagi, MD  Encounter Date: 04/09/2016      PT End of Session - 04/09/16 1717    Visit Number 2   Date for PT Re-Evaluation 06/29/16   PT Start Time [id number]   PT Stop Time 1703   PT Time Calculation (min) 47 min   Activity Tolerance Patient tolerated treatment well   Behavior During Therapy [facility] for tasks assessed/performed      Past Medical History:  Diagnosis Date  . Allergy   . Anxiety   . Arthritis   . Blood in stool   . Colon polyp   . Depression   . Frequent headaches   . GERD (gastroesophageal reflux disease)   . Hypertension   . Hypothyroidism   . Migraine   . OSA (obstructive sleep apnea)    CPAP nightly  . Thyroid disease   . Trimalleolar fracture of ankle, closed, left, sequela     Past Surgical History:  Procedure Laterality Date  . ABDOMINAL HYSTERECTOMY  2004  . ORIF ANKLE FRACTURE Left 02/12/2016   Procedure: OPEN REDUCTION INTERNAL FIXATION (ORIF) LEFT ANKLE FRACTURE;  Surgeon: Leandrew Koyanagi, MD;  Location: [place];  Service: Orthopedics;  Laterality: Left;  . WISDOM TOOTH EXTRACTION      There were no vitals filed for this visit.      Subjective Assessment - 04/09/16 1627    Subjective States she is feeling better walking.  States she really wants to work on stairs today. Reports her muscles are bothering her, but the ankle feels fine.  States ankle gets sore after activities but in generally ankle is okay.     Pertinent History osteopenia, fall precaution, WBAT in CAM boot, s/p ORIF    Limitations Standing;Walking   Currently in Pain? No/denies                         [facility] Adult PT Treatment/Exercise - 04/09/16 0001      Knee/Hip Exercises: Standing   Hip Abduction Stengthening;Right;Left;2 sets;5 sets   Hip Extension Stengthening;Right;Left;2 sets;5 reps   Lateral Step Up 10 reps;Step Height: 2";20 reps;Step Height: 4"   Functional Squat 2 sets;10 reps  quarter squat   Functional Squat Limitations place 2" step under Rt foot to balance CAM boot   Gait Training one lap around gym - demonstrates improved cadence and reciprocal gait pattern; stairs able to ascend steps reciprocally with B UE support, descends one step at a time.   Other Standing Knee Exercises standing hip flexion 2x5 each side     Knee/Hip Exercises: Sidelying   Clams 20x     Ankle Exercises: Seated   Other Seated Ankle Exercises inversion, eversion, dorsiflexion, plantarflexion - yellow band - 10x each                PT Education - 04/09/16 1707    Education provided Yes   Education Details clam and hip standing exercises   Person(s) Educated Patient   Methods Explanation;Demonstration;Verbal cues;Handout   Comprehension Verbalized understanding;Returned demonstration          PT Short Term Goals - 04/06/16 1828      PT SHORT TERM GOAL #1   Title independent with initial  HEP   Time 4   Period Weeks   Status New     PT SHORT TERM GOAL #2   Title able to walk with LRAD around grocery store   Time 4   Period Weeks   Status New     PT SHORT TERM GOAL #3   Title able to walk up and down stairs in her home with supervision and one UE support   Time 4   Period Weeks   Status New           PT Long Term Goals - 04/06/16 1831      PT LONG TERM GOAL #1   Title able to stand long enough in order to prepare and cook a meal without stopping due to fatigue or pain   Time 12   Period Weeks   Status New     PT LONG TERM GOAL #2   Title FOTO < or= to 54% limitation   Baseline 74%   Time 12   Period Weeks   Status New     PT LONG TERM GOAL #3   Title Pt demonstrates MMT 5/5 Lt ankle in all directions for  improved stability during gait.   Time 12   Period Weeks   Status New     PT LONG TERM GOAL #4   Title Pt will demonstrate TUG without AD in < 12 sec to demonstrate reduced risk of falls   Time 12   Period Weeks   Status New     PT LONG TERM GOAL #5   Title Pt will demonstrate gait WFL without CAM boot after patient's doctor discharges CAM boot with weight bearing activities.   Time 12   Period Weeks   Status New               Plan - 04/09/16 1718    Clinical Impression Statement No progress towards goals yet due to first treatment since eval.  Pt demonstrates improved gait with RW and able to demonstrate safe negotiation of stairs.  Pt will benefit from skilled PT for increased LE stregth to return to functional activities at home.   Rehab Potential Good   Clinical Impairments Affecting Rehab Potential osteopenia, fall precaution, WBAT in CAM boot, s/p ORIF    PT Frequency 2x / week   PT Duration 12 weeks   PT Treatment/Interventions ADLs/Self Care Home Management;Cryotherapy;Electrical Stimulation;Moist Heat;Neuromuscular re-education;Balance training;Therapeutic exercise;Therapeutic activities;Stair training;Gait training;Patient/family education;Manual techniques;Scar mobilization;Passive range of motion;Dry needling;Taping;Vasopneumatic Device   PT Next Visit Plan f/u with HEP and discuss reduction to 1x/week if patient is able to do a lot on her own without difficulty, progress strengthening as tolerated   PT Home Exercise Plan progress as needed   Consulted and Agree with Plan of Care Patient      Patient will benefit from skilled therapeutic intervention in order to improve the following deficits and impairments:  Abnormal gait, Decreased balance, Decreased coordination, Decreased activity tolerance, Decreased endurance, Decreased range of motion, Decreased scar mobility, Decreased strength, Difficulty walking, Increased fascial restricitons, Postural dysfunction, Pain,  Increased muscle spasms, Increased edema  Visit Diagnosis: Muscle weakness (generalized)  Localized edema     Problem List Patient Active Problem List   Diagnosis Date Noted  . Pruritus/urticaria 02/17/2016  . History of food allergy 02/17/2016  . S/P ORIF (open reduction internal fixation) fracture 02/12/2016  . Displaced trimalleolar fracture of left lower leg, initial encounter for closed fracture 02/10/2016  . Heterozygous MTHFR mutation  C677T ([place]) 01/21/2016  . Vitamin D deficiency 08/15/2014  . Osteopenia 08/15/2014  . OSA (obstructive sleep apnea) 08/07/2014  . Essential hypertension 06/27/2014  . GERD (gastroesophageal reflux disease) 06/27/2014  . Hypothyroidism 06/27/2014  . Depression 06/27/2014  . Allergic rhinitis 06/27/2014    Zannie Cove, PT 04/09/2016, 5:25 PM  [facility] [address] [place], [place], [address] Phone: [phone]   Fax:  [phone]  Name: Mary Reyes MRN: [id number] Date of Birth: 1960-06-06

## 2016-04-09 NOTE — Patient Instructions (Signed)
Abduction: Clam (Eccentric) - Side-Lying    Lie on side with knees bent. Lift top knee, keeping feet together. Keep trunk steady. Slowly lower for 3-5 seconds. _20__ reps per set, _1__ sets per day http://ecce.exer.us/65   Copyright  VHI. All rights reserved.   ABDUCTION: Standing - Resistance Band (Active)   Stand, feet flat. Against yellow resistance band, lift right leg out to side. Complete ___ sets of ___ repetitions. Perform ___ sessions per day.  ADDUCTION: Standing - Stable: Resistance Band (Active)   Stand, right leg out to side as far as possible. Against yellow resistance band, draw leg in across midline. Complete ___ sets of ___ repetitions. Perform ___ sessions per day.  Strengthening: Hip Flexion - Resisted   With tubing around left ankle, anchor behind, bring leg forward, keeping knee straight. Repeat ____ times per set. Do ____ sets per session. Do ____ sessions per day.  Strengthening: Hip Extension - Resisted   With tubing around right ankle, face anchor and pull leg straight back. Repeat ____ times per set. Do ____ sets per session. Do ____ sessions per day.   Step ups on the stairs 20x on the Left leg

## 2016-04-13 ENCOUNTER — Encounter: Payer: Managed Care, Other (non HMO) | Admitting: Physical Therapy

## 2016-04-16 ENCOUNTER — Encounter: Payer: Managed Care, Other (non HMO) | Admitting: Physical Therapy

## 2016-04-20 ENCOUNTER — Encounter: Payer: Self-pay | Admitting: Physical Therapy

## 2016-04-20 ENCOUNTER — Ambulatory Visit: Payer: Managed Care, Other (non HMO) | Admitting: Physical Therapy

## 2016-04-20 DIAGNOSIS — M6281 Muscle weakness (generalized): Secondary | ICD-10-CM | POA: Diagnosis not present

## 2016-04-20 DIAGNOSIS — R262 Difficulty in walking, not elsewhere classified: Secondary | ICD-10-CM

## 2016-04-20 DIAGNOSIS — R600 Localized edema: Secondary | ICD-10-CM

## 2016-04-20 NOTE — Therapy (Signed)
[facility] [address] [place], [place], [address] Phone: [phone]   Fax:  [phone]  Physical Therapy Treatment  Patient Details  Name: Mary Reyes MRN: [id number] Date of Birth: 08-24-1960 Referring Provider: Leandrew Koyanagi, MD  Encounter Date: 04/20/2016      PT End of Session - 04/20/16 1221    Visit Number 3   Date for PT Re-Evaluation 06/29/16   PT Start Time 1225   PT Stop Time 1316   PT Time Calculation (min) 51 min   Activity Tolerance Patient tolerated treatment well   Behavior During Therapy [facility] for tasks assessed/performed      Past Medical History:  Diagnosis Date  . Allergy   . Anxiety   . Arthritis   . Blood in stool   . Colon polyp   . Depression   . Frequent headaches   . GERD (gastroesophageal reflux disease)   . Hypertension   . Hypothyroidism   . Migraine   . OSA (obstructive sleep apnea)    CPAP nightly  . Thyroid disease   . Trimalleolar fracture of ankle, closed, left, sequela     Past Surgical History:  Procedure Laterality Date  . ABDOMINAL HYSTERECTOMY  2004  . ORIF ANKLE FRACTURE Left 02/12/2016   Procedure: OPEN REDUCTION INTERNAL FIXATION (ORIF) LEFT ANKLE FRACTURE;  Surgeon: Leandrew Koyanagi, MD;  Location: [place];  Service: Orthopedics;  Laterality: Left;  . WISDOM TOOTH EXTRACTION      There were no vitals filed for this visit.      Subjective Assessment - 04/20/16 1227    Subjective States she did too much after her previous treatment and her muscles have been spasming ever since.  States her ankle pain has been worse   Pertinent History osteopenia, fall precaution, WBAT in CAM boot, s/p ORIF    Limitations Standing;Walking   How long can you stand comfortably? 3 minutes   Currently in Pain? Yes   Pain Score 3    Pain Location Ankle   Pain Orientation Left;Medial   Pain Descriptors / Indicators Sore   Pain Type Surgical pain   Pain  Onset More than a month ago   Pain Frequency Intermittent   Aggravating Factors  standing   Pain Relieving Factors rest   Effect of Pain on Daily Activities can't do normal activities   Multiple Pain Sites No            [facility] PT Assessment - 04/20/16 0001      Figure 8 Edema   Figure 8 - Left  54.5 cm                     [facility] Adult PT Treatment/Exercise - 04/20/16 0001      Knee/Hip Exercises: Aerobic   Nustep L1 x 6 min     Knee/Hip Exercises: Seated   Long Arc Quad Strengthening;Left;2 sets;10 reps;Weights  3 lb     Knee/Hip Exercises: Supine   Short Arc Quad Sets Strengthening;Left;20 reps  3 lb   Bridges Limitations 20x   Straight Leg Raises Strengthening;Left;2 sets;10 reps     Knee/Hip Exercises: Sidelying   Clams 20x - red band     Manual Therapy   Manual Therapy Soft tissue mobilization   Soft tissue mobilization plantar muscles, edema management, scar tissue massage  educated in self massage for scar tissue     Ankle Exercises: Stretches   Soleus Stretch 3  reps;20 seconds   Gastroc Stretch 3 reps;20 seconds     Ankle Exercises: Seated   Other Seated Ankle Exercises inversion, eversion, dorsiflexion, plantarflexion - red band - 20x each                  PT Short Term Goals - 04/20/16 1331      PT SHORT TERM GOAL #1   Title independent with initial HEP   Time 4   Period Weeks   Status On-going     PT SHORT TERM GOAL #2   Title able to walk with LRAD around grocery store   Time 4   Period Weeks   Status On-going     PT SHORT TERM GOAL #3   Title able to walk up and down stairs in her home with supervision and one UE support   Time 4   Period Weeks   Status On-going           PT Long Term Goals - 04/06/16 1831      PT LONG TERM GOAL #1   Title able to stand long enough in order to prepare and cook a meal without stopping due to fatigue or pain   Time 12   Period Weeks   Status New     PT LONG TERM GOAL #2    Title FOTO < or= to 54% limitation   Baseline 74%   Time 12   Period Weeks   Status New     PT LONG TERM GOAL #3   Title Pt demonstrates MMT 5/5 Lt ankle in all directions for improved stability during gait.   Time 12   Period Weeks   Status New     PT LONG TERM GOAL #4   Title Pt will demonstrate TUG without AD in < 12 sec to demonstrate reduced risk of falls   Time 12   Period Weeks   Status New     PT LONG TERM GOAL #5   Title Pt will demonstrate gait WFL without CAM boot after patient's doctor discharges CAM boot with weight bearing activities.   Time 12   Period Weeks   Status New               Plan - 04/20/16 1323    Clinical Impression Statement Pt had set back after going back to walking. She did too much the first day and has been having increased pain ever since.  Pt did well with exercises on mat and able to perform ankle exercises with the band needing some verbal cues to perform correctly.  Pt continues to have some swelling in Lt ankle 54.5cm, but has improved from 55cm originally.  Pt able to understand how to perform scar massage at home.   Rehab Potential Good   Clinical Impairments Affecting Rehab Potential osteopenia, fall precaution, WBAT in CAM boot, s/p ORIF    PT Frequency 2x / week   PT Duration 12 weeks   PT Treatment/Interventions ADLs/Self Care Home Management;Cryotherapy;Electrical Stimulation;Moist Heat;Neuromuscular re-education;Balance training;Therapeutic exercise;Therapeutic activities;Stair training;Gait training;Patient/family education;Manual techniques;Scar mobilization;Passive range of motion;Dry needling;Taping;Vasopneumatic Device   PT Next Visit Plan Add straight leg raise, bridge, side leg raise to HEP, progress LE as tolerated, review scar massage   PT Home Exercise Plan progress as needed   Consulted and Agree with Plan of Care Patient      Patient will benefit from skilled therapeutic intervention in order to improve the  following deficits and impairments:  Abnormal  gait, Decreased balance, Decreased coordination, Decreased activity tolerance, Decreased endurance, Decreased range of motion, Decreased scar mobility, Decreased strength, Difficulty walking, Increased fascial restricitons, Postural dysfunction, Pain, Increased muscle spasms, Increased edema  Visit Diagnosis: Muscle weakness (generalized)  Localized edema  Difficulty in walking, not elsewhere classified     Problem List Patient Active Problem List   Diagnosis Date Noted  . Pruritus/urticaria 02/17/2016  . History of food allergy 02/17/2016  . S/P ORIF (open reduction internal fixation) fracture 02/12/2016  . Displaced trimalleolar fracture of left lower leg, initial encounter for closed fracture 02/10/2016  . Heterozygous MTHFR mutation C677T ([place]) 01/21/2016  . Vitamin D deficiency 08/15/2014  . Osteopenia 08/15/2014  . OSA (obstructive sleep apnea) 08/07/2014  . Essential hypertension 06/27/2014  . GERD (gastroesophageal reflux disease) 06/27/2014  . Hypothyroidism 06/27/2014  . Depression 06/27/2014  . Allergic rhinitis 06/27/2014    Zannie Cove, PT 04/20/2016, 1:31 PM  [facility] [address] [place], [place], [address] Phone: [phone]   Fax:  [phone]  Name: Eyva Heron MRN: [id number] Date of Birth: [birth year]

## 2016-04-23 ENCOUNTER — Encounter: Payer: Self-pay | Admitting: Physical Therapy

## 2016-04-23 ENCOUNTER — Ambulatory Visit: Payer: Managed Care, Other (non HMO) | Admitting: Physical Therapy

## 2016-04-23 DIAGNOSIS — M6281 Muscle weakness (generalized): Secondary | ICD-10-CM

## 2016-04-23 DIAGNOSIS — R600 Localized edema: Secondary | ICD-10-CM

## 2016-04-23 DIAGNOSIS — R262 Difficulty in walking, not elsewhere classified: Secondary | ICD-10-CM

## 2016-04-23 NOTE — Therapy (Signed)
[facility] [address] [place], [place], [address] Phone: [phone]   Fax:  [phone]  Physical Therapy Treatment  Patient Details  Name: Mary Reyes MRN: [id number] Date of Birth: 1961-02-26 Referring Provider: Leandrew Koyanagi, MD  Encounter Date: 04/23/2016      PT End of Session - 04/23/16 1634    Visit Number 4   Date for PT Re-Evaluation 06/29/16   PT Start Time 1400   PT Stop Time 1444   PT Time Calculation (min) 44 min   Activity Tolerance Patient tolerated treatment well   Behavior During Therapy [facility] for tasks assessed/performed      Past Medical History:  Diagnosis Date  . Allergy   . Anxiety   . Arthritis   . Blood in stool   . Colon polyp   . Depression   . Frequent headaches   . GERD (gastroesophageal reflux disease)   . Hypertension   . Hypothyroidism   . Migraine   . OSA (obstructive sleep apnea)    CPAP nightly  . Thyroid disease   . Trimalleolar fracture of ankle, closed, left, sequela     Past Surgical History:  Procedure Laterality Date  . ABDOMINAL HYSTERECTOMY  2004  . ORIF ANKLE FRACTURE Left 02/12/2016   Procedure: OPEN REDUCTION INTERNAL FIXATION (ORIF) LEFT ANKLE FRACTURE;  Surgeon: Leandrew Koyanagi, MD;  Location: [place];  Service: Orthopedics;  Laterality: Left;  . WISDOM TOOTH EXTRACTION      There were no vitals filed for this visit.      Subjective Assessment - 04/23/16 1408    Subjective states her ankle is feeling better but her muscles have been sore and cramping.   Pertinent History osteopenia, fall precaution, WBAT in CAM boot, s/p ORIF    Limitations Standing;Walking   How long can you stand comfortably? 3 minutes   How long can you walk comfortably? doctor not allowed to walk until seeing PT, was using knee walker   Patient Stated Goals be able to go grocery shopping, cleaning, and do all normal activities   Currently in Pain? Yes   Pain  Score 3    Pain Location Ankle   Pain Orientation Left;Lateral   Pain Descriptors / Indicators Sore   Pain Type Surgical pain   Pain Onset More than a month ago   Pain Frequency Intermittent   Aggravating Factors  the boot hitting the incision   Multiple Pain Sites No                         [facility] Adult PT Treatment/Exercise - 04/23/16 0001      Knee/Hip Exercises: Aerobic   Recumbent Bike L1 x 6 min for ROM     Knee/Hip Exercises: Seated   Long Arc Quad Strengthening;Left;2 sets;10 reps;Weights  CAM boot     Knee/Hip Exercises: Supine   Bridges Limitations 20x   Bridges with [employer] Strengthening;Both;10 reps  3 sec hold   Bridges with [employer] Strengthening;10 reps   Straight Leg Raises Strengthening;Left;2 sets;10 reps  wearing CAM boot   Other Supine Knee/Hip Exercises hip abduction      Knee/Hip Exercises: Sidelying   Clams 30x - red band     Ankle Exercises: Seated   Ankle Circles/Pumps AAROM  on rocker board   Towel Inversion/Eversion --  2 x 15 sec   Other Seated Ankle Exercises rolling on spikey ball and reviewed scar  massage                  PT Short Term Goals - 04/23/16 1414      PT SHORT TERM GOAL #1   Title independent with initial HEP   Time 4   Period Weeks   Status Achieved           PT Long Term Goals - 04/06/16 1831      PT LONG TERM GOAL #1   Title able to stand long enough in order to prepare and cook a meal without stopping due to fatigue or pain   Time 12   Period Weeks   Status New     PT LONG TERM GOAL #2   Title FOTO < or= to 54% limitation   Baseline 74%   Time 12   Period Weeks   Status New     PT LONG TERM GOAL #3   Title Pt demonstrates MMT 5/5 Lt ankle in all directions for improved stability during gait.   Time 12   Period Weeks   Status New     PT LONG TERM GOAL #4   Title Pt will demonstrate TUG without AD in < 12 sec to demonstrate reduced risk of falls   Time 12   Period  Weeks   Status New     PT LONG TERM GOAL #5   Title Pt will demonstrate gait WFL without CAM boot after patient's doctor discharges CAM boot with weight bearing activities.   Time 12   Period Weeks   Status New               Plan - 04/23/16 1636    Clinical Impression Statement Pt continue to have difficulty with inversion.  She is progressing with LE and has been able to successfully perform exericses at home.  Skilled PT needed for ankle ROM and LE strength for return to fucntional activities.   Rehab Potential Good   Clinical Impairments Affecting Rehab Potential osteopenia, fall precaution, WBAT in CAM boot, s/p ORIF    PT Frequency 2x / week   PT Duration 12 weeks   PT Treatment/Interventions ADLs/Self Care Home Management;Cryotherapy;Electrical Stimulation;Moist Heat;Neuromuscular re-education;Balance training;Therapeutic exercise;Therapeutic activities;Stair training;Gait training;Patient/family education;Manual techniques;Scar mobilization;Passive range of motion;Dry needling;Taping;Vasopneumatic Device   PT Next Visit Plan LE strength and ankle ROM progress as tolerated   PT Home Exercise Plan progress as needed   Consulted and Agree with Plan of Care Patient      Patient will benefit from skilled therapeutic intervention in order to improve the following deficits and impairments:  Abnormal gait, Decreased balance, Decreased coordination, Decreased activity tolerance, Decreased endurance, Decreased range of motion, Decreased scar mobility, Decreased strength, Difficulty walking, Increased fascial restricitons, Postural dysfunction, Pain, Increased muscle spasms, Increased edema  Visit Diagnosis: Difficulty in walking, not elsewhere classified  Muscle weakness (generalized)  Localized edema     Problem List Patient Active Problem List   Diagnosis Date Noted  . Pruritus/urticaria 02/17/2016  . History of food allergy 02/17/2016  . S/P ORIF (open reduction  internal fixation) fracture 02/12/2016  . Displaced trimalleolar fracture of left lower leg, initial encounter for closed fracture 02/10/2016  . Heterozygous MTHFR mutation C677T ([place]) 01/21/2016  . Vitamin D deficiency 08/15/2014  . Osteopenia 08/15/2014  . OSA (obstructive sleep apnea) 08/07/2014  . Essential hypertension 06/27/2014  . GERD (gastroesophageal reflux disease) 06/27/2014  . Hypothyroidism 06/27/2014  . Depression 06/27/2014  . Allergic rhinitis 06/27/2014  Zannie Cove, PT 04/23/2016, 4:41 PM  [facility] [address] [place], [place], [address] Phone: [phone]   Fax:  [phone]  Name: Mary Reyes MRN: [id number] Date of Birth: 08-12-1960

## 2016-04-27 ENCOUNTER — Encounter: Payer: Self-pay | Admitting: Physical Therapy

## 2016-04-27 ENCOUNTER — Ambulatory Visit: Payer: Managed Care, Other (non HMO) | Admitting: Physical Therapy

## 2016-04-27 DIAGNOSIS — M6281 Muscle weakness (generalized): Secondary | ICD-10-CM | POA: Diagnosis not present

## 2016-04-27 DIAGNOSIS — R600 Localized edema: Secondary | ICD-10-CM

## 2016-04-27 DIAGNOSIS — R262 Difficulty in walking, not elsewhere classified: Secondary | ICD-10-CM

## 2016-04-27 NOTE — Therapy (Addendum)
[facility] [address] [place], [place], [address] Phone: [phone]   Fax:  [phone]  Physical Therapy Treatment  Patient Details  Name: Mary Reyes MRN: [id number] Date of Birth: [birth year] Referring Provider: Leandrew Koyanagi, MD  Encounter Date: 04/27/2016      PT End of Session - 04/27/16 1524    Visit Number 5   Date for PT Re-Evaluation 06/29/16   PT Start Time [id number]   PT Stop Time 1524   PT Time Calculation (min) 39 min   Activity Tolerance Patient tolerated treatment well   Behavior During Therapy [facility] for tasks assessed/performed      Past Medical History:  Diagnosis Date  . Allergy   . Anxiety   . Arthritis   . Blood in stool   . Colon polyp   . Depression   . Frequent headaches   . GERD (gastroesophageal reflux disease)   . Hypertension   . Hypothyroidism   . Migraine   . OSA (obstructive sleep apnea)    CPAP nightly  . Thyroid disease   . Trimalleolar fracture of ankle, closed, left, sequela     Past Surgical History:  Procedure Laterality Date  . ABDOMINAL HYSTERECTOMY  2004  . ORIF ANKLE FRACTURE Left 02/12/2016   Procedure: OPEN REDUCTION INTERNAL FIXATION (ORIF) LEFT ANKLE FRACTURE;  Surgeon: Leandrew Koyanagi, MD;  Location: [place];  Service: Orthopedics;  Laterality: Left;  . WISDOM TOOTH EXTRACTION      There were no vitals filed for this visit.      Subjective Assessment - 04/27/16 1445    Subjective States she is okay today.  States the cramping has been a little better.   Pertinent History osteopenia, fall precaution, WBAT in CAM boot, s/p ORIF    Limitations Standing;Walking   How long can you stand comfortably? 3 minutes   Patient Stated Goals be able to go grocery shopping, cleaning, and do all normal activities   Currently in Pain? Yes   Pain Score 2    Pain Location Ankle   Pain Orientation Left;Lateral   Pain Descriptors / Indicators Sore   Pain  Type Surgical pain   Pain Onset More than a month ago   Pain Frequency Intermittent   Aggravating Factors  moving the ankle   Multiple Pain Sites No                         [facility] Adult PT Treatment/Exercise - 04/27/16 0001      Neuro Re-ed    Neuro Re-ed Details  standing on foam mat weight shifts and marching     Knee/Hip Exercises: Aerobic   Nustep L1 x 6 min     Knee/Hip Exercises: Seated   Long Arc Quad Strengthening;Left;2 sets;10 reps;Weights  4 lb     Manual Therapy   Manual Therapy Soft tissue mobilization   Soft tissue mobilization scar tissue massag, edema management,  educated in self massage for scar tissue     Ankle Exercises: Seated   Ankle Circles/Pumps AROM;20 reps  on rocker board   Toe Raise 20 reps     Ankle Exercises: Supine   T-Band yellow - inversion, eversion, dorsiflexion - 20x                  PT Short Term Goals - 04/27/16 1448      PT SHORT TERM GOAL #2   Title able  to walk with LRAD around grocery store   Time 4   Period Weeks   Status On-going     PT SHORT TERM GOAL #3   Title able to walk up and down stairs in her home with supervision and one UE support   Time 4   Period Weeks   Status Achieved           PT Long Term Goals - 04/27/16 1449      PT LONG TERM GOAL #1   Title able to stand long enough in order to prepare and cook a meal without stopping due to fatigue or pain   Baseline needs help and needs to do it in pieces   Time 12   Period Weeks   Status On-going     PT LONG TERM GOAL #2   Title FOTO < or= to 54% limitation   Baseline 74%   Time 12   Period Weeks   Status On-going     PT LONG TERM GOAL #3   Title Pt demonstrates MMT 5/5 Lt ankle in all directions for improved stability during gait.   Time 12   Period Weeks   Status On-going               Plan - 04/27/16 1526    Clinical Impression Statement Pt able to tolerate more reps of inversion.  Able to progress to  weight shifting on foam mat.  Continues to have weakness and decrease ROM.  Skilled PT needed for improved function.   Rehab Potential Good   Clinical Impairments Affecting Rehab Potential osteopenia, fall precaution, WBAT in CAM boot, s/p ORIF    PT Frequency 2x / week   PT Duration 12 weeks   PT Treatment/Interventions ADLs/Self Care Home Management;Cryotherapy;Electrical Stimulation;Moist Heat;Neuromuscular re-education;Balance training;Therapeutic exercise;Therapeutic activities;Stair training;Gait training;Patient/family education;Manual techniques;Scar mobilization;Passive range of motion;Dry needling;Taping;Vasopneumatic Device   PT Next Visit Plan LE strength and ankle ROM, increased weight bearing progress as tolerated   PT Home Exercise Plan progress as needed   Consulted and Agree with Plan of Care Patient      Patient will benefit from skilled therapeutic intervention in order to improve the following deficits and impairments:  Abnormal gait, Decreased balance, Decreased coordination, Decreased activity tolerance, Decreased endurance, Decreased range of motion, Decreased scar mobility, Decreased strength, Difficulty walking, Increased fascial restricitons, Postural dysfunction, Pain, Increased muscle spasms, Increased edema  Visit Diagnosis: Difficulty in walking, not elsewhere classified  Muscle weakness (generalized)  Localized edema     Problem List Patient Active Problem List   Diagnosis Date Noted  . Pruritus/urticaria 02/17/2016  . History of food allergy 02/17/2016  . S/P ORIF (open reduction internal fixation) fracture 02/12/2016  . Displaced trimalleolar fracture of left lower leg, initial encounter for closed fracture 02/10/2016  . Heterozygous MTHFR mutation C677T ([place]) 01/21/2016  . Vitamin D deficiency 08/15/2014  . Osteopenia 08/15/2014  . OSA (obstructive sleep apnea) 08/07/2014  . Essential hypertension 06/27/2014  . GERD (gastroesophageal reflux  disease) 06/27/2014  . Hypothyroidism 06/27/2014  . Depression 06/27/2014  . Allergic rhinitis 06/27/2014    Zannie Cove, PT 04/27/2016, 5:58 PM  [facility] [address], [place], [place], [address] Phone: [phone]   Fax:  [phone]  Name: Mary Reyes MRN: [id number] Date of Birth: [birth year]

## 2016-05-06 ENCOUNTER — Encounter: Payer: Self-pay | Admitting: Physical Therapy

## 2016-05-06 ENCOUNTER — Ambulatory Visit: Payer: Managed Care, Other (non HMO) | Attending: Orthopaedic Surgery | Admitting: Physical Therapy

## 2016-05-06 DIAGNOSIS — R600 Localized edema: Secondary | ICD-10-CM | POA: Diagnosis present

## 2016-05-06 DIAGNOSIS — R262 Difficulty in walking, not elsewhere classified: Secondary | ICD-10-CM | POA: Diagnosis not present

## 2016-05-06 DIAGNOSIS — M6281 Muscle weakness (generalized): Secondary | ICD-10-CM | POA: Insufficient documentation

## 2016-05-06 NOTE — Therapy (Addendum)
[facility] [address] [place], [place], [address] Phone: [phone]   Fax:  [phone]  Physical Therapy Treatment  Patient Details  Name: Mary Reyes MRN: [id number] Date of Birth: October 29, 1960 Referring Provider: Leandrew Koyanagi, MD  Encounter Date: 05/06/2016      PT End of Session - 05/06/16 1409    Visit Number 6   Date for PT Re-Evaluation 06/29/16   PT Start Time 1404   PT Stop Time 1452   PT Time Calculation (min) 48 min   Activity Tolerance Patient tolerated treatment well   Behavior During Therapy [facility] for tasks assessed/performed      Past Medical History:  Diagnosis Date  . Allergy   . Anxiety   . Arthritis   . Blood in stool   . Colon polyp   . Depression   . Frequent headaches   . GERD (gastroesophageal reflux disease)   . Hypertension   . Hypothyroidism   . Migraine   . OSA (obstructive sleep apnea)    CPAP nightly  . Thyroid disease   . Trimalleolar fracture of ankle, closed, left, sequela     Past Surgical History:  Procedure Laterality Date  . ABDOMINAL HYSTERECTOMY  2004  . ORIF ANKLE FRACTURE Left 02/12/2016   Procedure: OPEN REDUCTION INTERNAL FIXATION (ORIF) LEFT ANKLE FRACTURE;  Surgeon: Leandrew Koyanagi, MD;  Location: [place];  Service: Orthopedics;  Laterality: Left;  . WISDOM TOOTH EXTRACTION      There were no vitals filed for this visit.      Subjective Assessment - 05/06/16 1407    Subjective Pt reports ankle hurting a little today, so have knees. Top of foot at ankle it a little swollen. Patient will see MD on Monday and is hoping that he will allow patient to come out of CAM boot.   Pertinent History osteopenia, fall precaution, WBAT in CAM boot, s/p ORIF    Limitations Standing;Walking   How long can you stand comfortably? 3 minutes   How long can you walk comfortably? doctor not allowed to walk until seeing PT, was using knee walker   Patient  Stated Goals be able to go grocery shopping, cleaning, and do all normal activities   Currently in Pain? Yes   Pain Score 2    Pain Location Ankle   Pain Orientation Left;Lateral   Pain Descriptors / Indicators Sore   Pain Type Surgical pain   Pain Radiating Towards down both legs- cramping   Pain Onset More than a month ago   Pain Frequency Intermittent   Aggravating Factors  moveing the ankle   Pain Relieving Factors rest   Effect of Pain on Daily Activities can't do normal activities.    Multiple Pain Sites No                         [facility] Adult PT Treatment/Exercise - 05/06/16 0001      Neuro Re-ed    Neuro Re-ed Details  standing on foam mat weight shifts and marching     Knee/Hip Exercises: Aerobic   Nustep L1 x 6 min  Therapist present to discuss treatment     Knee/Hip Exercises: Seated   Long Arc Quad Strengthening;Left;2 sets;10 reps;Weights  4 lb   Hamstring Curl Strengthening;Both;2 sets;10 reps  Green Tband     Knee/Hip Exercises: Supine   Straight Leg Raises Strengthening;Left;2 sets;10 reps  wearing CAM boot  Knee/Hip Exercises: Sidelying   Clams 30x - red band     Modalities   Modalities Vasopneumatic     Vasopneumatic   Number Minutes Vasopneumatic  15 minutes   Vasopnuematic Location  Ankle   Vasopneumatic Pressure Medium   Vasopneumatic Temperature  3 snow flakes     Ankle Exercises: Seated   ABC's 1 rep   Ankle Circles/Pumps AROM;20 reps  on rocker board   Towel Inversion/Eversion 5 reps     Ankle Exercises: Supine   T-Band yellow - inversion, eversion, dorsiflexion - 20x                  PT Short Term Goals - 05/06/16 1409      PT SHORT TERM GOAL #2   Title able to walk with LRAD around grocery store   Time 4   Period Weeks   Status On-going           PT Long Term Goals - 05/06/16 1409      PT LONG TERM GOAL #1   Title able to stand long enough in order to prepare and cook a meal without  stopping due to fatigue or pain   Time 12   Period Weeks   Status On-going     PT LONG TERM GOAL #2   Title FOTO < or= to 54% limitation   Time 12   Period Weeks   Status On-going     PT LONG TERM GOAL #3   Title Pt demonstrates MMT 5/5 Lt ankle in all directions for improved stability during gait.   Time 12   Period Weeks   Status On-going               Plan - 05/06/16 1450    Clinical Impression Statement Pt presents with CAM boot and antalgic gait. Having some pain and increased swelling today. Pt able to tolerate all exercises well with minimal discomfort. Pt will see MD on Monday too see if she can come out of CAM boot. Pt will continue to benefit from skilled therapy for ankle and LE strength and stability.    Rehab Potential Good   Clinical Impairments Affecting Rehab Potential osteopenia, fall precaution, WBAT in CAM boot, s/p ORIF    PT Frequency 2x / week   PT Duration 12 weeks   PT Treatment/Interventions ADLs/Self Care Home Management;Cryotherapy;Electrical Stimulation;Moist Heat;Neuromuscular re-education;Balance training;Therapeutic exercise;Therapeutic activities;Stair training;Gait training;Patient/family education;Manual techniques;Scar mobilization;Passive range of motion;Dry needling;Taping;Vasopneumatic Device   PT Next Visit Plan LE strength and ankle ROM, increased weight bearing progress as tolerated   Consulted and Agree with Plan of Care Patient      Patient will benefit from skilled therapeutic intervention in order to improve the following deficits and impairments:  Abnormal gait, Decreased balance, Decreased coordination, Decreased activity tolerance, Decreased endurance, Decreased range of motion, Decreased scar mobility, Decreased strength, Difficulty walking, Increased fascial restricitons, Postural dysfunction, Pain, Increased muscle spasms, Increased edema  Visit Diagnosis: Difficulty in walking, not elsewhere classified  Muscle weakness  (generalized)  Localized edema     Problem List Patient Active Problem List   Diagnosis Date Noted  . Pruritus/urticaria 02/17/2016  . History of food allergy 02/17/2016  . S/P ORIF (open reduction internal fixation) fracture 02/12/2016  . Displaced trimalleolar fracture of left lower leg, initial encounter for closed fracture 02/10/2016  . Heterozygous MTHFR mutation C677T ([place]) 01/21/2016  . Vitamin D deficiency 08/15/2014  . Osteopenia 08/15/2014  . OSA (obstructive sleep apnea) 08/07/2014  .  Essential hypertension 06/27/2014  . GERD (gastroesophageal reflux disease) 06/27/2014  . Hypothyroidism 06/27/2014  . Depression 06/27/2014  . Allergic rhinitis 06/27/2014    Mikle Bosworth PTA 05/06/2016, 3:00 PM  [facility] [address] [place], [place], [address] Phone: [phone]   Fax:  [phone]  Name: Mary Reyes MRN: [id number] Date of Birth: 03-13-1961  PHYSICAL THERAPY DISCHARGE SUMMARY  Visits from Start of Care: 6  Current functional level related to goals / functional outcomes: Pt did not meet all goals but felt better and called clinic reporting doctor said she could be discharged from PT   Remaining deficits: See above   Education / Equipment: HEP  Plan: Patient agrees to discharge.  Patient goals were not met. Patient is being discharged due to being pleased with the current functional level.  ?????       [employer], PT 05/21/16 8:55 AM

## 2016-05-10 ENCOUNTER — Other Ambulatory Visit: Payer: Self-pay | Admitting: Internal Medicine

## 2016-05-11 ENCOUNTER — Ambulatory Visit (INDEPENDENT_AMBULATORY_CARE_PROVIDER_SITE_OTHER): Payer: Self-pay | Admitting: Orthopaedic Surgery

## 2016-05-11 ENCOUNTER — Ambulatory Visit (INDEPENDENT_AMBULATORY_CARE_PROVIDER_SITE_OTHER): Payer: Managed Care, Other (non HMO)

## 2016-05-11 DIAGNOSIS — S82852D Displaced trimalleolar fracture of left lower leg, subsequent encounter for closed fracture with routine healing: Secondary | ICD-10-CM

## 2016-05-11 DIAGNOSIS — S82852A Displaced trimalleolar fracture of left lower leg, initial encounter for closed fracture: Secondary | ICD-10-CM

## 2016-05-11 NOTE — Progress Notes (Signed)
Patient is 3 months status post ORIF left trimalleolar ankle fracture. She is doing well. She has advanced with physical therapy quite well. Physical exam is benign. Minimal swelling. X-ray show stable fixation and healed fractures. No complications. At this point will discontinue cam walker. Discontinue physical therapy and just do home exercises. I'll see her back as needed.

## 2016-05-12 ENCOUNTER — Encounter: Payer: Managed Care, Other (non HMO) | Admitting: Physical Therapy

## 2016-08-06 ENCOUNTER — Other Ambulatory Visit: Payer: Self-pay

## 2016-08-06 MED ORDER — LEVOTHYROXINE SODIUM 50 MCG PO TABS: ORAL_TABLET | ORAL | 0 refills | Status: DC

## 2016-08-07 ENCOUNTER — Other Ambulatory Visit: Payer: Self-pay

## 2016-08-07 MED ORDER — LEVOTHYROXINE SODIUM 50 MCG PO TABS: ORAL_TABLET | ORAL | 0 refills | Status: DC

## 2016-08-10 ENCOUNTER — Other Ambulatory Visit: Payer: Self-pay

## 2016-08-10 MED ORDER — LEVOTHYROXINE SODIUM 50 MCG PO TABS: ORAL_TABLET | ORAL | 0 refills | Status: DC

## 2016-09-05 ENCOUNTER — Encounter: Payer: Self-pay | Admitting: Internal Medicine

## 2016-09-07 ENCOUNTER — Other Ambulatory Visit: Payer: Self-pay

## 2016-09-07 DIAGNOSIS — E039 Hypothyroidism, unspecified: Secondary | ICD-10-CM

## 2016-09-08 ENCOUNTER — Other Ambulatory Visit (INDEPENDENT_AMBULATORY_CARE_PROVIDER_SITE_OTHER): Payer: Managed Care, Other (non HMO)

## 2016-09-08 DIAGNOSIS — E039 Hypothyroidism, unspecified: Secondary | ICD-10-CM

## 2016-09-08 LAB — T3, FREE: T3 FREE: 3.3 pg/mL (ref 2.3–4.2)

## 2016-09-08 LAB — T4, FREE: Free T4: 0.72 ng/dL (ref 0.60–1.60)

## 2016-09-08 LAB — TSH: TSH: 1.42 u[IU]/mL (ref 0.35–4.50)

## 2016-09-21 DIAGNOSIS — K635 Polyp of colon: Secondary | ICD-10-CM | POA: Insufficient documentation

## 2016-09-29 ENCOUNTER — Ambulatory Visit (AMBULATORY_SURGERY_CENTER): Payer: Self-pay

## 2016-09-29 VITALS — Ht 62.5 in | Wt 233.6 lb

## 2016-09-29 DIAGNOSIS — Z1211 Encounter for screening for malignant neoplasm of colon: Secondary | ICD-10-CM

## 2016-09-29 MED ORDER — SUPREP BOWEL PREP KIT 17.5-3.13-1.6 GM/177ML PO SOLN: 1.0000 | Freq: Once | ORAL | 0 refills | Status: AC

## 2016-09-29 NOTE — Progress Notes (Signed)
No allergies to eggs or soy No diet meds No home oxygen No past problems with anesthesia EXCEPT MOTHER TOLD HER THERE WAS A PROBLEM WITH THE ANESTHESIA WHEN SHE HAD HER WISDOM TEETH OUT BUT SHE HAS NO IDEA EXACTLY WHAT KIND OF ANESTHESIA.  REGISTERED EMMI

## 2016-10-13 ENCOUNTER — Encounter: Payer: Managed Care, Other (non HMO) | Admitting: Internal Medicine

## 2016-10-14 ENCOUNTER — Encounter: Payer: Self-pay | Admitting: Internal Medicine

## 2016-10-21 ENCOUNTER — Ambulatory Visit (AMBULATORY_SURGERY_CENTER): Payer: Managed Care, Other (non HMO) | Admitting: Internal Medicine

## 2016-10-21 ENCOUNTER — Encounter: Payer: Self-pay | Admitting: Internal Medicine

## 2016-10-21 VITALS — BP 140/71 | HR 65 | Temp 97.5°F | Resp 21 | Ht 62.0 in | Wt 242.0 lb

## 2016-10-21 DIAGNOSIS — K635 Polyp of colon: Secondary | ICD-10-CM

## 2016-10-21 DIAGNOSIS — D120 Benign neoplasm of cecum: Secondary | ICD-10-CM

## 2016-10-21 DIAGNOSIS — Z1211 Encounter for screening for malignant neoplasm of colon: Secondary | ICD-10-CM | POA: Diagnosis not present

## 2016-10-21 DIAGNOSIS — Z1212 Encounter for screening for malignant neoplasm of rectum: Secondary | ICD-10-CM | POA: Diagnosis not present

## 2016-10-21 MED ORDER — SODIUM CHLORIDE 0.9 % IV SOLN: 500.0000 mL | INTRAVENOUS | Status: AC

## 2016-10-21 NOTE — Progress Notes (Signed)
Called to room to assist during endoscopic procedure.  Patient ID and intended procedure confirmed with present staff. Received instructions for my participation in the procedure from the performing physician.  

## 2016-10-21 NOTE — Patient Instructions (Signed)
YOU HAD AN ENDOSCOPIC PROCEDURE TODAY AT THE [facility]:   Refer to the procedure report that was given to you for any specific questions about what was found during the examination.  If the procedure report does not answer your questions, please call your gastroenterologist to clarify.  If you requested that your care partner not be given the details of your procedure findings, then the procedure report has been included in a sealed envelope for you to review at your convenience later.  YOU SHOULD EXPECT: Some feelings of bloating in the abdomen. Passage of more gas than usual.  Walking can help get rid of the air that was put into your GI tract during the procedure and reduce the bloating. If you had a lower endoscopy (such as a colonoscopy or flexible sigmoidoscopy) you may notice spotting of blood in your stool or on the toilet paper. If you underwent a bowel prep for your procedure, you may not have a normal bowel movement for a few days.  Please Note:  You might notice some irritation and congestion in your nose or some drainage.  This is from the oxygen used during your procedure.  There is no need for concern and it should clear up in a day or so.  SYMPTOMS TO REPORT IMMEDIATELY:   Following lower endoscopy (colonoscopy or flexible sigmoidoscopy):  Excessive amounts of blood in the stool  Significant tenderness or worsening of abdominal pains  Swelling of the abdomen that is new, acute  Fever of 100F or higher  For urgent or emergent issues, a gastroenterologist can be reached at any hour by calling [phone].   DIET:  We do recommend a small meal at first, but then you may proceed to your regular diet.  Drink plenty of fluids but you should avoid alcoholic beverages for 24 hours.  ACTIVITY:  You should plan to take it easy for the rest of today and you should NOT DRIVE or use heavy machinery until tomorrow (because of the sedation medicines used during the test).     FOLLOW UP: Our staff will call the number listed on your records the next business day following your procedure to check on you and address any questions or concerns that you may have regarding the information given to you following your procedure. If we do not reach you, we will leave a message.  However, if you are feeling well and you are not experiencing any problems, there is no need to return our call.  We will assume that you have returned to your regular daily activities without incident.  If any biopsies were taken you will be contacted by phone or by letter within the next 1-3 weeks.  Please call us at [phone] if you have not heard about the biopsies in 3 weeks.   Await for biopsy results to determine next repeat Colonoscopy screening Polyps (handout given)  SIGNATURES/CONFIDENTIALITY: You and/or your care partner have signed paperwork which will be entered into your electronic medical record.  These signatures attest to the fact that that the information above on your After Visit Summary has been reviewed and is understood.  Full responsibility of the confidentiality of this discharge information lies with you and/or your care-partner. 

## 2016-10-21 NOTE — Progress Notes (Signed)
Pt's states no medical or surgical changes since previsit or office visit. 

## 2016-10-21 NOTE — Op Note (Signed)
[facility] Patient Name: Mary Reyes Procedure Date: 10/21/2016 2:38 PM MRN: [id number] Endoscopist: Jerene Bears , MD Age: 56 Referring MD:  Date of Birth: [birth year] Gender: Female Account #: [id number] Procedure:                Colonoscopy Indications:              Screening for colorectal malignant neoplasm Medicines:                Monitored Anesthesia Care Procedure:                Pre-Anesthesia Assessment:                           - Prior to the procedure, a History and Physical                            was performed, and patient medications and                            allergies were reviewed. The patient's tolerance of                            previous anesthesia was also reviewed. The risks                            and benefits of the procedure and the sedation                            options and risks were discussed with the patient.                            All questions were answered, and informed consent                            was obtained. Prior Anticoagulants: The patient has                            taken no previous anticoagulant or antiplatelet                            agents. ASA Grade Assessment: III - A patient with                            severe systemic disease. After reviewing the risks                            and benefits, the patient was deemed in                            satisfactory condition to undergo the procedure.                           After obtaining informed consent, the colonoscope  was passed under direct vision. Throughout the                            procedure, the patient's blood pressure, pulse, and                            oxygen saturations were monitored continuously. The                            Model PCF-[id number]) scope was introduced                            through the anus and advanced to the the cecum,                            identified by  appendiceal orifice and ileocecal                            valve. The colonoscopy was performed without                            difficulty. The patient tolerated the procedure                            well. The quality of the bowel preparation was                            good. The ileocecal valve, appendiceal orifice, and                            rectum were photographed. Scope In: 2:46:02 PM Scope Out: 3:00:01 PM Scope Withdrawal Time: 0 hours 9 minutes 4 seconds  Total Procedure Duration: 0 hours 13 minutes 59 seconds  Findings:                 The digital rectal exam was normal.                           A 7 mm polyp was found in the cecum. The polyp was                            sessile. The polyp was removed with a cold snare.                            Resection and retrieval were complete.                           The exam was otherwise without abnormality on                            direct and retroflexion views. Complications:            No immediate complications. Estimated Blood Loss:     Estimated blood loss was minimal. Impression:               -  One 7 mm polyp in the cecum, removed with a cold                            snare. Resected and retrieved.                           - The examination was otherwise normal on direct                            and retroflexion views. Recommendation:           - Patient has a contact number available for                            emergencies. The signs and symptoms of potential                            delayed complications were discussed with the                            patient. Return to normal activities tomorrow.                            Written discharge instructions were provided to the                            patient.                           - Resume previous diet.                           - Continue present medications.                           - Await pathology results.                            - Repeat colonoscopy for surveillance based on                            pathology results. Jerene Bears, MD 10/21/2016 3:02:15 PM This report has been signed electronically.

## 2016-10-21 NOTE — Progress Notes (Signed)
Report to PACU, RN, vss, BBS= Clear.  

## 2016-10-22 ENCOUNTER — Telehealth: Payer: Self-pay | Admitting: *Deleted

## 2016-10-22 NOTE — Telephone Encounter (Signed)
  Follow up Call-  Call back number 10/21/2016  Post procedure Call Back phone  # [phone]  Permission to leave phone message Yes     Patient questions:  Do you have a fever, pain , or abdominal swelling? No. Pain Score  0 *  Have you tolerated food without any problems? Yes.    Have you been able to return to your normal activities? Yes.    Do you have any questions about your discharge instructions: Diet   No. Medications  No. Follow up visit  No.  Do you have questions or concerns about your Care? No.  Actions: * If pain score is 4 or above: No action needed, pain <4.

## 2016-10-26 ENCOUNTER — Encounter: Payer: Self-pay | Admitting: Internal Medicine

## 2016-11-03 ENCOUNTER — Ambulatory Visit (INDEPENDENT_AMBULATORY_CARE_PROVIDER_SITE_OTHER): Payer: Managed Care, Other (non HMO) | Admitting: Internal Medicine

## 2016-11-03 VITALS — BP 124/70 | HR 65 | Wt 232.0 lb

## 2016-11-03 DIAGNOSIS — M85862 Other specified disorders of bone density and structure, left lower leg: Secondary | ICD-10-CM

## 2016-11-03 DIAGNOSIS — E559 Vitamin D deficiency, unspecified: Secondary | ICD-10-CM

## 2016-11-03 DIAGNOSIS — E7212 Methylenetetrahydrofolate reductase deficiency: Secondary | ICD-10-CM

## 2016-11-03 DIAGNOSIS — M8580 Other specified disorders of bone density and structure, unspecified site: Secondary | ICD-10-CM

## 2016-11-03 DIAGNOSIS — E039 Hypothyroidism, unspecified: Secondary | ICD-10-CM

## 2016-11-03 DIAGNOSIS — Z1589 Genetic susceptibility to other disease: Secondary | ICD-10-CM

## 2016-11-03 LAB — VITAMIN D 25 HYDROXY (VIT D DEFICIENCY, FRACTURES): VITD: 43.8 ng/mL (ref 30.00–100.00)

## 2016-11-03 MED ORDER — LEVOTHYROXINE SODIUM 50 MCG PO TABS: ORAL_TABLET | ORAL | 3 refills | Status: DC

## 2016-11-03 MED ORDER — LIOTHYRONINE SODIUM 5 MCG PO TABS: 7.5000 ug | ORAL_TABLET | Freq: Every day | ORAL | 3 refills | Status: DC

## 2016-11-03 NOTE — Patient Instructions (Signed)
Please stop at the lab.  Please continue Levothyroxine 50 mcg on 6/7 days, and 100 mcg 1/7 days + Cytomel 7.5 mcg.  Take the thyroid hormone every day, with water, at least 30 minutes before breakfast, separated by at least 4 hours from: - acid reflux medications - calcium - iron - multivitamins  Continue vitamin D3 2000 units daily.  Please come back in 1 year.

## 2016-11-03 NOTE — Progress Notes (Signed)
Patient ID: Mary Reyes, female   DOB: 19-Dec-1960, 56 y.o.   MRN: [id number]   HPI  Mary Reyes is a 56 y.o.-year-old female, returning for f/u for hypothyroidism, vitamin D deficiency and osteopenia. Last visit 11 mo ago.  She broke her L ankle in 01/2016. She had to have Sx for this.  Hypothyroidism: Reviewed history: She tells me that her thyroid problem started when she found a "lump in her neck" in 2001. She saw ENT at that time, who was following the nodule, which kept growing. She remembers that she was then dx with Mononucleosis. She had a FNA of the nodule and this apparently returned positive for lymphoid tissue. It was considered that this was an enlarged lymph node possibly from mononucleosis. She did not have a thyroid U/S after this episode. She remembers that the nodule did decrease in size after prednisone.  Patient was found to be hyperthyroid during the investigation for her enlarged lymph node. She initially tried oral anti-thyroid medicines, which were not effective. She had RAI tx in 2002. She did not have the start LT4 after her RAI Tx, but became hypothyroid and had to start levothyroxine in 2008. She is not sure why she was recommended to add LT3, but she believes that it was added because of her history of depression.   She is on Levothyroxine 50 mcg on 6/7 days, and 100 mcg 1/7 days (LT4) + Cytomel 7.5 mcg (LT3), taken: - in am - fasting - at least 30 min from b'fast - + Ca and PPIs later in the day - no Fe, MVI - not on Biotin  I reviewed pt's thyroid tests: Lab Results  Component Value Date   TSH 1.42 09/08/2016   TSH 0.89 02/03/2016   TSH 1.20 01/16/2015   TSH 1.08 07/20/2014   FREET4 0.72 09/08/2016   FREET4 0.84 02/03/2016   FREET4 0.70 01/16/2015   FREET4 0.86 07/20/2014  04/03/2014: TSH 0.92, fT4 0.97, fT3 2.8  Pt denies: - feeling nodules in neck - hoarseness - dysphagia - choking - SOB with lying down  She has + FH of thyroid disorders in:  thyroiditis. No FH of thyroid cancer.  No seaweed or kelp. No recent contrast studies. No herbal supplements. No Biotin use. No recent steroids use.  + h/o radiation tx to head or neck. .  She also has vitamin D deficiency: - she wason Ergocalciferol 50,000 units once a month - was forgetting it >> now on 2000 IU daily + 800 units from Calcium-vitamin D  Last vit D level: Lab Results  Component Value Date   VD25OH 42.23 02/03/2016   VD25OH 45.34 07/20/2014   She also has osteopenia: - last DEXA scan 09/05/2014: L1-L4: T -1.6 RFN: T -1.3 LFN: T -1.0 FRAX MOF risk: 5.1%, Hip fx risk 0.3% - + ankle fx 01/2016 - No prev. antiresorptive tx  She has dysesthesia and I checked her for MTHFR mutation at last visit. These returned positive for:  Component     Latest Ref Rng & Units 12/11/2015  MTHFR      C677T Single mutation (C677T) identified  Vitamin B12     211 - 911 pg/mL 447  Methylmalonic Acid, Quant     87 - 318 nmol/L 111  Vitamin B12 and MMA normal.  Pt is heterozygous for the C667T MTHFR mutation >> This is associated with a ~30% decrease mutilation activity.  I called and discussed with the geneticist at [facility], recommended to check a homocystine.  If this is high, this could be treated, but otherwise, no intervention is necessary.  Previous endocrinologist Dr. Abel Presto [phone] - in [place]. She also has a history of ?MS, GERD, HTN, OSA, migraines. Father had Lieber's ds.   ROS: Constitutional: no weight gain/no weight loss, + fatigue, no subjective hyperthermia, no subjective hypothermia, + nocturia Eyes: no blurry vision, no xerophthalmia ENT: no sore throat, no nodules palpated in throat, no dysphagia, no odynophagia, no hoarseness Cardiovascular: no CP/no SOB/+ palpitations/no leg swelling Respiratory: no cough/no SOB/no wheezing Gastrointestinal: no N/no V/no D/no C/no acid reflux Musculoskeletal: no muscle aches/no joint aches Skin: + rash, + hair  loss Neurological: no tremors/no numbness/no tingling/no dizziness  I reviewed pt's medications, allergies, PMH, social hx, family hx, and changes were documented in the history of present illness. Otherwise, unchanged from my initial visit note.  Past Medical History:  Diagnosis Date  . Allergy   . Anxiety   . Arthritis   . Blood in stool   . Colon polyp   . Depression   . Eczema   . Frequent headaches   . GERD (gastroesophageal reflux disease)   . Hypertension   . Hypothyroidism   . Migraine   . OSA (obstructive sleep apnea)    CPAP nightly  . Osteopenia   . Sleep apnea   . Thyroid disease   . Trimalleolar fracture of ankle, closed, left, sequela    Past Surgical History:  Procedure Laterality Date  . ABDOMINAL HYSTERECTOMY  2004  . ORIF ANKLE FRACTURE Left 02/12/2016   Procedure: OPEN REDUCTION INTERNAL FIXATION (ORIF) LEFT ANKLE FRACTURE;  Surgeon: Leandrew Koyanagi, MD;  Location: [facility];  Service: Orthopedics;  Laterality: Left;  . WISDOM TOOTH EXTRACTION     History   Social History  . Marital Status: Married    Spouse Name: N/A  . Number of Children:  3    Occupational History  . N/a   Social History Main Topics  . Smoking status: Never Smoker   . Smokeless tobacco: Not on file  . Alcohol Use: No  . Drug Use: No   Current Outpatient Prescriptions on File Prior to Visit  Medication Sig Dispense Refill  . ALPRAZolam (XANAX) 0.25 MG tablet Take 0.25 mg by mouth at bedtime as needed for anxiety or sleep.   0  . calcium-vitamin D (OSCAL WITH D) 500-200 MG-UNIT tablet Take 1 tablet by mouth 3 (three) times daily. 90 tablet 12  . cholecalciferol (VITAMIN D) 1000 units tablet Take 2,000 Units by mouth at bedtime.    . clobetasol ointment (TEMOVATE) 2.99 % Apply 1 application topically 2 (two) times daily as needed (for rash).    . cyclobenzaprine (FLEXERIL) 10 MG tablet Take 10 mg by mouth 3 (three) times daily as needed for muscle spasms.   1  .  escitalopram (LEXAPRO) 20 MG tablet Take 30 mg by mouth daily.   4  . estradiol (ESTRACE) 0.1 MG/GM vaginal cream Place 1 Applicatorful vaginally 3 (three) times a week.    Marland Kitchen Fexofenadine HCl (ALLEGRA PO) Take by mouth.    . hydrochlorothiazide (HYDRODIURIL) 12.5 MG tablet Take 12.5 mg by mouth daily.    Marland Kitchen ibuprofen (ADVIL,MOTRIN) 200 MG tablet Take 400 mg by mouth every 6 (six) hours as needed for headache, mild pain or moderate pain.    Marland Kitchen levothyroxine (SYNTHROID, LEVOTHROID) 50 MCG tablet TAKE 1 TABLET BY MOUTH BEFORE BREAKFAST AND TAKE 2 TABLETS ON SUNDAY 102 tablet 0  .  liothyronine (CYTOMEL) 5 MCG tablet Take 7.5 mcg by mouth daily.    . pantoprazole (PROTONIX) 40 MG tablet Take 40 mg by mouth at bedtime.     . phenylephrine (SUDAFED PE) 10 MG TABS tablet Take 10 mg by mouth every 4 (four) hours as needed (for congestion).     Current Facility-Administered Medications on File Prior to Visit  Medication Dose Route Frequency Provider Last Rate Last Dose  . 0.9 %  sodium chloride infusion  500 mL Intravenous Continuous Pyrtle, Lajuan Lines, MD       Allergies  Allergen Reactions  . Adhesive [Tape] Rash  . Benzoyl Peroxide Rash  . Milk-Related Compounds Rash   Family History  Problem Relation Age of Onset  . Healthy Mother   . Alcohol abuse Father        Deceased  . Stroke Maternal Grandmother   . Arthritis Paternal Grandmother   . Depression Daughter   . Heart murmur Daughter   . Allergic rhinitis Daughter   . Fibromyalgia Daughter   . Allergic rhinitis Daughter   . Migraines Son   . Allergic rhinitis Son   . Brain cancer Sister   . Asthma Sister   . Angioedema Neg Hx   . Eczema Neg Hx   . Immunodeficiency Neg Hx   . Urticaria Neg Hx   . Colon cancer Neg Hx   + see HPI Also:  Sisters with hypertension  Child with PDA  2 daughters with history of thyroiditis  Sister with brain tumor  PE: Wt 232 lb (105.2 kg)   BMI 42.43 kg/m  Body mass index is 42.43 kg/m. Wt  Readings from Last 3 Encounters:  11/03/16 232 lb (105.2 kg)  10/21/16 242 lb (109.8 kg)  09/29/16 233 lb 9.6 oz (106 kg)   Constitutional: obese,, in NAD Eyes: PERRLA, EOMI, no exophthalmos ENT: moist mucous membranes, no thyromegaly, no cervical lymphadenopathy Cardiovascular: RRR, No MRG Respiratory: CTA B Gastrointestinal: abdomen soft, NT, ND, BS+ Musculoskeletal: no deformities, strength intact in all 4 Skin: moist, warm, no rashes Neurological: no tremor with outstretched hands, DTR normal in all 4  ASSESSMENT: 1. Hypothyroidism  2. Vitamin D deficiency  3. Osteopenia  4. MTHFR heterozygosity - C677T  PLAN:  1. Hypothyroidism Patient with long-standing hypothyroidism, on levothyroxine and Cytomel therapy.  - latest thyroid labs reviewed with pt >> normal 2 mo ago - pt feels good on Levothyroxine 50 mcg on 6/7 days, and 100 mcg 1/7 days (LT4) + Cytomel 7.5 mcg (LT3)  - we discussed about taking the thyroid hormone every day, with water, >30 minutes before breakfast, separated by >4 hours from acid reflux medications, calcium, iron, multivitamins. Pt. is taking it correctly - will refill her meds - RTC in 1 year  2. Vitamin D deficiency - She is now on  2800 units Vit D3 daily - We will check a vitamin D level today  3. Osteopeniao - had an ankle fx since last visit >> slipped on porch - not painful - reviewed last DEXA scan results - We can repeat the DEXA scan now, 2 years after previous  4. MTHFR heterozygosity - not on methylated vitamins - will check a homocysteine level, no other intervention necessary per discussion with geneticist at [facility] - she will see a neurologist as her dysesthesia is worse  Component     Latest Ref Rng & Units 11/03/2016  VITD     30.00 - 100.00 ng/mL 43.80  Homocysteine     <  10.4 umol/L 10.7 (H)  Homocysteine slightly high >> will suggest to start a Me-B complex.  Philemon Kingdom, MD PhD [facility] End[facility]

## 2016-11-05 ENCOUNTER — Ambulatory Visit (INDEPENDENT_AMBULATORY_CARE_PROVIDER_SITE_OTHER)
Admission: RE | Admit: 2016-11-05 | Discharge: 2016-11-05 | Disposition: A | Discharge status: Home / Self Care | Payer: Managed Care, Other (non HMO) | Source: Ambulatory Visit | Attending: Internal Medicine | Admitting: Internal Medicine

## 2016-11-05 ENCOUNTER — Encounter: Payer: Self-pay | Admitting: Pulmonary Disease

## 2016-11-05 ENCOUNTER — Ambulatory Visit (INDEPENDENT_AMBULATORY_CARE_PROVIDER_SITE_OTHER): Payer: Managed Care, Other (non HMO) | Admitting: Pulmonary Disease

## 2016-11-05 VITALS — BP 124/76 | HR 62 | Ht 62.5 in | Wt 232.0 lb

## 2016-11-05 DIAGNOSIS — G4733 Obstructive sleep apnea (adult) (pediatric): Secondary | ICD-10-CM

## 2016-11-05 DIAGNOSIS — M85862 Other specified disorders of bone density and structure, left lower leg: Secondary | ICD-10-CM | POA: Diagnosis not present

## 2016-11-05 DIAGNOSIS — Z6841 Body Mass Index (BMI) 40.0 and over, adult: Secondary | ICD-10-CM | POA: Diagnosis not present

## 2016-11-05 DIAGNOSIS — M8580 Other specified disorders of bone density and structure, unspecified site: Secondary | ICD-10-CM

## 2016-11-05 LAB — HOMOCYSTEINE: HOMOCYSTEINE: 10.7 umol/L — AB (ref <10.4)

## 2016-11-05 NOTE — Patient Instructions (Signed)
Follow up in 1 year.

## 2016-11-05 NOTE — Progress Notes (Signed)
Current Outpatient Prescriptions on File Prior to Visit  Medication Sig  . ALPRAZolam (XANAX) 0.25 MG tablet Take 0.25 mg by mouth at bedtime as needed for anxiety or sleep.   . calcium-vitamin D (OSCAL WITH D) 500-200 MG-UNIT tablet Take 1 tablet by mouth 3 (three) times daily.  . cholecalciferol (VITAMIN D) 1000 units tablet Take 2,000 Units by mouth at bedtime.  . clobetasol ointment (TEMOVATE) 5.40 % Apply 1 application topically 2 (two) times daily as needed (for rash).  . cyclobenzaprine (FLEXERIL) 10 MG tablet Take 10 mg by mouth 3 (three) times daily as needed for muscle spasms.   Marland Kitchen escitalopram (LEXAPRO) 20 MG tablet Take 30 mg by mouth daily.   Marland Kitchen estradiol (ESTRACE) 0.1 MG/GM vaginal cream Place 1 Applicatorful vaginally 3 (three) times a week.  Marland Kitchen Fexofenadine HCl (ALLEGRA PO) Take by mouth.  . hydrochlorothiazide (HYDRODIURIL) 12.5 MG tablet Take 12.5 mg by mouth daily.  Marland Kitchen ibuprofen (ADVIL,MOTRIN) 200 MG tablet Take 400 mg by mouth every 6 (six) hours as needed for headache, mild pain or moderate pain.  Marland Kitchen levothyroxine (SYNTHROID, LEVOTHROID) 50 MCG tablet TAKE 1 TABLET BY MOUTH BEFORE BREAKFAST AND TAKE 2 TABLETS ON SUNDAY  . liothyronine (CYTOMEL) 5 MCG tablet Take 1.5 tablets (7.5 mcg total) by mouth daily.  . pantoprazole (PROTONIX) 40 MG tablet Take 40 mg by mouth at bedtime.   . phenylephrine (SUDAFED PE) 10 MG TABS tablet Take 10 mg by mouth every 4 (four) hours as needed (for congestion).   Current Facility-Administered Medications on File Prior to Visit  Medication  . 0.9 %  sodium chloride infusion     Chief Complaint  Patient presents with  . Follow-up    Wears CPAP nightly and with naps. Denies problems with mask/pressure setting. Pt tried different types of nasal pillows. DME: [employer]     Sleep tests PSG 2003 >> AHI 57 CPAP 08/07/16 to 11/04/16 >> used on 89 of 90 nights with average 7 hrs 25 min.  Average AHI 5.5 with CPAP 18 cm H2O.  Past medical  history Allergies, Depression, HTN, Migraines, Hypothyroidism  Past surgical history, Family history, Social history, Allergies reviewed  Vital Signs BP 124/76 (BP Location: Left Arm, Cuff Size: Normal)   Pulse 62   Ht 5' 2.5" (1.588 m)   Wt 232 lb (105.2 kg)   SpO2 96%   BMI 41.76 kg/m   History of Present Illness Mary Reyes is a 56 y.o. female with obstructive sleep apnea.  She uses CPAP nightly.  Can't sleep w/o CPAP.  She is trying to work on weight loss.  She was asking about any new interventions for sleep apnea.  Physical Exam  General - pleasant Eyes - pupils reactive ENT - no sinus tenderness, no oral exudate, no LAN, enlarged tongue, MP 4 Cardiac - regular, no murmur Chest - no wheeze, rales Abd - soft, non tender Ext - no edema Skin - no rashes Neuro - normal strength Psych - normal mood  Assessment/Plan  Obstructive sleep apnea. - she is compliant with CPAP and reports benefit - continue CPAP 18 cm H2O  Obesity. - discussed options to assist with weight loss   Patient Instructions  Follow up in 1 year    Chesley Mires, MD [place] Pager:  [phone] 11/05/2016, 12:03 PM

## 2016-11-06 ENCOUNTER — Encounter: Payer: Self-pay | Admitting: Internal Medicine

## 2016-11-10 NOTE — Addendum Note (Signed)
Addended by: Philemon Kingdom on: 11/10/2016 07:59 AM   Modules accepted: Orders

## 2016-12-14 ENCOUNTER — Other Ambulatory Visit: Payer: Self-pay | Admitting: Internal Medicine

## 2017-01-21 NOTE — Progress Notes (Signed)
Follow-up Visit   Date: 01/22/17    Mary Reyes MRN: [id number] DOB: Jul 29, 1960   Interim History: Mary Reyes is a 56 y.o. right-handed Caucasian female with hypertension, hypothyroidism, GERD returning to the clinic for follow-up of disturbance of skin sensation, muscle cramps, and generalized fatigue.  The patient was accompanied to the clinic by daughter who also provides collateral information.    History of present illness: Starting in early 2000s, she developed abnormal skin sensation over the whole body.  She cannot perceive sensation as well. She has seen a number of neurologists in [place] and underwent two EMGs which were normal.  Around 2013, she developed a spell of slurred speech, gait difficulty, and frequent falls.  She also developed muscle stiffness with generalized pain. There was a concern of whether she had multiple sclerosis and was being followed clinically as her MRI brain was always stable.  Her leg weakness slowly improved with physical therapy.  She moved from [facility] in January 2016.  She endorses feelings of depression and the past and has seen psychiatry.  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  She is not on disability.    UPDATE 01/22/2017: Patient was last seen in April 2016 for the same complaints.  She is having progressive numbness of the arms and legs, which is getting deeper in her skin.  She does not feel hot and cold sensation.  She continues to slur her speech and complains of imbalance.  Her daughter states that she is very cautious and slow when walking, but she does not use a cane.  She feels tired all the time and uses a wheelchair when she is doing activities outside of the home. She has not done physical therapy recently.  She broke her right ankle in November 2017 and did not feel the pain.  Her mood is a little better.   She also complains of muscle cramps, which is worse at night.  She has  not identified any triggers for her symptoms.  There are no alleviating factors.  She feels better when she is at rest.   Medications:  Current Outpatient Prescriptions on File Prior to Visit  Medication Sig Dispense Refill  . ALPRAZolam (XANAX) 0.25 MG tablet Take 0.25 mg by mouth at bedtime as needed for anxiety or sleep.   0  . calcium-vitamin D (OSCAL WITH D) 500-200 MG-UNIT tablet Take 1 tablet by mouth 3 (three) times daily. 90 tablet 12  . cholecalciferol (VITAMIN D) 1000 units tablet Take 2,000 Units by mouth at bedtime.    . clobetasol ointment (TEMOVATE) 8.78 % Apply 1 application topically 2 (two) times daily as needed (for rash).    . cyclobenzaprine (FLEXERIL) 10 MG tablet Take 10 mg by mouth 3 (three) times daily as needed for muscle spasms.   1  . escitalopram (LEXAPRO) 20 MG tablet Take 30 mg by mouth daily.   4  . estradiol (ESTRACE) 0.1 MG/GM vaginal cream Place 1 Applicatorful vaginally 3 (three) times a week.    . hydrochlorothiazide (HYDRODIURIL) 12.5 MG tablet Take 12.5 mg by mouth daily.    Marland Kitchen ibuprofen (ADVIL,MOTRIN) 200 MG tablet Take 400 mg by mouth every 6 (six) hours as needed for headache, mild pain or moderate pain.    Marland Kitchen levothyroxine (SYNTHROID, LEVOTHROID) 50 MCG tablet TAKE 1 TABLET BY MOUTH BEFORE BREAKFAST AND TAKE 2 TABLETS ON SUNDAY 105 tablet 3  . liothyronine (CYTOMEL) 5 MCG  tablet TAKE 1 & 1/2 TABLETS ONCE DAILY 135 tablet 0  . pantoprazole (PROTONIX) 40 MG tablet Take 40 mg by mouth at bedtime.     . phenylephrine (SUDAFED PE) 10 MG TABS tablet Take 10 mg by mouth every 4 (four) hours as needed (for congestion).     Current Facility-Administered Medications on File Prior to Visit  Medication Dose Route Frequency Provider Last Rate Last Dose  . 0.9 %  sodium chloride infusion  500 mL Intravenous Continuous Pyrtle, Lajuan Lines, MD        Allergies:  Allergies  Allergen Reactions  . Adhesive [Tape] Rash  . Benzoyl Peroxide Rash  . Milk-Related Compounds  Rash    Review of Systems:  CONSTITUTIONAL: No fevers, chills, night sweats, or weight loss.  EYES: No visual changes or eye pain ENT: No hearing changes.  No history of nose bleeds.   RESPIRATORY: No cough, wheezing and shortness of breath.   CARDIOVASCULAR: Negative for chest pain, and palpitations.   GI: Negative for abdominal discomfort, blood in stools or black stools.  No recent change in bowel habits.   GU:  No history of incontinence.   MUSCLOSKELETAL: +history of joint pain or swelling.  No myalgias.   SKIN: Negative for lesions, rash, and itching.   ENDOCRINE: Negative for cold or heat intolerance, polydipsia or goiter.   PSYCH:  + depression or anxiety symptoms.   NEURO: As Above.   Vital Signs:  BP 118/70   Pulse 71   Ht 5' 2.5" (1.588 m)   Wt 237 lb (107.5 kg)   SpO2 96%   BMI 42.66 kg/m   General: Well-appearing Neck: No carotid bruit CV: Regular rate and rhythm Pulm:  Clear to auscultation Ext: No edema or deformity  Neurological Exam: MENTAL STATUS including orientation to time, place, person, recent and remote memory, attention span and concentration, language, and fund of knowledge is normal.  Speech is not dysarthric.  CRANIAL NERVES: Funduscopic exam is normal.  No visual field defects.  Pupils equal round and reactive to light.  Normal conjugate, extra-ocular eye movements in all directions of gaze.  No ptosis.  Reduced temperature and pinprick bilaterally over the face in a diffuse pattern.   Face is symmetric. Palate elevates symmetrically.  Tongue is midline.  MOTOR:  Motor strength is 5/5 in all extremities.  No atrophy, fasciculations or abnormal movements.  No pronator drift.  Tone is normal.    MSRs:  Reflexes are 2+/4 throughout.  Plantar responses are downgoing.  SENSORY: She reports diffuse absence of temperature and pinprick over the arms and legs.  There is no sensory level.  Vibration is intact throughout.  Romberg sign is  negative.Marland Kitchen  COORDINATION/GAIT:  Normal finger-to- nose-finger.  Intact rapid alternating movements bilaterally.  Gait wide-based due to body habitus.  She is able to perform heel and toe walking as well as tandem gait, with some difficulty.      DATA: Labs 01/12/2017:  1104  Labs 04/03/2014:  Free T4 0.97  MRI cervical spine wwo contrast 02/12/2006:  Protrusion of the dsc at C7-T1 and T1-T2 as described above with mild bulging of the disc at C5-C6 and C6-C7.  MRI brain wwo contrast 09/14/2011:   1.  White matter tract lesions in the subcortical white matter tracts of both cerebral hemispheres as well as signal changes in the pons present on the new study not confirmed by the old study.  These may represent areas of demyelinating disease. 2.  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 3.  No new or other abnormalities  MRI brain 12/27/2012:  No evidence of abnormal enhancing lesion of the brain; stable white matter changes   IMPRESSION/PLAN: Mary Reyes is a 56 year old female returning for evaluation of progressive abnormal skin sensation, muscle cramps, and generalized fatigue.  The symptoms have been ongoing since early 2000 and she has underwent extensive neurological testing in the past, including EMG x 2 (normal), MRI cervical spine which showed mild degenerative changes, and MRI brain, which shows scattered white matter changes which did not suggest demyelinating disease.  With her nonfocal exam, I recommend additional workup with repeat electrodiagnostic testing and MRI brain.  She is somewhat frustrated because she has had the same testing previously, which did not provide any definitive answers.  Second opinion was offered, and she would like to seek this at a tertiary academic center.  For her cramps, I recommend she start magnesium oxide 400-600 mg daily.  I will also refer her to physical therapy for gait training.  All questions  were answered.  The duration of this appointment visit was 30 minutes of face-to-face time with the patient.  Greater than 50% of this time was spent in counseling, explanation of diagnosis, planning of further management, and coordination of care.   Thank you for allowing me to participate in patient's care.  If I can answer any additional questions, I would be pleased to do so.    Sincerely,    Donika K. Posey Pronto, DO

## 2017-01-22 ENCOUNTER — Ambulatory Visit (INDEPENDENT_AMBULATORY_CARE_PROVIDER_SITE_OTHER): Payer: Managed Care, Other (non HMO) | Admitting: Neurology

## 2017-01-22 ENCOUNTER — Encounter: Payer: Self-pay | Admitting: Neurology

## 2017-01-22 VITALS — BP 118/70 | HR 71 | Ht 62.5 in | Wt 237.0 lb

## 2017-01-22 DIAGNOSIS — R252 Cramp and spasm: Secondary | ICD-10-CM

## 2017-01-22 DIAGNOSIS — R202 Paresthesia of skin: Secondary | ICD-10-CM | POA: Diagnosis not present

## 2017-01-22 DIAGNOSIS — R269 Unspecified abnormalities of gait and mobility: Secondary | ICD-10-CM

## 2017-01-22 NOTE — Patient Instructions (Addendum)
Start balance therapy  Start magnesium oxde 400-600mg  daily We will make a referral to [facility] Neurology for evaluation

## 2017-01-26 ENCOUNTER — Telehealth: Payer: Self-pay | Admitting: *Deleted

## 2017-01-26 NOTE — Telephone Encounter (Signed)
Referral sent to [facility] Neu[facility].

## 2017-06-16 ENCOUNTER — Other Ambulatory Visit: Payer: Self-pay | Admitting: Internal Medicine

## 2017-06-16 DIAGNOSIS — Z1231 Encounter for screening mammogram for malignant neoplasm of breast: Secondary | ICD-10-CM

## 2017-06-29 ENCOUNTER — Ambulatory Visit
Admission: RE | Admit: 2017-06-29 | Discharge: 2017-06-29 | Disposition: A | Discharge status: Home / Self Care | Payer: Managed Care, Other (non HMO) | Source: Ambulatory Visit | Attending: Internal Medicine | Admitting: Internal Medicine

## 2017-06-29 DIAGNOSIS — Z1231 Encounter for screening mammogram for malignant neoplasm of breast: Secondary | ICD-10-CM

## 2017-08-02 ENCOUNTER — Other Ambulatory Visit: Payer: Self-pay

## 2017-08-02 MED ORDER — LEVOTHYROXINE SODIUM 50 MCG PO TABS: ORAL_TABLET | ORAL | 3 refills | Status: DC

## 2017-08-10 ENCOUNTER — Other Ambulatory Visit: Payer: Self-pay

## 2017-08-10 MED ORDER — LIOTHYRONINE SODIUM 5 MCG PO TABS: ORAL_TABLET | ORAL | 0 refills | Status: DC

## 2017-10-26 ENCOUNTER — Ambulatory Visit (INDEPENDENT_AMBULATORY_CARE_PROVIDER_SITE_OTHER): Payer: Managed Care, Other (non HMO) | Admitting: Internal Medicine

## 2017-10-26 ENCOUNTER — Encounter: Payer: Self-pay | Admitting: Internal Medicine

## 2017-10-26 VITALS — BP 128/70 | HR 64 | Ht 62.5 in | Wt 238.6 lb

## 2017-10-26 DIAGNOSIS — E039 Hypothyroidism, unspecified: Secondary | ICD-10-CM

## 2017-10-26 DIAGNOSIS — E7212 Methylenetetrahydrofolate reductase deficiency: Secondary | ICD-10-CM | POA: Diagnosis not present

## 2017-10-26 DIAGNOSIS — Z1589 Genetic susceptibility to other disease: Secondary | ICD-10-CM

## 2017-10-26 DIAGNOSIS — M8580 Other specified disorders of bone density and structure, unspecified site: Secondary | ICD-10-CM | POA: Diagnosis not present

## 2017-10-26 DIAGNOSIS — E559 Vitamin D deficiency, unspecified: Secondary | ICD-10-CM

## 2017-10-26 LAB — T4, FREE: Free T4: 0.66 ng/dL (ref 0.60–1.60)

## 2017-10-26 LAB — TSH: TSH: 1.71 u[IU]/mL (ref 0.35–4.50)

## 2017-10-26 LAB — VITAMIN D 25 HYDROXY (VIT D DEFICIENCY, FRACTURES): VITD: 43.35 ng/mL (ref 30.00–100.00)

## 2017-10-26 NOTE — Patient Instructions (Addendum)
Please stop at the lab.  Please continue Levothyroxine 50 mcg on 6/7 days, and 100 mcg 1/7 days + Cytomel 7.5 mcg.  Take the thyroid hormone every day, with water, at least 30 minutes before breakfast, separated by at least 4 hours from: - acid reflux medications - calcium - iron - multivitamins  Continue vitamin D3 2800 units daily.  Please come back in 1 year.

## 2017-10-26 NOTE — Progress Notes (Addendum)
Patient ID: Mary Reyes, female   DOB: 01-Jan-1961, 57 y.o.   MRN: [id number]   HPI  Mary Reyes is a 57 y.o.-year-old female, returning for f/u for hypothyroidism, vitamin D deficiency and osteopenia. Last visit 1 year ago.  Saw 4 neurologists since last visit.  She ruled out for MS, stroke, or other neurologic disorders.  She will start PT soon.   Hypothyroidism: Reviewed history: She tells me that her thyroid problem started when she found a "lump in her neck" in 2001. She saw ENT at that time, who was following the nodule, which kept growing. She remembers that she was then dx with Mononucleosis. She had a FNA of the nodule and this apparently returned positive for lymphoid tissue. It was considered that this was an enlarged lymph node possibly from mononucleosis. She did not have a thyroid U/S after this episode. She remembers that the nodule did decrease in size after prednisone.  Patient was found to be hyperthyroid during the investigation for her enlarged lymph node. She initially tried oral anti-thyroid medicines, which were not effective. She had RAI tx in 2002. She did not have the start LT4 after her RAI Tx, but became hypothyroid and had to start levothyroxine in 2008. She is not sure why she was recommended to add LT3, but she believes that it was added because of her history of depression.   Pt is on LT4 50 mcg 6/7 days, 100 mcg 1/7 days + LT3 7.5 mcg daily, taken: - in am - fasting - at least 30 min from b'fast - no Fe, MVI - + calcium and PPI later in the day - not on Biotin  I reviewed pt's thyroi reviewed patient's TFTs: Lab Results  Component Value Date   TSH 1.42 09/08/2016   TSH 0.89 02/03/2016   TSH 1.20 01/16/2015   TSH 1.08 07/20/2014   FREET4 0.72 09/08/2016   FREET4 0.84 02/03/2016   FREET4 0.70 01/16/2015   FREET4 0.86 07/20/2014  04/03/2014: TSH 0.92, fT4 0.97, fT3 2.8  Pt denies: - feeling nodules in neck - hoarseness - dysphagia - choking - SOB  with lying down  She has + FH of thyroid disorders in: thyroiditis. No FH of thyroid cancer. + h/o radiation tx to head or neck.  No herbal supplements. No Biotin use. No recent steroids use.   She also has vitamin D deficiency: -Previously on ergocalciferol, now on 2800 units vitamin D daily  Last vit D level: Latest vitamin D level was normal: Lab Results  Component Value Date   VD25OH 43.80 11/03/2016   VD25OH 42.23 02/03/2016   VD25OH 45.34 07/20/2014   Osteopenia: - DXA scan 11/10/2016: L1-L4: T -1.7 RFN: T -1.1 LFN: T -1.0  FRAX MOF risk: 9.4%, Hip fx risk 0.5% - DXA scan 09/05/2014: L1-L4: T -1.6 RFN: T -1.3 LFN: T -1.0 FRAX MOF risk: 5.1%, Hip fx risk 0.3% - She broke her L ankle in 01/2016. She had to have Sx for this. - No previous antiresorptive therapy  She had dysesthesia and I checked her for MTHFR mutation in the past.  Test returned positive for:  Component     Latest Ref Rng & Units 12/11/2015  MTHFR      C677T Single mutation (C677T) identified  Vitamin B12     211 - 911 pg/mL 447  Methylmalonic Acid, Quant     87 - 318 nmol/L 111  Vitamin B12 and MMA normal.  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 If this is high, this could be treated, but otherwise, no intervention is necessary.  Homocystine was slightly high at last visit so I recommended to start methylated B vitamins.  She is taking this inconsistently. Component     Latest Ref Rng & Units 11/03/2016  Homocysteine     <10.4 umol/L 10.7 (H)   Previous endocrinologist Dr. Abel Presto [phone] - in [place]. She also has a history of ?MS, GERD, HTN, OSA, migraines. Father had Lieber's ds.   ROS: Constitutional: no weight gain/no weight loss, + fatigue, no subjective hyperthermia, no  subjective hypothermia, + nocturia Eyes: + blurry vision, no xerophthalmia ENT: no sore throat, + see HPI Cardiovascular: no CP/no SOB/no palpitations/+ leg swelling Respiratory: no cough/no SOB/no wheezing Gastrointestinal: no N/no V/+ D/+ C/+ acid reflux Musculoskeletal: + muscle aches/no joint aches Skin:  + Rash , + hair loss Neurological: no tremors/no numbness/no tingling/no dizziness  I reviewed pt's medications, allergies, PMH, social hx, family hx, and changes were documented in the history of present illness. Otherwise, unchanged from my initial visit note.  Past Medical History:  Diagnosis Date  . Allergy   . Anxiety   . Arthritis   . Blood in stool   . Colon polyp   . Depression   . Eczema   . Frequent headaches   . GERD (gastroesophageal reflux disease)   . Hypertension   . Hypothyroidism   . Migraine   . OSA (obstructive sleep apnea)    CPAP nightly  . Osteopenia   . Sleep apnea   . Thyroid disease   . Trimalleolar fracture of ankle, closed, left, sequela    Past Surgical History:  Procedure Laterality Date  . ABDOMINAL HYSTERECTOMY  2004  . ORIF ANKLE FRACTURE Left 02/12/2016   Procedure: OPEN REDUCTION INTERNAL FIXATION (ORIF) LEFT ANKLE FRACTURE;  Surgeon: Leandrew Koyanagi, MD;  Location: [place];  Service: Orthopedics;  Laterality: Left;  . WISDOM TOOTH EXTRACTION     History   Social History  . Marital Status: Married    Spouse Name: N/A  . Number of Children:  3    Occupational History  . N/a   Social History Main Topics  . Smoking status: Never Smoker   . Smokeless tobacco: Not on file  . Alcohol Use: No  . Drug Use: No   Current Outpatient Medications on File Prior to Visit  Medication Sig Dispense Refill  . ALPRAZolam (XANAX) 0.25 MG tablet Take 0.25 mg by mouth at bedtime as needed for anxiety or sleep.   0  . calcium-vitamin D (OSCAL WITH D) 500-200 MG-UNIT tablet Take 1 tablet by mouth 3 (three) times daily. 90 tablet  12  . cholecalciferol (VITAMIN D) 1000 units tablet Take 2,000 Units by mouth at bedtime.    . clobetasol ointment (TEMOVATE) 2.42 % Apply 1 application topically 2 (two) times daily as needed (for rash).    . cyclobenzaprine (FLEXERIL) 10 MG tablet Take 10 mg by mouth 3 (three) times daily as needed for muscle spasms.   1  . escitalopram (LEXAPRO) 20 MG tablet Take 30 mg by mouth daily.   4  . estradiol (ESTRACE) 0.1 MG/GM vaginal cream Place 1 Applicatorful vaginally 3 (three) times a week.    . hydrochlorothiazide (HYDRODIURIL) 12.5 MG tablet Take  12.5 mg by mouth daily.    Marland Kitchen ibuprofen (ADVIL,MOTRIN) 200 MG tablet Take 400 mg by mouth every 6 (six) hours as needed for headache, mild pain or moderate pain.    Marland Kitchen levocetirizine (XYZAL) 5 MG tablet Take 5 mg by mouth every evening.    Marland Kitchen levothyroxine (SYNTHROID, LEVOTHROID) 50 MCG tablet TAKE 1 TABLET BY MOUTH BEFORE BREAKFAST AND TAKE 2 TABLETS ON SUNDAY 105 tablet 3  . liothyronine (CYTOMEL) 5 MCG tablet TAKE 1 & 1/2 TABLETS ONCE DAILY 135 tablet 0  . pantoprazole (PROTONIX) 40 MG tablet Take 40 mg by mouth at bedtime.     . phenylephrine (SUDAFED PE) 10 MG TABS tablet Take 10 mg by mouth every 4 (four) hours as needed (for congestion).     No current facility-administered medications on file prior to visit.    Allergies  Allergen Reactions  . Adhesive [Tape] Rash  . Benzoyl Peroxide Rash  . Milk-Related Compounds Rash   Family History  Problem Relation Age of Onset  . Healthy Mother   . Alcohol abuse Father        Deceased  . Stroke Maternal Grandmother   . Arthritis Paternal Grandmother   . Depression Daughter   . Heart murmur Daughter   . Allergic rhinitis Daughter   . Fibromyalgia Daughter   . Allergic rhinitis Daughter   . Migraines Son   . Allergic rhinitis Son   . Brain cancer Sister   . Asthma Sister   . Angioedema Neg Hx   . Eczema Neg Hx   . Immunodeficiency Neg Hx   . Urticaria Neg Hx   . Colon cancer Neg Hx   +  see HPI Also:  Sisters with hypertension  Child with PDA  2 daughters with history of thyroiditis  Sister with brain tumor  PE: BP 128/70   Pulse 64   Ht 5' 2.5" (1.588 m)   Wt 238 lb 9.6 oz (108.2 kg)   SpO2 97%   BMI 42.95 kg/m  Body mass index is 42.95 kg/m. Wt Readings from Last 3 Encounters:  10/26/17 238 lb 9.6 oz (108.2 kg)  01/22/17 237 lb (107.5 kg)  11/05/16 232 lb (105.2 kg)   Constitutional: overweight, in NAD Eyes: PERRLA, EOMI, no exophthalmos ENT: moist mucous membranes, no thyromegaly, no cervical lymphadenopathy Cardiovascular: RRR, No MRG Respiratory: CTA B Gastrointestinal: abdomen soft, NT, ND, BS+ Musculoskeletal: no deformities, strength intact in all 4 Skin: moist, warm, no rashes Neurological: no tremor with outstretched hands, DTR normal in all 4  ASSESSMENT: 1. Hypothyroidism  2. Vitamin D deficiency  3. Osteopenia  4. MTHFR heterozygosity - C677T  PLAN:  1. Hypothyroidism Patient with long-standing hypothyroidism, on levothyroxine and Cytomel therapy - latest thyroid labs reviewed with pt >> normal a year ago - she continues on LT4 50 mcg 6/7 days, 100 mcg 1/7 days + LT3 7.5 mcg daily - pt feels good on this dose. - we discussed about taking the thyroid hormone every day, with water, >30 minutes before breakfast, separated by >4 hours from acid reflux medications, calcium, iron, multivitamins. Pt. is taking it correctly. - will check thyroid tests today: TSH, free T3 and fT4 - If labs are abnormal, she will need to return for repeat TFTs in 1.5 months  2. Vitamin D deficiency -She is now on 2800 units Vit D3 daily - We will recheck a vitamin D level today   3. Osteopeniao - Had an ankle fracture before last visit - Reviewed  latest DEXA scan results from 11/10/2016: Osteopenia, but stable T-scores - We discussed that no therapeutic intervention is absolutely needed for now  4. MTHFR heterozygosity -Currently on methylated  vitamins started at last visit, however, she takes this rarely. -We checked a homocystine level at last visit and this was elevated: 10.7 (<10.4) >> will recheck today.  If high, I will advise her to start taking her methylated vitamins consistently.  Office Visit on 10/26/2017  Component Date Value Ref Range Status  . TSH 10/26/2017 1.71  0.35 - 4.50 uIU/mL Final  . Free T4 10/26/2017 0.66  0.60 - 1.60 ng/dL Final   Comment: Specimens from patients who are undergoing biotin therapy and /or ingesting biotin supplements may contain high levels of biotin.  The higher biotin concentration in these specimens interferes with this Free T4 assay.  Specimens that contain high levels  of biotin may cause false high results for this Free T4 assay.  Please interpret results in light of the total clinical presentation of the patient.    Marland Kitchen VITD 10/26/2017 43.35  30.00 - 100.00 ng/mL Final  . Homocysteine 10/26/2017 11.4* <10.4 umol/L Final   Comment: Homocysteine is increased by functional deficiency of  folate or vitamin B12.  Testing for methylmalonic acid  differentiates between these deficiencies.  Other causes  of increased homocysteine include renal failure, folate  antagonists such as methotrexate and phenytoin, and  exposure to nitrous oxide.    TFTs and vitamin D normal.  Homocystine slightly higher >> advised to restart Methylated B vitamins consistently.  Philemon Kingdom, MD PhD [facility]

## 2017-10-27 ENCOUNTER — Other Ambulatory Visit: Payer: Self-pay | Admitting: Internal Medicine

## 2017-10-27 LAB — HOMOCYSTEINE: HOMOCYSTEINE: 11.4 umol/L — AB (ref <10.4)

## 2018-07-28 ENCOUNTER — Other Ambulatory Visit: Payer: Self-pay | Admitting: Internal Medicine

## 2018-10-18 ENCOUNTER — Other Ambulatory Visit: Payer: Self-pay

## 2018-10-20 ENCOUNTER — Other Ambulatory Visit: Payer: Self-pay

## 2018-10-20 ENCOUNTER — Encounter: Payer: Self-pay | Admitting: Internal Medicine

## 2018-10-20 ENCOUNTER — Ambulatory Visit (INDEPENDENT_AMBULATORY_CARE_PROVIDER_SITE_OTHER): Payer: Managed Care, Other (non HMO) | Admitting: Internal Medicine

## 2018-10-20 VITALS — BP 130/90 | HR 67 | Ht 62.5 in | Wt 230.0 lb

## 2018-10-20 DIAGNOSIS — E7212 Methylenetetrahydrofolate reductase deficiency: Secondary | ICD-10-CM

## 2018-10-20 DIAGNOSIS — Z1589 Genetic susceptibility to other disease: Secondary | ICD-10-CM

## 2018-10-20 DIAGNOSIS — M8580 Other specified disorders of bone density and structure, unspecified site: Secondary | ICD-10-CM

## 2018-10-20 DIAGNOSIS — E559 Vitamin D deficiency, unspecified: Secondary | ICD-10-CM

## 2018-10-20 DIAGNOSIS — E039 Hypothyroidism, unspecified: Secondary | ICD-10-CM

## 2018-10-20 LAB — TSH: TSH: 1.4 u[IU]/mL (ref 0.35–4.50)

## 2018-10-20 LAB — T3, FREE: T3, Free: 3 pg/mL (ref 2.3–4.2)

## 2018-10-20 LAB — VITAMIN D 25 HYDROXY (VIT D DEFICIENCY, FRACTURES): VITD: 47.21 ng/mL (ref 30.00–100.00)

## 2018-10-20 LAB — T4, FREE: Free T4: 0.64 ng/dL (ref 0.60–1.60)

## 2018-10-20 NOTE — Patient Instructions (Signed)
Please stop at the lab.  Try to take the methylated vitamin B daily.  Please continue Levothyroxine 50 mcg on 6/7 days, and 100 mcg 1/7 days + Cytomel 7.5 mcg.  Take the thyroid hormone every day, with water, at least 30 minutes before breakfast, separated by at least 4 hours from: - acid reflux medications - calcium - iron - multivitamins  Please come back in 1 year.

## 2018-10-20 NOTE — Progress Notes (Signed)
Patient ID: Mary Reyes, female   DOB: Apr 27, 1960, 58 y.o.   MRN: [id number]   HPI  Mary Reyes is a 58 y.o.-year-old female, returning for f/u for hypothyroidism, vitamin D deficiency and osteopenia. Last visit 1 year ago.  Before last visit, she saw 4 neurologists: She ruled out for stroke, other neurologic disorders.  She was physical therapy.  Hypothyroidism: Reviewed history: She tells me that her thyroid problem started when she found a "lump in her neck" in 2001. She saw ENT at that time, who was following the nodule, which kept growing. She remembers that she was then dx with Mononucleosis. She had a FNA of the nodule and this apparently returned positive for lymphoid tissue. It was considered that this was an enlarged lymph node possibly from mononucleosis. She did not have a thyroid U/S after this episode. She remembers that the nodule did decrease in size after prednisone.  Patient was found to be hyperthyroid during the investigation for her enlarged lymph node. She initially tried oral anti-thyroid medicines, which were not effective. She had RAI tx in 2002. She did not have the start LT4 after her RAI Tx, but became hypothyroid and had to start levothyroxine in 2008. She is not sure why she was recommended to add LT3, but she believes that it was added because of her history of depression.   Patient is on levothyroxine 50 mcg 6/7 days, 100 mcg 1/7 days + liothyronine 7.5 mcg daily, taken: - in am - fasting - at least 30 min from b'fast - no Fe, MVI,  - + (Ca), PPIs later in the day - not on Biotin  Review TFTs: Lab Results  Component Value Date   TSH 1.71 10/26/2017   TSH 1.42 09/08/2016   TSH 0.89 02/03/2016   TSH 1.20 01/16/2015   TSH 1.08 07/20/2014   FREET4 0.66 10/26/2017   FREET4 0.72 09/08/2016   FREET4 0.84 02/03/2016   FREET4 0.70 01/16/2015   FREET4 0.86 07/20/2014  04/03/2014: TSH 0.92, fT4 0.97, fT3 2.8  Pt denies: - feeling nodules in neck -  hoarseness - dysphagia - choking - SOB with lying down  She has + FH of thyroid disorders in: thyroiditis.No FH of thyroid cancer. + h/o radiation tx to head or neck.  No seaweed or kelp. No recent contrast studies. No herbal supplements. No Biotin use. No recent steroids use.   She also has vitamin D deficiency: -Previously on ergocalciferol, then 2800 units vitamin D3 daily  Reviewed vitamin D levels: Lab Results  Component Value Date   VD25OH 43.35 10/26/2017   VD25OH 43.80 11/03/2016   VD25OH 42.23 02/03/2016   VD25OH 45.34 07/20/2014   Osteopenia:  Reviewed previous DXA scan reports: - DXA scan 11/10/2016: L1-L4: T -1.7 RFN: T -1.1 LFN: T -1.0  FRAX MOF risk: 9.4%, Hip fx risk 0.5% - DXA scan 09/05/2014: L1-L4: T -1.6 RFN: T -1.3 LFN: T -1.0 FRAX MOF risk: 5.1%, Hip fx risk 0.3% -She had a left ankle fracture in 01/2016.  She had to have surgery for this. -No previous antiresorptive therapy  She had dysesthesia and I checked her for MTHFR mutation in the past.  The test returned positive for:  Component     Latest Ref Rng & Units 12/11/2015  MTHFR      C677T Single mutation (C677T) identified  Vitamin B12     211 - 911 pg/mL 447  Methylmalonic Acid, Quant     87 - 318 nmol/L 111  Vitamin  B12 and MMA normal.  Pt is heterozygous for the C667T MTHFR mutation >> this is associated with an approximately 30% increase in methylation activity  After the results returned, I called and discussed with the geneticist at [facility], recommended to check a homocystine. If this is high, this could be treated, but otherwise, no intervention is necessary.  Homocystine was slightly high so I recommended to start taking methylated vitamins consistently - now takes these once a week as she forgets Component     Latest Ref Rng & Units 11/03/2016 10/26/2017  Homocysteine     <10.4 umol/L 10.7 (H) 11.4 (H)   Previous endocrinologist Dr. Abel Presto [phone] - in [place]. She also  has a history of ?MS, GERD, HTN, OSA, migraines. Father had Lieber's ds. (adult onset blindness in men) Younger sister just dx'ed Lewy body dementia.  ROS: Constitutional: no weight gain/no weight loss, + fatigue, no subjective hyperthermia, no subjective hypothermia Eyes: no blurry vision, no xerophthalmia ENT: no sore throat, + see HPI Cardiovascular: no CP/no SOB/no palpitations/no leg swelling Respiratory: no cough/no SOB/no wheezing Gastrointestinal: no N/no V/no D/no C/+ acid reflux Musculoskeletal: + muscle aches/no joint aches Skin: no rashes, + intermittent hair loss Neurological: no tremors/no numbness/no tingling/no dizziness, + slurred speech  I reviewed pt's medications, allergies, PMH, social hx, family hx, and changes were documented in the history of present illness. Otherwise, unchanged from my initial visit note.  Past Medical History:  Diagnosis Date  . Allergy   . Anxiety   . Arthritis   . Blood in stool   . Colon polyp   . Depression   . Eczema   . Frequent headaches   . GERD (gastroesophageal reflux disease)   . Hypertension   . Hypothyroidism   . Migraine   . OSA (obstructive sleep apnea)    CPAP nightly  . Osteopenia   . Sleep apnea   . Thyroid disease   . Trimalleolar fracture of ankle, closed, left, sequela    Past Surgical History:  Procedure Laterality Date  . ABDOMINAL HYSTERECTOMY  2004  . ORIF ANKLE FRACTURE Left 02/12/2016   Procedure: OPEN REDUCTION INTERNAL FIXATION (ORIF) LEFT ANKLE FRACTURE;  Surgeon: Mary Koyanagi, MD;  Location: [place];  Service: Orthopedics;  Laterality: Left;  . WISDOM TOOTH EXTRACTION     History   Social History  . Marital Status: Married    Spouse Name: N/A  . Number of Children:  3    Occupational History  . N/a   Social History Main Topics  . Smoking status: Never Smoker   . Smokeless tobacco: Not on file  . Alcohol Use: No  . Drug Use: No   Current Outpatient Medications on  File Prior to Visit  Medication Sig Dispense Refill  . ALPRAZolam (XANAX) 0.25 MG tablet Take 0.25 mg by mouth at bedtime as needed for anxiety or sleep.   0  . calcium-vitamin D (OSCAL WITH D) 500-200 MG-UNIT tablet Take 1 tablet by mouth 3 (three) times daily. 90 tablet 12  . cholecalciferol (VITAMIN D) 1000 units tablet Take 2,000 Units by mouth at bedtime.    . clobetasol ointment (TEMOVATE) 2.42 % Apply 1 application topically 2 (two) times daily as needed (for rash).    . cyclobenzaprine (FLEXERIL) 10 MG tablet Take 10 mg by mouth 3 (three) times daily as needed for muscle spasms.   1  . escitalopram (LEXAPRO) 20 MG tablet Take 30 mg by mouth daily.  4  . fexofenadine (ALLEGRA ALLERGY) 180 MG tablet Take by mouth.    . hydrochlorothiazide (HYDRODIURIL) 12.5 MG tablet Take 12.5 mg by mouth daily.    Marland Kitchen ibuprofen (ADVIL,MOTRIN) 200 MG tablet Take 400 mg by mouth every 6 (six) hours as needed for headache, mild pain or moderate pain.    Marland Kitchen levothyroxine (SYNTHROID) 50 MCG tablet TAKE 1 TABLET BY MOUTH BEFORE BREAKFAST 6 DAYS PER WEEK AND TAKE 2 TABLETS BY MOUTH ON SUNDAY 105 tablet 3  . liothyronine (CYTOMEL) 5 MCG tablet TAKE ONE AND ONE-HALF (1 & 1/2) TABLETS BY MOUTH ONE TIME DAILY 135 tablet 3  . pantoprazole (PROTONIX) 40 MG tablet Take 40 mg by mouth at bedtime.     . phenylephrine (SUDAFED PE) 10 MG TABS tablet Take 10 mg by mouth every 4 (four) hours as needed (for congestion).     No current facility-administered medications on file prior to visit.    Allergies  Allergen Reactions  . Adhesive [Tape] Rash  . Benzoyl Peroxide Rash  . Milk-Related Compounds Rash   Family History  Problem Relation Age of Onset  . Healthy Mother   . Alcohol abuse Father        Deceased  . Stroke Maternal Grandmother   . Arthritis Paternal Grandmother   . Depression Daughter   . Heart murmur Daughter   . Allergic rhinitis Daughter   . Fibromyalgia Daughter   . Allergic rhinitis Daughter   .  Migraines Son   . Allergic rhinitis Son   . Brain cancer Sister   . Asthma Sister   . Angioedema Neg Hx   . Eczema Neg Hx   . Immunodeficiency Neg Hx   . Urticaria Neg Hx   . Colon cancer Neg Hx   + see HPI Also:  Sisters with hypertension  Child with PDA  2 daughters with history of thyroiditis  Sister with brain tumor  PE: There were no vitals taken for this visit. There is no height or weight on file to calculate BMI. Wt Readings from Last 3 Encounters:  10/26/17 238 lb 9.6 oz (108.2 kg)  01/22/17 237 lb (107.5 kg)  11/05/16 232 lb (105.2 kg)   Constitutional: overweight, in NAD Eyes: PERRLA, EOMI, no exophthalmos ENT: moist mucous membranes, no thyromegaly, no cervical lymphadenopathy Cardiovascular: RRR, No MRG Respiratory: CTA B Gastrointestinal: abdomen soft, NT, ND, BS+ Musculoskeletal: no deformities, strength intact in all 4 Skin: moist, warm, no rashes Neurological: no tremor with outstretched hands, DTR normal in all 4  ASSESSMENT: 1. Hypothyroidism  2. Vitamin D deficiency  3. Osteopenia  4. MTHFR heterozygosity - C677T  PLAN:  1. Hypothyroidism -Patient with longstanding hypothyroidism, on levothyroxine and liothyronine therapy - latest thyroid labs reviewed with pt >> normal  - she continues on  LT4 50 mcg 6/7 days, 100 mcg 1/7 days + LT3 7.5 mcg daily - pt feels good on this dose. - we discussed about taking the thyroid hormone every day, with water, >30 minutes before breakfast, separated by >4 hours from acid reflux medications, calcium, iron, multivitamins. Pt. is taking it correctly. - will check thyroid tests today: TSH and fT4 - If labs are abnormal, she will need to return for repeat TFTs in 1.5 months  2. Vitamin D deficiency -Continue 2800 units vitamin D3 daily -Recheck level today  3. Osteopeniao -She had an ankle fracture in 2017 -Reviewed latest DXA scan results 11/10/2016: Osteopenia but stable T-scores -We will need to  repeat the DXA  scan this year  4. MTHFR heterozygosity -at last visit her homocystine level was still high and I advised her to take methylated vitamin B consistently - recheck homocysteine  Needs refills TFTs.  Office Visit on 10/20/2018  Component Date Value Ref Range Status  . TSH 10/20/2018 1.40  0.35 - 4.50 uIU/mL Final  . Free T4 10/20/2018 0.64  0.60 - 1.60 ng/dL Final   Comment: Specimens from patients who are undergoing biotin therapy and /or ingesting biotin supplements may contain high levels of biotin.  The higher biotin concentration in these specimens interferes with this Free T4 assay.  Specimens that contain high levels  of biotin may cause false high results for this Free T4 assay.  Please interpret results in light of the total clinical presentation of the patient.    . T3, Free 10/20/2018 3.0  2.3 - 4.2 pg/mL Final  . VITD 10/20/2018 47.21  30.00 - 100.00 ng/mL Final  . Homocysteine 10/20/2018 11.6* <10.4 umol/L Final   Comment: Homocysteine is increased by functional deficiency of  folate or vitamin B12. Testing for methylmalonic acid  differentiates between these deficiencies. Other causes  of increased homocysteine include renal failure, folate  antagonists such as methotrexate and phenytoin, and  exposure to nitrous oxide. Mary Reyes, et al., Ann Intern Med. 1999;131(5):331-9.    Labs are normal with homocystine is still high.  I advised the patient to take the medicine regularly vitamins daily.  Philemon Kingdom, MD PhD [facility]

## 2018-10-21 LAB — HOMOCYSTEINE: Homocysteine: 11.6 umol/L — ABNORMAL HIGH (ref <10.4)

## 2018-10-21 MED ORDER — LEVOTHYROXINE SODIUM 50 MCG PO TABS: ORAL_TABLET | ORAL | 3 refills | Status: DC

## 2018-10-21 MED ORDER — LIOTHYRONINE SODIUM 5 MCG PO TABS: ORAL_TABLET | ORAL | 3 refills | Status: DC

## 2019-03-01 ENCOUNTER — Other Ambulatory Visit: Payer: Self-pay

## 2019-03-02 ENCOUNTER — Ambulatory Visit (INDEPENDENT_AMBULATORY_CARE_PROVIDER_SITE_OTHER): Payer: Managed Care, Other (non HMO) | Admitting: Internal Medicine

## 2019-03-02 ENCOUNTER — Encounter: Payer: Self-pay | Admitting: Internal Medicine

## 2019-03-02 VITALS — BP 140/88 | HR 92 | Temp 97.6°F | Ht 62.75 in | Wt 236.6 lb

## 2019-03-02 DIAGNOSIS — F331 Major depressive disorder, recurrent, moderate: Secondary | ICD-10-CM

## 2019-03-02 DIAGNOSIS — E559 Vitamin D deficiency, unspecified: Secondary | ICD-10-CM

## 2019-03-02 DIAGNOSIS — K219 Gastro-esophageal reflux disease without esophagitis: Secondary | ICD-10-CM

## 2019-03-02 DIAGNOSIS — I10 Essential (primary) hypertension: Secondary | ICD-10-CM | POA: Diagnosis not present

## 2019-03-02 DIAGNOSIS — E039 Hypothyroidism, unspecified: Secondary | ICD-10-CM | POA: Diagnosis not present

## 2019-03-02 NOTE — Patient Instructions (Signed)
-Nice seeing you today!!  -Increase HCTZ to 25 mg daily. Low salt diet (see below).  -Follow up in 6 weeks.   DASH Eating Plan DASH stands for "Dietary Approaches to Stop Hypertension." The DASH eating plan is a healthy eating plan that has been shown to reduce high blood pressure (hypertension). It may also reduce your risk for type 2 diabetes, heart disease, and stroke. The DASH eating plan may also help with weight loss. What are tips for following this plan?  General guidelines  Avoid eating more than 2,300 mg (milligrams) of salt (sodium) a day. If you have hypertension, you may need to reduce your sodium intake to 1,500 mg a day.  Limit alcohol intake to no more than 1 drink a day for nonpregnant women and 2 drinks a day for men. One drink equals 12 oz of beer, 5 oz of wine, or 1 oz of hard liquor.  Work with your health care provider to maintain a healthy body weight or to lose weight. Ask what an ideal weight is for you.  Get at least 30 minutes of exercise that causes your heart to beat faster (aerobic exercise) most days of the week. Activities may include walking, swimming, or biking.  Work with your health care provider or diet and nutrition specialist (dietitian) to adjust your eating plan to your individual calorie needs. Reading food labels   Check food labels for the amount of sodium per serving. Choose foods with less than 5 percent of the Daily Value of sodium. Generally, foods with less than 300 mg of sodium per serving fit into this eating plan.  To find whole grains, look for the word "whole" as the first word in the ingredient list. Shopping  Buy products labeled as "low-sodium" or "no salt added."  Buy fresh foods. Avoid canned foods and premade or frozen meals. Cooking  Avoid adding salt when cooking. Use salt-free seasonings or herbs instead of table salt or sea salt. Check with your health care provider or pharmacist before using salt substitutes.  Do  not fry foods. Cook foods using healthy methods such as baking, boiling, grilling, and broiling instead.  Cook with heart-healthy oils, such as olive, canola, soybean, or sunflower oil. Meal planning  Eat a balanced diet that includes: ? 5 or more servings of fruits and vegetables each day. At each meal, try to fill half of your plate with fruits and vegetables. ? Up to 6-8 servings of whole grains each day. ? Less than 6 oz of lean meat, poultry, or fish each day. A 3-oz serving of meat is about the same size as a deck of cards. One egg equals 1 oz. ? 2 servings of low-fat dairy each day. ? A serving of nuts, seeds, or beans 5 times each week. ? Heart-healthy fats. Healthy fats called Omega-3 fatty acids are found in foods such as flaxseeds and coldwater fish, like sardines, salmon, and mackerel.  Limit how much you eat of the following: ? Canned or prepackaged foods. ? Food that is high in trans fat, such as fried foods. ? Food that is high in saturated fat, such as fatty meat. ? Sweets, desserts, sugary drinks, and other foods with added sugar. ? Full-fat dairy products.  Do not salt foods before eating.  Try to eat at least 2 vegetarian meals each week.  Eat more home-cooked food and less restaurant, buffet, and fast food.  When eating at a restaurant, ask that your food be prepared with less  salt or no salt, if possible. What foods are recommended? The items listed may not be a complete list. Talk with your dietitian about what dietary choices are best for you. Grains Whole-grain or whole-wheat bread. Whole-grain or whole-wheat pasta. Brown rice. [place]. Bulgur. Whole-grain and low-sodium cereals. Pita bread. Low-fat, low-sodium crackers. Whole-wheat flour tortillas. Vegetables Fresh or frozen vegetables (raw, steamed, roasted, or grilled). Low-sodium or reduced-sodium tomato and vegetable juice. Low-sodium or reduced-sodium tomato sauce and tomato paste. Low-sodium or  reduced-sodium canned vegetables. Fruits All fresh, dried, or frozen fruit. Canned fruit in natural juice (without added sugar). Meat and other protein foods Skinless chicken or [place]. Ground chicken or [place]. Pork with fat trimmed off. Fish and seafood. Egg whites. Dried beans, peas, or lentils. Unsalted nuts, nut butters, and seeds. Unsalted canned beans. Lean cuts of beef with fat trimmed off. Low-sodium, lean deli meat. Dairy Low-fat (1%) or fat-free (skim) milk. Fat-free, low-fat, or reduced-fat cheeses. Nonfat, low-sodium ricotta or cottage cheese. Low-fat or nonfat yogurt. Low-fat, low-sodium cheese. Fats and oils Soft margarine without trans fats. Vegetable oil. Low-fat, reduced-fat, or light mayonnaise and salad dressings (reduced-sodium). Canola, safflower, olive, soybean, and sunflower oils. Avocado. Seasoning and other foods Herbs. Spices. Seasoning mixes without salt. Unsalted popcorn and pretzels. Fat-free sweets. What foods are not recommended? The items listed may not be a complete list. Talk with your dietitian about what dietary choices are best for you. Grains Baked goods made with fat, such as croissants, muffins, or some breads. Dry pasta or rice meal packs. Vegetables Creamed or fried vegetables. Vegetables in a cheese sauce. Regular canned vegetables (not low-sodium or reduced-sodium). Regular canned tomato sauce and paste (not low-sodium or reduced-sodium). Regular tomato and vegetable juice (not low-sodium or reduced-sodium). Angie Fava. Olives. Fruits Canned fruit in a light or heavy syrup. Fried fruit. Fruit in cream or butter sauce. Meat and other protein foods Fatty cuts of meat. Ribs. Fried meat. Berniece Salines. Sausage. Bologna and other processed lunch meats. Salami. Fatback. Hotdogs. Bratwurst. Salted nuts and seeds. Canned beans with added salt. Canned or smoked fish. Whole eggs or egg yolks. Chicken or [place] with skin. Dairy Whole or 2% milk, cream, and half-and-half.  Whole or full-fat cream cheese. Whole-fat or sweetened yogurt. Full-fat cheese. Nondairy creamers. Whipped toppings. Processed cheese and cheese spreads. Fats and oils Butter. Stick margarine. Lard. Shortening. Ghee. Bacon fat. Tropical oils, such as coconut, palm kernel, or palm oil. Seasoning and other foods Salted popcorn and pretzels. Onion salt, garlic salt, seasoned salt, table salt, and sea salt. Worcestershire sauce. Tartar sauce. Barbecue sauce. Teriyaki sauce. Soy sauce, including reduced-sodium. Steak sauce. Canned and packaged gravies. Fish sauce. Oyster sauce. Cocktail sauce. Horseradish that you find on the shelf. Ketchup. Mustard. Meat flavorings and tenderizers. Bouillon cubes. Hot sauce and Tabasco sauce. Premade or packaged marinades. Premade or packaged taco seasonings. Relishes. Regular salad dressings. Where to find more information:  National Heart, Lung, and [place]: https://wilson-eaton.com/  American Heart Association: www.heart.org Summary  The DASH eating plan is a healthy eating plan that has been shown to reduce high blood pressure (hypertension). It may also reduce your risk for type 2 diabetes, heart disease, and stroke.  With the DASH eating plan, you should limit salt (sodium) intake to 2,300 mg a day. If you have hypertension, you may need to reduce your sodium intake to 1,500 mg a day.  When on the DASH eating plan, aim to eat more fresh fruits and vegetables, whole grains, lean proteins, low-fat  dairy, and heart-healthy fats.  Work with your health care provider or diet and nutrition specialist (dietitian) to adjust your eating plan to your individual calorie needs. This information is not intended to replace advice given to you by your health care provider. Make sure you discuss any questions you have with your health care provider. Document Released: 03/05/2011 Document Revised: 02/26/2017 Document Reviewed: 03/09/2016 Elsevier Patient Education  2020  [employer].

## 2019-03-02 NOTE — Progress Notes (Signed)
New Patient Office Visit     This visit occurred during the SARS-CoV-2 public health emergency.  Safety protocols were in place, including screening questions prior to the visit, additional usage of staff PPE, and extensive cleaning of exam room while observing appropriate contact time as indicated for disinfecting solutions.    CC/Reason for Visit: Establish care, discuss chronic medical conditions Previous PCP: Dr. Fara Olden with [facility] Last Visit: Earlier this year  HPI: Mary Reyes is a 58 y.o. female who is coming in today for the above mentioned reasons.  She has a complex past medical history significant for:  1.  Hypertension that has not been well controlled recently on 12.5 mg of hydrochlorothiazide.  She brings in her blood pressure log and systolics are between [id number] and 160.  2.  Obstructive sleep apnea that is well controlled on CPAP.  3.  GERD that is well controlled on famotidine.  5.  Iatrogenic hypothyroidism following radioactive iodine ablation for Graves' disease in 2001.  She is followed by endocrinology, Dr. Cruzita Lederer, and is on both levothyroxine and Cytomel.  6.  Depression which is severe and followed by psychiatry.  She is currently seeing them on a monthly basis for medication adjustments and feels like just recently she has gotten a "good place".  7.  Morbid obesity  8.  Vitamin D deficiency  She also states she has this "weird nerve problem" she has seen multiple neurologists and nobody has been able to figure out her situation.  She will occasionally slur her words and have paresthesias, there also seems to be a familial decrease in pain sensations.  She suffered a severe ankle fracture and never noticed that it was fractured until she looked down.  She has had siblings who have fallen into a pot of hot grease and suffered severe burns without any pain.  She does not work, she has been married for 52 years has 3 children 1 of whom is local.  She has no  known drug allergies she is a non-smoker, does not drink alcohol her family history significant for a sister with early onset dementia father who was an alcoholic.  She states "everybody in my family has depression" including her mother, her siblings and children.  Her main complaint today and the reason for the visit is her blood pressure management.   Past Medical/Surgical History: Past Medical History:  Diagnosis Date   Allergy    Anxiety    Arthritis    Blood in stool    Colon polyp    Depression    Eczema    Frequent headaches    GERD (gastroesophageal reflux disease)    Hypertension    Hypothyroidism    Migraine    OSA (obstructive sleep apnea)    CPAP nightly   Osteopenia    Sleep apnea    Thyroid disease    Trimalleolar fracture of ankle, closed, left, sequela     Past Surgical History:  Procedure Laterality Date   ABDOMINAL HYSTERECTOMY  2004   ORIF ANKLE FRACTURE Left 02/12/2016   Procedure: OPEN REDUCTION INTERNAL FIXATION (ORIF) LEFT ANKLE FRACTURE;  Surgeon: Leandrew Koyanagi, MD;  Location: [place];  Service: Orthopedics;  Laterality: Left;   WISDOM TOOTH EXTRACTION      Social History:  reports that she has never smoked. She has never used smokeless tobacco. She reports that she does not drink alcohol or use drugs.  Allergies: Allergies  Allergen Reactions   Adhesive [  Tape] Rash   Benzoyl Peroxide Rash    Benzine    Milk-Related Compounds Rash    Family History:  Family History  Problem Relation Age of Onset   Healthy Mother    Alcohol abuse Father        Deceased   Stroke Maternal Grandmother    Arthritis Paternal Grandmother    Depression Daughter    Heart murmur Daughter    Allergic rhinitis Daughter    Fibromyalgia Daughter    Allergic rhinitis Daughter    Migraines Son    Allergic rhinitis Son    Brain cancer Sister    Asthma Sister    Angioedema Neg Hx    Eczema Neg Hx     Immunodeficiency Neg Hx    Urticaria Neg Hx    Colon cancer Neg Hx      Current Outpatient Medications:    ALPRAZolam (XANAX) 0.25 MG tablet, Take 0.25 mg by mouth at bedtime as needed for anxiety or sleep. , Disp: , Rfl: 0   ARIPiprazole (ABILIFY) 2 MG tablet, Take 2 mg by mouth daily., Disp: , Rfl:    Azelastine HCl 0.15 % SOLN, Place into the nose., Disp: , Rfl:    buPROPion (WELLBUTRIN XL) 150 MG 24 hr tablet, 300 mg daily. , Disp: , Rfl:    calcium-vitamin D (OSCAL WITH D) 500-200 MG-UNIT tablet, Take 1 tablet by mouth 3 (three) times daily., Disp: 90 tablet, Rfl: 12   cholecalciferol (VITAMIN D) 1000 units tablet, Take 2,000 Units by mouth at bedtime., Disp: , Rfl:    cyclobenzaprine (FLEXERIL) 10 MG tablet, Take 10 mg by mouth 3 (three) times daily as needed for muscle spasms. , Disp: , Rfl: 1   Desvenlafaxine ER (PRISTIQ) 50 MG TB24, 100 mg daily. , Disp: , Rfl:    famotidine-calcium carbonate-magnesium hydroxide (PEPCID COMPLETE) 10-800-165 MG chewable tablet, Chew 1 tablet by mouth daily as needed., Disp: , Rfl:    fexofenadine (ALLEGRA ALLERGY) 180 MG tablet, Take by mouth., Disp: , Rfl:    fluticasone (FLONASE) 50 MCG/ACT nasal spray, Place into both nostrils daily., Disp: , Rfl:    hydrochlorothiazide (HYDRODIURIL) 12.5 MG tablet, Take 12.5 mg by mouth daily., Disp: , Rfl:    ibuprofen (ADVIL,MOTRIN) 200 MG tablet, Take 400 mg by mouth every 6 (six) hours as needed for headache, mild pain or moderate pain., Disp: , Rfl:    levothyroxine (SYNTHROID) 50 MCG tablet, TAKE 1 TABLET BY MOUTH BEFORE BREAKFAST 6 DAYS PER WEEK AND TAKE 2 TABLETS BY MOUTH ON SUNDAY, Disp: 105 tablet, Rfl: 3   liothyronine (CYTOMEL) 5 MCG tablet, TAKE ONE AND ONE-HALF (1 & 1/2) TABLETS BY MOUTH ONE TIME DAILY, Disp: 135 tablet, Rfl: 3   phenylephrine (SUDAFED PE) 10 MG TABS tablet, Take 10 mg by mouth every 4 (four) hours as needed (for congestion)., Disp: , Rfl:    clobetasol ointment  (TEMOVATE) [id number] %, Apply 1 application topically 2 (two) times daily as needed (for rash)., Disp: , Rfl:   Review of Systems:  Constitutional: Denies fever, chills, diaphoresis, appetite change and fatigue.  HEENT: Denies photophobia, eye pain, redness, hearing loss, ear pain, congestion, sore throat, rhinorrhea, sneezing, mouth sores, trouble swallowing, neck pain, neck stiffness and tinnitus.   Respiratory: Denies SOB, DOE, cough, chest tightness,  and wheezing.   Cardiovascular: Denies chest pain, palpitations and leg swelling.  Gastrointestinal: Denies nausea, vomiting, abdominal pain, diarrhea, constipation, blood in stool and abdominal distention.  Genitourinary: Denies dysuria, urgency,  frequency, hematuria, flank pain and difficulty urinating.  Endocrine: Denies: hot or cold intolerance, sweats, changes in hair or nails, polyuria, polydipsia. Musculoskeletal: Denies myalgias, back pain, joint swelling, arthralgias and gait problem.  Skin: Denies pallor, rash and wound.  Neurological: Denies dizziness, seizures, syncope, weakness, light-headedness, numbness and headaches.  Hematological: Denies adenopathy. Easy bruising, personal or family bleeding history  Psychiatric/Behavioral: Denies suicidal ideation, confusion, nervousness, sleep disturbance and agitation    Physical Exam: Vitals:   03/02/19 1414  BP: 140/88  Pulse: 92  Temp: 97.6 F (36.4 C)  TempSrc: Temporal  SpO2: 97%  Weight: 236 lb 9.6 oz (107.3 kg)  Height: 5' 2.75" (1.594 m)   Body mass index is 42.25 kg/m.  Constitutional: NAD, calm, comfortable Eyes: PERRL, lids and conjunctivae normal, wears corrective lenses ENMT: Mucous membranes are moist.  Respiratory: clear to auscultation bilaterally, no wheezing, no crackles. Normal respiratory effort. No accessory muscle use.  Cardiovascular: Regular rate and rhythm, no murmurs / rubs / gallops. No extremity edema. 2+ pedal pulses.  Abdomen: no tenderness, no  masses palpated. No hepatosplenomegaly. Bowel sounds positive.  Musculoskeletal: no clubbing / cyanosis. No joint deformity upper and lower extremities. Good ROM, no contractures. Normal muscle tone.  Skin: no rashes, lesions, ulcers. No induration Neurologic: Grossly intact and nonfocal Psychiatric: Normal judgment and insight. Alert and oriented x 3. Normal mood.    Impression and Plan:  Essential hypertension -Increase hydrochlorothiazide from 12.5 to 25 mg, follow-up in office in 6 weeks for blood pressure check, she will bring ambulatory measurements. -Consider basic metabolic profile at that time to follow renal function and electrolytes on increased dose.  Gastroesophageal reflux disease, unspecified whether esophagitis present -Well-controlled on H2 blocker.  Acquired hypothyroidism -Followed by endocrinology with a normal TSH of 1.400 in July 2020  Moderate episode of recurrent major depressive disorder ([place]) -Followed closely by psychiatry on multiple psychotropic medications.  Vitamin D deficiency -Check vitamin D levels when she returns for her annual physical.  Morbid obesity ([place]) -Discussed healthy lifestyle, including increased physical activity and better food choices to promote weight loss.     Patient Instructions  -Nice seeing you today!!  -Increase HCTZ to 25 mg daily. Low salt diet (see below).  -Follow up in 6 weeks.   DASH Eating Plan DASH stands for "Dietary Approaches to Stop Hypertension." The DASH eating plan is a healthy eating plan that has been shown to reduce high blood pressure (hypertension). It may also reduce your risk for type 2 diabetes, heart disease, and stroke. The DASH eating plan may also help with weight loss. What are tips for following this plan?  General guidelines  Avoid eating more than 2,300 mg (milligrams) of salt (sodium) a day. If you have hypertension, you may need to reduce your sodium intake to 1,500 mg a  day.  Limit alcohol intake to no more than 1 drink a day for nonpregnant women and 2 drinks a day for men. One drink equals 12 oz of beer, 5 oz of wine, or 1 oz of hard liquor.  Work with your health care provider to maintain a healthy body weight or to lose weight. Ask what an ideal weight is for you.  Get at least 30 minutes of exercise that causes your heart to beat faster (aerobic exercise) most days of the week. Activities may include walking, swimming, or biking.  Work with your health care provider or diet and nutrition specialist (dietitian) to adjust your eating plan to your individual  calorie needs. Reading food labels   Check food labels for the amount of sodium per serving. Choose foods with less than 5 percent of the Daily Value of sodium. Generally, foods with less than 300 mg of sodium per serving fit into this eating plan.  To find whole grains, look for the word "whole" as the first word in the ingredient list. Shopping  Buy products labeled as "low-sodium" or "no salt added."  Buy fresh foods. Avoid canned foods and premade or frozen meals. Cooking  Avoid adding salt when cooking. Use salt-free seasonings or herbs instead of table salt or sea salt. Check with your health care provider or pharmacist before using salt substitutes.  Do not fry foods. Cook foods using healthy methods such as baking, boiling, grilling, and broiling instead.  Cook with heart-healthy oils, such as olive, canola, soybean, or sunflower oil. Meal planning  Eat a balanced diet that includes: ? 5 or more servings of fruits and vegetables each day. At each meal, try to fill half of your plate with fruits and vegetables. ? Up to 6-8 servings of whole grains each day. ? Less than 6 oz of lean meat, poultry, or fish each day. A 3-oz serving of meat is about the same size as a deck of cards. One egg equals 1 oz. ? 2 servings of low-fat dairy each day. ? A serving of nuts, seeds, or beans 5 times  each week. ? Heart-healthy fats. Healthy fats called Omega-3 fatty acids are found in foods such as flaxseeds and coldwater fish, like sardines, salmon, and mackerel.  Limit how much you eat of the following: ? Canned or prepackaged foods. ? Food that is high in trans fat, such as fried foods. ? Food that is high in saturated fat, such as fatty meat. ? Sweets, desserts, sugary drinks, and other foods with added sugar. ? Full-fat dairy products.  Do not salt foods before eating.  Try to eat at least 2 vegetarian meals each week.  Eat more home-cooked food and less restaurant, buffet, and fast food.  When eating at a restaurant, ask that your food be prepared with less salt or no salt, if possible. What foods are recommended? The items listed may not be a complete list. Talk with your dietitian about what dietary choices are best for you. Grains Whole-grain or whole-wheat bread. Whole-grain or whole-wheat pasta. Brown rice. [place]. Bulgur. Whole-grain and low-sodium cereals. Pita bread. Low-fat, low-sodium crackers. Whole-wheat flour tortillas. Vegetables Fresh or frozen vegetables (raw, steamed, roasted, or grilled). Low-sodium or reduced-sodium tomato and vegetable juice. Low-sodium or reduced-sodium tomato sauce and tomato paste. Low-sodium or reduced-sodium canned vegetables. Fruits All fresh, dried, or frozen fruit. Canned fruit in natural juice (without added sugar). Meat and other protein foods Skinless chicken or [place]. Ground chicken or [place]. Pork with fat trimmed off. Fish and seafood. Egg whites. Dried beans, peas, or lentils. Unsalted nuts, nut butters, and seeds. Unsalted canned beans. Lean cuts of beef with fat trimmed off. Low-sodium, lean deli meat. Dairy Low-fat (1%) or fat-free (skim) milk. Fat-free, low-fat, or reduced-fat cheeses. Nonfat, low-sodium ricotta or cottage cheese. Low-fat or nonfat yogurt. Low-fat, low-sodium cheese. Fats and oils Soft margarine  without trans fats. Vegetable oil. Low-fat, reduced-fat, or light mayonnaise and salad dressings (reduced-sodium). Canola, safflower, olive, soybean, and sunflower oils. Avocado. Seasoning and other foods Herbs. Spices. Seasoning mixes without salt. Unsalted popcorn and pretzels. Fat-free sweets. What foods are not recommended? The items listed may not be a  complete list. Talk with your dietitian about what dietary choices are best for you. Grains Baked goods made with fat, such as croissants, muffins, or some breads. Dry pasta or rice meal packs. Vegetables Creamed or fried vegetables. Vegetables in a cheese sauce. Regular canned vegetables (not low-sodium or reduced-sodium). Regular canned tomato sauce and paste (not low-sodium or reduced-sodium). Regular tomato and vegetable juice (not low-sodium or reduced-sodium). Angie Fava. Olives. Fruits Canned fruit in a light or heavy syrup. Fried fruit. Fruit in cream or butter sauce. Meat and other protein foods Fatty cuts of meat. Ribs. Fried meat. Berniece Salines. Sausage. Bologna and other processed lunch meats. Salami. Fatback. Hotdogs. Bratwurst. Salted nuts and seeds. Canned beans with added salt. Canned or smoked fish. Whole eggs or egg yolks. Chicken or [place] with skin. Dairy Whole or 2% milk, cream, and half-and-half. Whole or full-fat cream cheese. Whole-fat or sweetened yogurt. Full-fat cheese. Nondairy creamers. Whipped toppings. Processed cheese and cheese spreads. Fats and oils Butter. Stick margarine. Lard. Shortening. Ghee. Bacon fat. Tropical oils, such as coconut, palm kernel, or palm oil. Seasoning and other foods Salted popcorn and pretzels. Onion salt, garlic salt, seasoned salt, table salt, and sea salt. Worcestershire sauce. Tartar sauce. Barbecue sauce. Teriyaki sauce. Soy sauce, including reduced-sodium. Steak sauce. Canned and packaged gravies. Fish sauce. Oyster sauce. Cocktail sauce. Horseradish that you find on the shelf. Ketchup.  Mustard. Meat flavorings and tenderizers. Bouillon cubes. Hot sauce and Tabasco sauce. Premade or packaged marinades. Premade or packaged taco seasonings. Relishes. Regular salad dressings. Where to find more information:  National Heart, Lung, and [place]: https://wilson-eaton.com/  American Heart Association: www.heart.org Summary  The DASH eating plan is a healthy eating plan that has been shown to reduce high blood pressure (hypertension). It may also reduce your risk for type 2 diabetes, heart disease, and stroke.  With the DASH eating plan, you should limit salt (sodium) intake to 2,300 mg a day. If you have hypertension, you may need to reduce your sodium intake to 1,500 mg a day.  When on the DASH eating plan, aim to eat more fresh fruits and vegetables, whole grains, lean proteins, low-fat dairy, and heart-healthy fats.  Work with your health care provider or diet and nutrition specialist (dietitian) to adjust your eating plan to your individual calorie needs. This information is not intended to replace advice given to you by your health care provider. Make sure you discuss any questions you have with your health care provider. Document Released: 03/05/2011 Document Revised: 02/26/2017 Document Reviewed: 03/09/2016 Elsevier Patient Education  2020 [place], MD [facility]

## 2019-03-07 ENCOUNTER — Other Ambulatory Visit: Payer: Self-pay | Admitting: Internal Medicine

## 2019-03-07 ENCOUNTER — Other Ambulatory Visit: Payer: Self-pay | Admitting: *Deleted

## 2019-03-07 ENCOUNTER — Telehealth: Payer: Self-pay | Admitting: Internal Medicine

## 2019-03-07 DIAGNOSIS — I10 Essential (primary) hypertension: Secondary | ICD-10-CM

## 2019-03-07 MED ORDER — HYDROCHLOROTHIAZIDE 25 MG PO TABS: 25.0000 mg | ORAL_TABLET | Freq: Every day | ORAL | 1 refills | Status: DC

## 2019-03-07 NOTE — Telephone Encounter (Signed)
Rx sent. Sorry for the delay. Not sure what happened.

## 2019-03-07 NOTE — Telephone Encounter (Signed)
hydrochlorothiazide (HYDRODIURIL) 12.5 MG tablet  Pt was in the office on 12/3 and was told that a NEW script of increased dosage was to be called in for the above. 25 MG starting 12/3 Please FU new is to be sent  [facility] [place], [place] - [address] [phone] (Phone) [phone] (Fax)

## 2019-03-07 NOTE — Telephone Encounter (Signed)
Please advise, is it okay to send in new dose.

## 2019-03-08 NOTE — Telephone Encounter (Signed)
Noted  

## 2019-04-13 ENCOUNTER — Ambulatory Visit: Payer: Managed Care, Other (non HMO) | Admitting: Internal Medicine

## 2019-04-19 ENCOUNTER — Other Ambulatory Visit: Payer: Self-pay

## 2019-04-20 ENCOUNTER — Encounter: Payer: Self-pay | Admitting: Internal Medicine

## 2019-04-20 ENCOUNTER — Ambulatory Visit (INDEPENDENT_AMBULATORY_CARE_PROVIDER_SITE_OTHER): Payer: BC Managed Care – PPO | Admitting: Internal Medicine

## 2019-04-20 VITALS — BP 130/80 | HR 88 | Temp 97.6°F | Wt 237.0 lb

## 2019-04-20 DIAGNOSIS — I10 Essential (primary) hypertension: Secondary | ICD-10-CM | POA: Diagnosis not present

## 2019-04-20 NOTE — Progress Notes (Signed)
Established Patient Office Visit     This visit occurred during the SARS-CoV-2 public health emergency.  Safety protocols were in place, including screening questions prior to the visit, additional usage of staff PPE, and extensive cleaning of exam room while observing appropriate contact time as indicated for disinfecting solutions.    CC/Reason for Visit: Blood pressure follow-up  HPI: Mary Reyes is a 58 y.o. female who is coming in today for the above mentioned reasons.  At last visit hydrochlorothiazide was increased from 12-1/2 to 25 mg due to elevated blood pressures and she is here today for follow-up.  At home her machine has been reading in the 130s to 140s.  Blood pressure in office today is 130/80.  Past Medical/Surgical History: Past Medical History:  Diagnosis Date  . Allergy   . Anxiety   . Arthritis   . Blood in stool   . Colon polyp   . Depression   . Eczema   . Frequent headaches   . GERD (gastroesophageal reflux disease)   . Hypertension   . Hypothyroidism   . Migraine   . OSA (obstructive sleep apnea)    CPAP nightly  . Osteopenia   . Sleep apnea   . Thyroid disease   . Trimalleolar fracture of ankle, closed, left, sequela     Past Surgical History:  Procedure Laterality Date  . ABDOMINAL HYSTERECTOMY  2004  . ORIF ANKLE FRACTURE Left 02/12/2016   Procedure: OPEN REDUCTION INTERNAL FIXATION (ORIF) LEFT ANKLE FRACTURE;  Surgeon: Leandrew Koyanagi, MD;  Location: [facility];  Service: Orthopedics;  Laterality: Left;  . WISDOM TOOTH EXTRACTION      Social History:  reports that she has never smoked. She has never used smokeless tobacco. She reports that she does not drink alcohol or use drugs.  Allergies: Allergies  Allergen Reactions  . Adhesive [Tape] Rash  . Benzoyl Peroxide Rash    Benzine   . Milk-Related Compounds Rash    Family History:  Family History  Problem Relation Age of Onset  . Healthy Mother   .  Alcohol abuse Father        Deceased  . Stroke Maternal Grandmother   . Arthritis Paternal Grandmother   . Depression Daughter   . Heart murmur Daughter   . Allergic rhinitis Daughter   . Fibromyalgia Daughter   . Allergic rhinitis Daughter   . Migraines Son   . Allergic rhinitis Son   . Brain cancer Sister   . Asthma Sister   . Angioedema Neg Hx   . Eczema Neg Hx   . Immunodeficiency Neg Hx   . Urticaria Neg Hx   . Colon cancer Neg Hx      Current Outpatient Medications:  .  ALPRAZolam (XANAX) 0.25 MG tablet, Take 0.25 mg by mouth at bedtime as needed for anxiety or sleep. , Disp: , Rfl: 0 .  ARIPiprazole (ABILIFY) 2 MG tablet, Take 2 mg by mouth daily., Disp: , Rfl:  .  Azelastine HCl 0.15 % SOLN, Place into the nose., Disp: , Rfl:  .  buPROPion (WELLBUTRIN XL) 150 MG 24 hr tablet, 300 mg daily. , Disp: , Rfl:  .  calcium-vitamin D (OSCAL WITH D) 500-200 MG-UNIT tablet, Take 1 tablet by mouth 3 (three) times daily., Disp: 90 tablet, Rfl: 12 .  cholecalciferol (VITAMIN D) 1000 units tablet, Take 2,000 Units by mouth at bedtime., Disp: , Rfl:  .  clobetasol ointment (TEMOVATE) 0.05 %,  Apply 1 application topically 2 (two) times daily as needed (for rash)., Disp: , Rfl:  .  cyclobenzaprine (FLEXERIL) 10 MG tablet, Take 10 mg by mouth 3 (three) times daily as needed for muscle spasms. , Disp: , Rfl: 1 .  Desvenlafaxine ER (PRISTIQ) 50 MG TB24, 100 mg daily. , Disp: , Rfl:  .  famotidine-calcium carbonate-magnesium hydroxide (PEPCID COMPLETE) 10-800-165 MG chewable tablet, Chew 1 tablet by mouth daily as needed., Disp: , Rfl:  .  fexofenadine (ALLEGRA ALLERGY) 180 MG tablet, Take by mouth., Disp: , Rfl:  .  fluticasone (FLONASE) 50 MCG/ACT nasal spray, Place into both nostrils daily., Disp: , Rfl:  .  hydrochlorothiazide (HYDRODIURIL) 25 MG tablet, Take 1 tablet (25 mg total) by mouth daily., Disp: 90 tablet, Rfl: 1 .  ibuprofen (ADVIL,MOTRIN) 200 MG tablet, Take 400 mg by mouth every  6 (six) hours as needed for headache, mild pain or moderate pain., Disp: , Rfl:  .  levothyroxine (SYNTHROID) 50 MCG tablet, TAKE 1 TABLET BY MOUTH BEFORE BREAKFAST 6 DAYS PER WEEK AND TAKE 2 TABLETS BY MOUTH ON SUNDAY, Disp: 105 tablet, Rfl: 3 .  liothyronine (CYTOMEL) 5 MCG tablet, TAKE ONE AND ONE-HALF (1 & 1/2) TABLETS BY MOUTH ONE TIME DAILY, Disp: 135 tablet, Rfl: 3 .  phenylephrine (SUDAFED PE) 10 MG TABS tablet, Take 10 mg by mouth every 4 (four) hours as needed (for congestion)., Disp: , Rfl:   Review of Systems:  Constitutional: Denies fever, chills, diaphoresis, appetite change and fatigue.  HEENT: Denies photophobia, eye pain, redness, hearing loss, ear pain, congestion, sore throat, rhinorrhea, sneezing, mouth sores, trouble swallowing, neck pain, neck stiffness and tinnitus.   Respiratory: Denies SOB, DOE, cough, chest tightness,  and wheezing.   Cardiovascular: Denies chest pain, palpitations and leg swelling.  Gastrointestinal: Denies nausea, vomiting, abdominal pain, diarrhea, constipation, blood in stool and abdominal distention.  Genitourinary: Denies dysuria, urgency, frequency, hematuria, flank pain and difficulty urinating.  Endocrine: Denies: hot or cold intolerance, sweats, changes in hair or nails, polyuria, polydipsia. Musculoskeletal: Denies myalgias, back pain, joint swelling, arthralgias and gait problem.  Skin: Denies pallor, rash and wound.  Neurological: Denies dizziness, seizures, syncope, weakness, light-headedness, numbness and headaches.  Hematological: Denies adenopathy. Easy bruising, personal or family bleeding history  Psychiatric/Behavioral: Denies suicidal ideation, mood changes, confusion, nervousness, sleep disturbance and agitation    Physical Exam: Vitals:   04/20/19 0948  BP: 130/80  Pulse: 88  Temp: 97.6 F (36.4 C)  TempSrc: Temporal  SpO2: 99%  Weight: 237 lb (107.5 kg)    Body mass index is 42.32 kg/m.   Constitutional: NAD,  calm, comfortable Eyes: PERRL, lids and conjunctivae normal, wears corrective lenses ENMT: Mucous membranes are moist.  Respiratory: clear to auscultation bilaterally, no wheezing, no crackles. Normal respiratory effort. No accessory muscle use.  Cardiovascular: Regular rate and rhythm, no murmurs / rubs / gallops.  1-2+ pitting edema bilaterally. Neurologic: Grossly intact and nonfocal Psychiatric: Normal judgment and insight. Alert and oriented x 3. Normal mood.    Impression and Plan:  Essential hypertension -Better controlled. -Continue hydrochlorothiazide 25 mg daily. -She will bring in her blood pressure cuff at next visit for calibration.    Patient Instructions  -Nice seeing you today!!  -Schedule follow up for annual physical at your convenience. Please come in fasting that day.     Lelon Frohlich, MD [facility]

## 2019-04-20 NOTE — Patient Instructions (Signed)
-  Nice seeing you today!!  -Schedule follow up for annual physical at your convenience. Please come in fasting that day.

## 2019-06-26 DIAGNOSIS — F419 Anxiety disorder, unspecified: Secondary | ICD-10-CM | POA: Diagnosis not present

## 2019-06-26 DIAGNOSIS — F331 Major depressive disorder, recurrent, moderate: Secondary | ICD-10-CM | POA: Diagnosis not present

## 2019-06-27 DIAGNOSIS — G4733 Obstructive sleep apnea (adult) (pediatric): Secondary | ICD-10-CM | POA: Diagnosis not present

## 2019-07-05 ENCOUNTER — Other Ambulatory Visit: Payer: Self-pay

## 2019-07-06 ENCOUNTER — Other Ambulatory Visit: Payer: Self-pay | Admitting: Internal Medicine

## 2019-07-06 ENCOUNTER — Ambulatory Visit (INDEPENDENT_AMBULATORY_CARE_PROVIDER_SITE_OTHER): Payer: BC Managed Care – PPO | Admitting: Internal Medicine

## 2019-07-06 ENCOUNTER — Encounter: Payer: Self-pay | Admitting: Internal Medicine

## 2019-07-06 VITALS — BP 110/70 | HR 70 | Temp 97.5°F | Ht 63.0 in | Wt 238.1 lb

## 2019-07-06 DIAGNOSIS — K635 Polyp of colon: Secondary | ICD-10-CM

## 2019-07-06 DIAGNOSIS — I10 Essential (primary) hypertension: Secondary | ICD-10-CM | POA: Diagnosis not present

## 2019-07-06 DIAGNOSIS — Z0000 Encounter for general adult medical examination without abnormal findings: Secondary | ICD-10-CM

## 2019-07-06 DIAGNOSIS — K219 Gastro-esophageal reflux disease without esophagitis: Secondary | ICD-10-CM

## 2019-07-06 DIAGNOSIS — E039 Hypothyroidism, unspecified: Secondary | ICD-10-CM

## 2019-07-06 DIAGNOSIS — G4733 Obstructive sleep apnea (adult) (pediatric): Secondary | ICD-10-CM

## 2019-07-06 DIAGNOSIS — Z1231 Encounter for screening mammogram for malignant neoplasm of breast: Secondary | ICD-10-CM | POA: Diagnosis not present

## 2019-07-06 DIAGNOSIS — E559 Vitamin D deficiency, unspecified: Secondary | ICD-10-CM | POA: Diagnosis not present

## 2019-07-06 DIAGNOSIS — F331 Major depressive disorder, recurrent, moderate: Secondary | ICD-10-CM

## 2019-07-06 DIAGNOSIS — E871 Hypo-osmolality and hyponatremia: Secondary | ICD-10-CM | POA: Insufficient documentation

## 2019-07-06 LAB — COMPREHENSIVE METABOLIC PANEL
ALT: 20 U/L (ref 0–35)
AST: 15 U/L (ref 0–37)
Albumin: 4.3 g/dL (ref 3.5–5.2)
Alkaline Phosphatase: 62 U/L (ref 39–117)
BUN: 9 mg/dL (ref 6–23)
CO2: 31 mEq/L (ref 19–32)
Calcium: 9.3 mg/dL (ref 8.4–10.5)
Chloride: 90 mEq/L — ABNORMAL LOW (ref 96–112)
Creatinine, Ser: 0.94 mg/dL (ref 0.40–1.20)
GFR: 61 mL/min (ref >60.00)
Glucose, Bld: 92 mg/dL (ref 70–99)
Potassium: 4.2 mEq/L (ref 3.5–5.1)
Sodium: 128 mEq/L — ABNORMAL LOW (ref 135–145)
Total Bilirubin: 0.5 mg/dL (ref 0.2–1.2)
Total Protein: 6.9 g/dL (ref 6.0–8.3)

## 2019-07-06 LAB — CBC WITH DIFFERENTIAL/PLATELET
Basophils Absolute: 0 10*3/uL (ref 0.0–0.1)
Basophils Relative: 0.7 % (ref 0.0–3.0)
Eosinophils Absolute: 0.1 10*3/uL (ref 0.0–0.7)
Eosinophils Relative: 1.9 % (ref 0.0–5.0)
HCT: 36.8 % (ref 36.0–46.0)
Hemoglobin: 12.8 g/dL (ref 12.0–15.0)
Lymphocytes Relative: 28.8 % (ref 12.0–46.0)
Lymphs Abs: 1.4 10*3/uL (ref 0.7–4.0)
MCHC: 34.6 g/dL (ref 30.0–36.0)
MCV: 83.8 fl (ref 78.0–100.0)
Monocytes Absolute: 0.5 10*3/uL (ref 0.1–1.0)
Monocytes Relative: 9.3 % (ref 3.0–12.0)
Neutro Abs: 2.9 10*3/uL (ref 1.4–7.7)
Neutrophils Relative %: 59.3 % (ref 43.0–77.0)
Platelets: 300 10*3/uL (ref 150.0–400.0)
RBC: 4.39 Mil/uL (ref 3.87–5.11)
RDW: 13.7 % (ref 11.5–15.5)
WBC: 4.8 10*3/uL (ref 4.0–10.5)

## 2019-07-06 LAB — LIPID PANEL
Cholesterol: 161 mg/dL (ref 0–200)
HDL: 58.7 mg/dL (ref >39.00)
LDL Cholesterol: 90 mg/dL (ref 0–99)
NonHDL: 102.77
Total CHOL/HDL Ratio: 3
Triglycerides: 66 mg/dL (ref 0.0–149.0)
VLDL: 13.2 mg/dL (ref 0.0–40.0)

## 2019-07-06 LAB — HEMOGLOBIN A1C: Hgb A1c MFr Bld: 5.5 % (ref 4.6–6.5)

## 2019-07-06 LAB — VITAMIN B12: Vitamin B-12: 756 pg/mL (ref 211–911)

## 2019-07-06 LAB — VITAMIN D 25 HYDROXY (VIT D DEFICIENCY, FRACTURES): VITD: 44.77 ng/mL (ref 30.00–100.00)

## 2019-07-06 LAB — TSH: TSH: 0.99 u[IU]/mL (ref 0.35–4.50)

## 2019-07-06 MED ORDER — CYCLOBENZAPRINE HCL 10 MG PO TABS: 10.0000 mg | ORAL_TABLET | Freq: Three times a day (TID) | ORAL | 1 refills | Status: DC | PRN

## 2019-07-06 MED ORDER — METOPROLOL TARTRATE 25 MG PO TABS: 25.0000 mg | ORAL_TABLET | Freq: Two times a day (BID) | ORAL | 1 refills | Status: DC

## 2019-07-06 NOTE — Progress Notes (Signed)
Established Patient Office Visit     This visit occurred during the SARS-CoV-2 public health emergency.  Safety protocols were in place, including screening questions prior to the visit, additional usage of staff PPE, and extensive cleaning of exam room while observing appropriate contact time as indicated for disinfecting solutions.    CC/Reason for Visit: Annual preventive exam  HPI: Mary Reyes is a 59 y.o. female who is coming in today for the above mentioned reasons. Past Medical History is significant for:   1.  Hypertension that has not been well controlled recently on 12.5 mg of hydrochlorothiazide.  She brings in her blood pressure log and systolics are between 638 and 160.  2.  Obstructive sleep apnea that is well controlled on CPAP.  3.  GERD that is well controlled on famotidine.  5.  Iatrogenic hypothyroidism following radioactive iodine ablation for Graves' disease in 2001.  She is followed by endocrinology, Dr. Cruzita Lederer, and is on both levothyroxine and Cytomel.  6.  Depression which is severe and followed by psychiatry.  She is currently seeing them on a monthly basis for medication adjustments and feels like just recently she has gotten a "good place".  7.  Morbid obesity  8.  Vitamin D deficiency  She also states she has this "weird nerve problem" she has seen multiple neurologists and nobody has been able to figure out her situation.  She will occasionally slur her words and have paresthesias, there also seems to be a familial decrease in pain sensations.   She has routine eye and dental care.  She has received both of her vaccinations.  She does not do much in the way of exercising.  She is due for tetanus and shingles booster.  She is due for a screening mammogram.  She had a colonoscopy in 2018.   Past Medical/Surgical History: Past Medical History:  Diagnosis Date  . Allergy   . Anxiety   . Arthritis   . Blood in stool   . Colon polyp     . Depression   . Eczema   . Frequent headaches   . GERD (gastroesophageal reflux disease)   . Hypertension   . Hypothyroidism   . Migraine   . OSA (obstructive sleep apnea)    CPAP nightly  . Osteopenia   . Sleep apnea   . Thyroid disease   . Trimalleolar fracture of ankle, closed, left, sequela     Past Surgical History:  Procedure Laterality Date  . ABDOMINAL HYSTERECTOMY  2004  . ORIF ANKLE FRACTURE Left 02/12/2016   Procedure: OPEN REDUCTION INTERNAL FIXATION (ORIF) LEFT ANKLE FRACTURE;  Surgeon: Leandrew Koyanagi, MD;  Location: [facility];  Service: Orthopedics;  Laterality: Left;  . WISDOM TOOTH EXTRACTION      Social History:  reports that she has never smoked. She has never used smokeless tobacco. She reports that she does not drink alcohol or use drugs.  Allergies: Allergies  Allergen Reactions  . Adhesive [Tape] Rash  . Benzoyl Peroxide Rash    Benzine   . Milk-Related Compounds Rash    Family History:  Family History  Problem Relation Age of Onset  . Healthy Mother   . Alcohol abuse Father        Deceased  . Stroke Maternal Grandmother   . Arthritis Paternal Grandmother   . Depression Daughter   . Heart murmur Daughter   . Allergic rhinitis Daughter   . Fibromyalgia Daughter   .  Allergic rhinitis Daughter   . Migraines Son   . Allergic rhinitis Son   . Brain cancer Sister   . Asthma Sister   . Angioedema Neg Hx   . Eczema Neg Hx   . Immunodeficiency Neg Hx   . Urticaria Neg Hx   . Colon cancer Neg Hx      Current Outpatient Medications:  .  ALPRAZolam (XANAX) 0.25 MG tablet, Take 0.25 mg by mouth at bedtime as needed for anxiety or sleep. , Disp: , Rfl: 0 .  ARIPiprazole (ABILIFY) 2 MG tablet, Take 2 mg by mouth daily., Disp: , Rfl:  .  Azelastine HCl 0.15 % SOLN, Place into the nose., Disp: , Rfl:  .  buPROPion (WELLBUTRIN XL) 150 MG 24 hr tablet, 300 mg daily. , Disp: , Rfl:  .  calcium-vitamin D (OSCAL WITH D) 500-200 MG-UNIT  tablet, Take 1 tablet by mouth 3 (three) times daily., Disp: 90 tablet, Rfl: 12 .  cholecalciferol (VITAMIN D) 1000 units tablet, Take 2,000 Units by mouth at bedtime., Disp: , Rfl:  .  clobetasol ointment (TEMOVATE) 5.95 %, Apply 1 application topically 2 (two) times daily as needed (for rash)., Disp: , Rfl:  .  cyclobenzaprine (FLEXERIL) 10 MG tablet, Take 1 tablet (10 mg total) by mouth 3 (three) times daily as needed for muscle spasms., Disp: 30 tablet, Rfl: 1 .  Desvenlafaxine ER (PRISTIQ) 50 MG TB24, 100 mg daily. , Disp: , Rfl:  .  famotidine-calcium carbonate-magnesium hydroxide (PEPCID COMPLETE) 10-800-165 MG chewable tablet, Chew 1 tablet by mouth daily as needed., Disp: , Rfl:  .  fexofenadine (ALLEGRA ALLERGY) 180 MG tablet, Take by mouth., Disp: , Rfl:  .  fluticasone (FLONASE) 50 MCG/ACT nasal spray, Place into both nostrils daily., Disp: , Rfl:  .  hydrochlorothiazide (HYDRODIURIL) 25 MG tablet, Take 1 tablet (25 mg total) by mouth daily., Disp: 90 tablet, Rfl: 1 .  ibuprofen (ADVIL,MOTRIN) 200 MG tablet, Take 400 mg by mouth every 6 (six) hours as needed for headache, mild pain or moderate pain., Disp: , Rfl:  .  levothyroxine (SYNTHROID) 50 MCG tablet, TAKE 1 TABLET BY MOUTH BEFORE BREAKFAST 6 DAYS PER WEEK AND TAKE 2 TABLETS BY MOUTH ON SUNDAY, Disp: 105 tablet, Rfl: 3 .  liothyronine (CYTOMEL) 5 MCG tablet, TAKE ONE AND ONE-HALF (1 & 1/2) TABLETS BY MOUTH ONE TIME DAILY, Disp: 135 tablet, Rfl: 3 .  phenylephrine (SUDAFED PE) 10 MG TABS tablet, Take 10 mg by mouth every 4 (four) hours as needed (for congestion)., Disp: , Rfl:   Review of Systems:  Constitutional: Denies fever, chills, diaphoresis, appetite change and fatigue.  HEENT: Denies photophobia, eye pain, redness, hearing loss, ear pain, congestion, sore throat, rhinorrhea, sneezing, mouth sores, trouble swallowing, neck pain, neck stiffness and tinnitus.   Respiratory: Denies SOB, DOE, cough, chest tightness,  and  wheezing.   Cardiovascular: Denies chest pain, palpitations and leg swelling.  Gastrointestinal: Denies nausea, vomiting, abdominal pain, diarrhea, constipation, blood in stool and abdominal distention.  Genitourinary: Denies dysuria, urgency, frequency, hematuria, flank pain and difficulty urinating.  Endocrine: Denies: hot or cold intolerance, sweats, changes in hair or nails, polyuria, polydipsia. Musculoskeletal: Denies myalgias, back pain, joint swelling, arthralgias and gait problem.  Skin: Denies pallor, rash and wound.  Neurological: Denies dizziness, seizures, syncope, weakness, light-headedness, numbness and headaches.  Hematological: Denies adenopathy. Easy bruising, personal or family bleeding history  Psychiatric/Behavioral: Denies suicidal ideation, mood changes, confusion, nervousness, sleep disturbance and agitation  Physical Exam: Vitals:   07/06/19 0859  BP: 110/70  Pulse: 70  Temp: (!) 97.5 F (36.4 C)  TempSrc: Temporal  SpO2: 97%  Weight: 238 lb 1.6 oz (108 kg)  Height: '5\' 3"'  (1.6 m)    Body mass index is 42.18 kg/m.   Constitutional: NAD, calm, comfortable Eyes: PERRL, lids and conjunctivae normal, wears corrective lenses ENMT: Mucous membranes are moist. Tympanic membrane is pearly white, no erythema or bulging. Neck: normal, supple, no masses, no thyromegaly Respiratory: clear to auscultation bilaterally, no wheezing, no crackles. Normal respiratory effort. No accessory muscle use.  Cardiovascular: Regular rate and rhythm, no murmurs / rubs / gallops. No extremity edema. 2+ pedal pulses. No carotid bruits.  Abdomen: no tenderness, no masses palpated. No hepatosplenomegaly. Bowel sounds positive.  Musculoskeletal: no clubbing / cyanosis. No joint deformity upper and lower extremities. Good ROM, no contractures. Normal muscle tone.  Skin: no rashes, lesions, ulcers. No induration Neurologic: CN 2-12 grossly intact. Sensation intact, DTR normal. Strength  5/5 in all 4.  Psychiatric: Normal judgment and insight. Alert and oriented x 3. Normal mood.    Impression and Plan:  Encounter for preventive health examination  -She has routine eye and dental care. -She has received both Covid vaccines, she is due for tetanus booster and shingles series however we will wait 6 weeks out from her last Covid vaccine. -Screening labs today. -Healthy lifestyle has been discussed in detail. -She had a colonoscopy in 2018 and is a 5-year callback.   She has not had a mammogram since 2019, I will request one today. -She follows with GYN for cervical cancer screening.  Encounter for screening mammogram for malignant neoplasm of breast  - Plan: MM Digital Screening  Polyp of colon, unspecified part of colon, unspecified type -Colonoscopy in 2018, 5-year callback.  Essential hypertension  -Well-controlled. -Continue hydrochlorothiazide 25 mg.  Gastroesophageal reflux disease, unspecified whether esophagitis present -Well-controlled on famotidine.  Acquired hypothyroidism  - Plan: TSH, followed by endocrine.  Moderate episode of recurrent major depressive disorder (HCC) -Mood is stable, followed by psychiatry.  Vitamin D deficiency  - Plan: VITAMIN D 25 Hydroxy (Vit-D Deficiency, Fractures)  OSA (obstructive sleep apnea) -On CPAP.  Morbid obesity ([place])  -Discussed healthy lifestyle, including increased physical activity and better food choices to promote weight loss.    Patient Instructions  -Nice seeing you today!!  -Lab work today; will notify you once results are available.  -See you back in 6 months.  -Mammogram requested today.  -Tetanus booster and shingles vaccination series 6 weeks after second COVID vaccine.   Preventive Care 49-49 Years Old, Female Preventive care refers to visits with your health care provider and lifestyle choices that can promote health and wellness. This includes:  A yearly physical exam. This may  also be called an annual well check.  Regular dental visits and eye exams.  Immunizations.  Screening for certain conditions.  Healthy lifestyle choices, such as eating a healthy diet, getting regular exercise, not using drugs or products that contain nicotine and tobacco, and limiting alcohol use. What can I expect for my preventive care visit? Physical exam Your health care provider will check your:  Height and weight. This may be used to calculate body mass index (BMI), which tells if you are at a healthy weight.  Heart rate and blood pressure.  Skin for abnormal spots. Counseling Your health care provider may ask you questions about your:  Alcohol, tobacco, and drug use.  Emotional well-being.  Home and relationship well-being.  Sexual activity.  Eating habits.  Work and work Statistician.  Method of birth control.  Menstrual cycle.  Pregnancy history. What immunizations do I need?  Influenza (flu) vaccine  This is recommended every year. Tetanus, diphtheria, and pertussis (Tdap) vaccine  You may need a Td booster every 10 years. Varicella (chickenpox) vaccine  You may need this if you have not been vaccinated. Zoster (shingles) vaccine  You may need this after age 69. Measles, mumps, and rubella (MMR) vaccine  You may need at least one dose of MMR if you were born in 1957 or later. You may also need a second dose. Pneumococcal conjugate (PCV13) vaccine  You may need this if you have certain conditions and were not previously vaccinated. Pneumococcal polysaccharide (PPSV23) vaccine  You may need one or two doses if you smoke cigarettes or if you have certain conditions. Meningococcal conjugate (MenACWY) vaccine  You may need this if you have certain conditions. Hepatitis A vaccine  You may need this if you have certain conditions or if you travel or work in places where you may be exposed to hepatitis A. Hepatitis B vaccine  You may need this if  you have certain conditions or if you travel or work in places where you may be exposed to hepatitis B. Haemophilus influenzae type b (Hib) vaccine  You may need this if you have certain conditions. Human papillomavirus (HPV) vaccine  If recommended by your health care provider, you may need three doses over 6 months. You may receive vaccines as individual doses or as more than one vaccine together in one shot (combination vaccines). Talk with your health care provider about the risks and benefits of combination vaccines. What tests do I need? Blood tests  Lipid and cholesterol levels. These may be checked every 5 years, or more frequently if you are over 59 years old.  Hepatitis C test.  Hepatitis B test. Screening  Lung cancer screening. You may have this screening every year starting at age 9 if you have a 30-pack-year history of smoking and currently smoke or have quit within the past 15 years.  Colorectal cancer screening. All adults should have this screening starting at age 60 and continuing until age 65. Your health care provider may recommend screening at age 85 if you are at increased risk. You will have tests every 1-10 years, depending on your results and the type of screening test.  Diabetes screening. This is done by checking your blood sugar (glucose) after you have not eaten for a while (fasting). You may have this done every 1-3 years.  Mammogram. This may be done every 1-2 years. Talk with your health care provider about when you should start having regular mammograms. This may depend on whether you have a family history of breast cancer.  BRCA-related cancer screening. This may be done if you have a family history of breast, ovarian, tubal, or peritoneal cancers.  Pelvic exam and Pap test. This may be done every 3 years starting at age 28. Starting at age 69, this may be done every 5 years if you have a Pap test in combination with an HPV test. Other tests  Sexually  transmitted disease (STD) testing.  Bone density scan. This is done to screen for osteoporosis. You may have this scan if you are at high risk for osteoporosis. Follow these instructions at home: Eating and drinking  Eat a diet that includes fresh fruits and vegetables, whole grains, lean  protein, and low-fat dairy.  Take vitamin and mineral supplements as recommended by your health care provider.  Do not drink alcohol if: ? Your health care provider tells you not to drink. ? You are pregnant, may be pregnant, or are planning to become pregnant.  If you drink alcohol: ? Limit how much you have to 0-1 drink a day. ? Be aware of how much alcohol is in your drink. In the [place] one drink equals one 12 oz bottle of beer (355 mL), one 5 oz glass of wine (148 mL), or one 1 oz glass of hard liquor (44 mL). Lifestyle  Take daily care of your teeth and gums.  Stay active. Exercise for at least 30 minutes on 5 or more days each week.  Do not use any products that contain nicotine or tobacco, such as cigarettes, e-cigarettes, and chewing tobacco. If you need help quitting, ask your health care provider.  If you are sexually active, practice safe sex. Use a condom or other form of birth control (contraception) in order to prevent pregnancy and STIs (sexually transmitted infections).  If told by your health care provider, take low-dose aspirin daily starting at age 45. What's next?  Visit your health care provider once a year for a well check visit.  Ask your health care provider how often you should have your eyes and teeth checked.  Stay up to date on all vaccines. This information is not intended to replace advice given to you by your health care provider. Make sure you discuss any questions you have with your health care provider. Document Revised: 11/25/2017 Document Reviewed: 11/25/2017 Elsevier Patient Education  2020 [place], [place]  Primary Care at [facility]

## 2019-07-06 NOTE — Patient Instructions (Signed)
-Nice seeing you today!!  -Lab work today; will notify you once results are available.  -See you back in 6 months.  -Mammogram requested today.  -Tetanus booster and shingles vaccination series 6 weeks after second COVID vaccine.   Preventive Care 10-59 Years Old, Female Preventive care refers to visits with your health care provider and lifestyle choices that can promote health and wellness. This includes:  A yearly physical exam. This may also be called an annual well check.  Regular dental visits and eye exams.  Immunizations.  Screening for certain conditions.  Healthy lifestyle choices, such as eating a healthy diet, getting regular exercise, not using drugs or products that contain nicotine and tobacco, and limiting alcohol use. What can I expect for my preventive care visit? Physical exam Your health care provider will check your:  Height and weight. This may be used to calculate body mass index (BMI), which tells if you are at a healthy weight.  Heart rate and blood pressure.  Skin for abnormal spots. Counseling Your health care provider may ask you questions about your:  Alcohol, tobacco, and drug use.  Emotional well-being.  Home and relationship well-being.  Sexual activity.  Eating habits.  Work and work Statistician.  Method of birth control.  Menstrual cycle.  Pregnancy history. What immunizations do I need?  Influenza (flu) vaccine  This is recommended every year. Tetanus, diphtheria, and pertussis (Tdap) vaccine  You may need a Td booster every 10 years. Varicella (chickenpox) vaccine  You may need this if you have not been vaccinated. Zoster (shingles) vaccine  You may need this after age 60. Measles, mumps, and rubella (MMR) vaccine  You may need at least one dose of MMR if you were born in 1957 or later. You may also need a second dose. Pneumococcal conjugate (PCV13) vaccine  You may need this if you have certain conditions and  were not previously vaccinated. Pneumococcal polysaccharide (PPSV23) vaccine  You may need one or two doses if you smoke cigarettes or if you have certain conditions. Meningococcal conjugate (MenACWY) vaccine  You may need this if you have certain conditions. Hepatitis A vaccine  You may need this if you have certain conditions or if you travel or work in places where you may be exposed to hepatitis A. Hepatitis B vaccine  You may need this if you have certain conditions or if you travel or work in places where you may be exposed to hepatitis B. Haemophilus influenzae type b (Hib) vaccine  You may need this if you have certain conditions. Human papillomavirus (HPV) vaccine  If recommended by your health care provider, you may need three doses over 6 months. You may receive vaccines as individual doses or as more than one vaccine together in one shot (combination vaccines). Talk with your health care provider about the risks and benefits of combination vaccines. What tests do I need? Blood tests  Lipid and cholesterol levels. These may be checked every 5 years, or more frequently if you are over 107 years old.  Hepatitis C test.  Hepatitis B test. Screening  Lung cancer screening. You may have this screening every year starting at age 20 if you have a 30-pack-year history of smoking and currently smoke or have quit within the past 15 years.  Colorectal cancer screening. All adults should have this screening starting at age 2 and continuing until age 66. Your health care provider may recommend screening at age 58 if you are at increased risk. You  will have tests every 1-10 years, depending on your results and the type of screening test.  Diabetes screening. This is done by checking your blood sugar (glucose) after you have not eaten for a while (fasting). You may have this done every 1-3 years.  Mammogram. This may be done every 1-2 years. Talk with your health care provider about  when you should start having regular mammograms. This may depend on whether you have a family history of breast cancer.  BRCA-related cancer screening. This may be done if you have a family history of breast, ovarian, tubal, or peritoneal cancers.  Pelvic exam and Pap test. This may be done every 3 years starting at age 67. Starting at age 11, this may be done every 5 years if you have a Pap test in combination with an HPV test. Other tests  Sexually transmitted disease (STD) testing.  Bone density scan. This is done to screen for osteoporosis. You may have this scan if you are at high risk for osteoporosis. Follow these instructions at home: Eating and drinking  Eat a diet that includes fresh fruits and vegetables, whole grains, lean protein, and low-fat dairy.  Take vitamin and mineral supplements as recommended by your health care provider.  Do not drink alcohol if: ? Your health care provider tells you not to drink. ? You are pregnant, may be pregnant, or are planning to become pregnant.  If you drink alcohol: ? Limit how much you have to 0-1 drink a day. ? Be aware of how much alcohol is in your drink. In the [place] one drink equals one 12 oz bottle of beer (355 mL), one 5 oz glass of wine (148 mL), or one 1 oz glass of hard liquor (44 mL). Lifestyle  Take daily care of your teeth and gums.  Stay active. Exercise for at least 30 minutes on 5 or more days each week.  Do not use any products that contain nicotine or tobacco, such as cigarettes, e-cigarettes, and chewing tobacco. If you need help quitting, ask your health care provider.  If you are sexually active, practice safe sex. Use a condom or other form of birth control (contraception) in order to prevent pregnancy and STIs (sexually transmitted infections).  If told by your health care provider, take low-dose aspirin daily starting at age 73. What's next?  Visit your health care provider once a year for a well check  visit.  Ask your health care provider how often you should have your eyes and teeth checked.  Stay up to date on all vaccines. This information is not intended to replace advice given to you by your health care provider. Make sure you discuss any questions you have with your health care provider. Document Revised: 11/25/2017 Document Reviewed: 11/25/2017 Elsevier Patient Education  2020 [employer].

## 2019-08-15 ENCOUNTER — Other Ambulatory Visit: Payer: Self-pay

## 2019-08-16 ENCOUNTER — Encounter: Payer: Self-pay | Admitting: Internal Medicine

## 2019-08-16 ENCOUNTER — Ambulatory Visit (INDEPENDENT_AMBULATORY_CARE_PROVIDER_SITE_OTHER): Payer: BC Managed Care – PPO | Admitting: Internal Medicine

## 2019-08-16 VITALS — BP 120/70 | HR 53 | Temp 97.7°F | Wt 240.7 lb

## 2019-08-16 DIAGNOSIS — Z9181 History of falling: Secondary | ICD-10-CM | POA: Diagnosis not present

## 2019-08-16 DIAGNOSIS — E871 Hypo-osmolality and hyponatremia: Secondary | ICD-10-CM

## 2019-08-16 DIAGNOSIS — I10 Essential (primary) hypertension: Secondary | ICD-10-CM

## 2019-08-16 LAB — BASIC METABOLIC PANEL
BUN: 18 mg/dL (ref 6–23)
CO2: 28 mEq/L (ref 19–32)
Calcium: 9 mg/dL (ref 8.4–10.5)
Chloride: 101 mEq/L (ref 96–112)
Creatinine, Ser: 0.98 mg/dL (ref 0.40–1.20)
GFR: 58.11 mL/min — ABNORMAL LOW (ref >60.00)
Glucose, Bld: 90 mg/dL (ref 70–99)
Potassium: 4.2 mEq/L (ref 3.5–5.1)
Sodium: 135 mEq/L (ref 135–145)

## 2019-08-16 NOTE — Progress Notes (Signed)
Established Patient Office Visit     This visit occurred during the SARS-CoV-2 public health emergency.  Safety protocols were in place, including screening questions prior to the visit, additional usage of staff PPE, and extensive cleaning of exam room while observing appropriate contact time as indicated for disinfecting solutions.    CC/Reason for Visit: Follow-up blood pressure and hyponatremia  HPI: Mary Reyes is a 59 y.o. female who is coming in today for the above mentioned reasons.  She was seen in April for her physical.  At that time blood pressure was well controlled but labs showed hyponatremia with a sodium of 128.  It was decided at that point to take her off hydrochlorothiazide and transition her over to metoprolol 25 mg twice daily.  She is here today to follow-up on blood pressure and on her sodium levels.  She is also on Pristiq that can cause hyponatremia.  She was advised to contact her psychiatrist in regards to this.  She would like me to send a prescription for a chairlift that she has already had installed in her house as this will make it more affordable for her.   Past Medical/Surgical History: Past Medical History:  Diagnosis Date  . Allergy   . Anxiety   . Arthritis   . Blood in stool   . Colon polyp   . Depression   . Eczema   . Frequent headaches   . GERD (gastroesophageal reflux disease)   . Hypertension   . Hypothyroidism   . Migraine   . OSA (obstructive sleep apnea)    CPAP nightly  . Osteopenia   . Sleep apnea   . Thyroid disease   . Trimalleolar fracture of ankle, closed, left, sequela     Past Surgical History:  Procedure Laterality Date  . ABDOMINAL HYSTERECTOMY  2004  . ORIF ANKLE FRACTURE Left 02/12/2016   Procedure: OPEN REDUCTION INTERNAL FIXATION (ORIF) LEFT ANKLE FRACTURE;  Surgeon: Leandrew Koyanagi, MD;  Location: [place];  Service: Orthopedics;  Laterality: Left;  . WISDOM TOOTH EXTRACTION       Social History:  reports that she has never smoked. She has never used smokeless tobacco. She reports that she does not drink alcohol or use drugs.  Allergies: Allergies  Allergen Reactions  . Adhesive [Tape] Rash  . Benzoyl Peroxide Rash    Benzine   . Milk-Related Compounds Rash    Family History:  Family History  Problem Relation Age of Onset  . Healthy Mother   . Alcohol abuse Father        Deceased  . Stroke Maternal Grandmother   . Arthritis Paternal Grandmother   . Depression Daughter   . Heart murmur Daughter   . Allergic rhinitis Daughter   . Fibromyalgia Daughter   . Allergic rhinitis Daughter   . Migraines Son   . Allergic rhinitis Son   . Brain cancer Sister   . Asthma Sister   . Angioedema Neg Hx   . Eczema Neg Hx   . Immunodeficiency Neg Hx   . Urticaria Neg Hx   . Colon cancer Neg Hx      Current Outpatient Medications:  .  ALPRAZolam (XANAX) 0.25 MG tablet, Take 0.25 mg by mouth at bedtime as needed for anxiety or sleep. , Disp: , Rfl: 0 .  ARIPiprazole (ABILIFY) 2 MG tablet, Take 2 mg by mouth daily., Disp: , Rfl:  .  Azelastine HCl 0.15 % SOLN, Place  into the nose., Disp: , Rfl:  .  buPROPion (WELLBUTRIN XL) 150 MG 24 hr tablet, 300 mg daily. , Disp: , Rfl:  .  calcium-vitamin D (OSCAL WITH D) 500-200 MG-UNIT tablet, Take 1 tablet by mouth 3 (three) times daily., Disp: 90 tablet, Rfl: 12 .  cholecalciferol (VITAMIN D) 1000 units tablet, Take 2,000 Units by mouth at bedtime., Disp: , Rfl:  .  clobetasol ointment (TEMOVATE) AB-123456789 %, Apply 1 application topically 2 (two) times daily as needed (for rash)., Disp: , Rfl:  .  cyclobenzaprine (FLEXERIL) 10 MG tablet, Take 1 tablet (10 mg total) by mouth 3 (three) times daily as needed for muscle spasms., Disp: 30 tablet, Rfl: 1 .  Desvenlafaxine ER (PRISTIQ) 50 MG TB24, 100 mg daily. , Disp: , Rfl:  .  famotidine-calcium carbonate-magnesium hydroxide (PEPCID COMPLETE) 10-800-165 MG chewable tablet, Chew 1  tablet by mouth daily as needed., Disp: , Rfl:  .  fexofenadine (ALLEGRA ALLERGY) 180 MG tablet, Take by mouth., Disp: , Rfl:  .  fluticasone (FLONASE) 50 MCG/ACT nasal spray, Place into both nostrils daily., Disp: , Rfl:  .  ibuprofen (ADVIL,MOTRIN) 200 MG tablet, Take 400 mg by mouth every 6 (six) hours as needed for headache, mild pain or moderate pain., Disp: , Rfl:  .  levothyroxine (SYNTHROID) 50 MCG tablet, TAKE 1 TABLET BY MOUTH BEFORE BREAKFAST 6 DAYS PER WEEK AND TAKE 2 TABLETS BY MOUTH ON SUNDAY, Disp: 105 tablet, Rfl: 3 .  liothyronine (CYTOMEL) 5 MCG tablet, TAKE ONE AND ONE-HALF (1 & 1/2) TABLETS BY MOUTH ONE TIME DAILY, Disp: 135 tablet, Rfl: 3 .  metoprolol tartrate (LOPRESSOR) 25 MG tablet, Take 1 tablet (25 mg total) by mouth 2 (two) times daily., Disp: 180 tablet, Rfl: 1 .  phenylephrine (SUDAFED PE) 10 MG TABS tablet, Take 10 mg by mouth every 4 (four) hours as needed (for congestion)., Disp: , Rfl:   Review of Systems:  Constitutional: Denies fever, chills, diaphoresis, appetite change and fatigue.  HEENT: Denies photophobia, eye pain, redness, hearing loss, ear pain, congestion, sore throat, rhinorrhea, sneezing, mouth sores, trouble swallowing, neck pain, neck stiffness and tinnitus.   Respiratory: Denies SOB, DOE, cough, chest tightness,  and wheezing.   Cardiovascular: Denies chest pain, palpitations and leg swelling.  Gastrointestinal: Denies nausea, vomiting, abdominal pain, diarrhea, constipation, blood in stool and abdominal distention.  Genitourinary: Denies dysuria, urgency, frequency, hematuria, flank pain and difficulty urinating.  Endocrine: Denies: hot or cold intolerance, sweats, changes in hair or nails, polyuria, polydipsia. Musculoskeletal: Denies myalgias, back pain, joint swelling, arthralgias and gait problem.  Skin: Denies pallor, rash and wound.  Neurological: Denies dizziness, seizures, syncope, weakness, light-headedness, numbness and headaches.    Hematological: Denies adenopathy. Easy bruising, personal or family bleeding history  Psychiatric/Behavioral: Denies suicidal ideation, mood changes, confusion, nervousness, sleep disturbance and agitation    Physical Exam: Vitals:   08/16/19 1032  BP: 120/70  Pulse: (!) 53  Temp: 97.7 F (36.5 C)  TempSrc: Temporal  SpO2: 96%  Weight: 240 lb 11.2 oz (109.2 kg)    Body mass index is 42.64 kg/m.   Constitutional: NAD, calm, comfortable Eyes: PERRL, lids and conjunctivae normal, wears corrective lenses ENMT: Mucous membranes are moist.  Respiratory: clear to auscultation bilaterally, no wheezing, no crackles. Normal respiratory effort. No accessory muscle use.  Cardiovascular: Regular rate and rhythm, no murmurs / rubs / gallops. No extremity edema.  Neurologic: Grossly intact and nonfocal. Psychiatric: Normal judgment and insight. Alert and oriented x  3. Normal mood.    Impression and Plan:  Hyponatremia  - Plan: Basic metabolic panel -Has been advised she follow-up with her psychiatrist if sodium level is still decreased today to see if there is a substitute for her Pristiq.  Risk for falls  - Plan: Ambulatory Referral for DME  Essential hypertension  -Well-controlled on metoprolol.    Patient Instructions  -Nice seeing you today!!  -Lab work today; will notify you once results are available.       Lelon Frohlich, MD [facility]

## 2019-08-16 NOTE — Patient Instructions (Signed)
-  Nice seeing you today!!  -Lab work today; will notify you once results are available.

## 2019-08-16 NOTE — Addendum Note (Signed)
Addended by: Denna Haggard K on: 08/16/2019 11:15 AM   Modules accepted: Orders

## 2019-08-17 ENCOUNTER — Ambulatory Visit
Admission: RE | Admit: 2019-08-17 | Discharge: 2019-08-17 | Disposition: A | Discharge status: Home / Self Care | Payer: BC Managed Care – PPO | Source: Ambulatory Visit | Attending: Internal Medicine | Admitting: Internal Medicine

## 2019-08-17 ENCOUNTER — Other Ambulatory Visit: Payer: Self-pay

## 2019-08-17 ENCOUNTER — Other Ambulatory Visit: Payer: Self-pay | Admitting: Internal Medicine

## 2019-08-17 DIAGNOSIS — M8580 Other specified disorders of bone density and structure, unspecified site: Secondary | ICD-10-CM

## 2019-08-17 DIAGNOSIS — Z1231 Encounter for screening mammogram for malignant neoplasm of breast: Secondary | ICD-10-CM

## 2019-08-21 ENCOUNTER — Telehealth: Payer: Self-pay | Admitting: Internal Medicine

## 2019-08-21 DIAGNOSIS — Z9181 History of falling: Secondary | ICD-10-CM

## 2019-08-21 NOTE — Telephone Encounter (Signed)
Pt stated she received a lift chair and want she is actually needing is a Stair lift instead. Pt is wondering if it can be called in?    Pt can be reached at [phone]

## 2019-08-22 NOTE — Telephone Encounter (Signed)
Okay for a different order?

## 2019-08-22 NOTE — Addendum Note (Signed)
Addended by: Westley Hummer B on: 08/22/2019 02:25 PM   Modules accepted: Orders

## 2019-08-22 NOTE — Telephone Encounter (Signed)
Order ready for pick up and patient is aware.

## 2019-08-22 NOTE — Telephone Encounter (Signed)
Yes

## 2019-08-27 ENCOUNTER — Other Ambulatory Visit: Payer: Self-pay | Admitting: Internal Medicine

## 2019-08-27 DIAGNOSIS — I10 Essential (primary) hypertension: Secondary | ICD-10-CM

## 2019-09-27 DIAGNOSIS — G4733 Obstructive sleep apnea (adult) (pediatric): Secondary | ICD-10-CM | POA: Diagnosis not present

## 2019-10-19 ENCOUNTER — Ambulatory Visit
Admission: RE | Admit: 2019-10-19 | Discharge: 2019-10-19 | Disposition: A | Discharge status: Home / Self Care | Payer: BC Managed Care – PPO | Source: Ambulatory Visit | Attending: Internal Medicine | Admitting: Internal Medicine

## 2019-10-19 ENCOUNTER — Ambulatory Visit (INDEPENDENT_AMBULATORY_CARE_PROVIDER_SITE_OTHER): Payer: Self-pay | Admitting: Internal Medicine

## 2019-10-19 ENCOUNTER — Other Ambulatory Visit: Payer: Self-pay

## 2019-10-19 ENCOUNTER — Encounter: Payer: Self-pay | Admitting: Internal Medicine

## 2019-10-19 VITALS — BP 120/70 | HR 59 | Ht 63.0 in | Wt 239.0 lb

## 2019-10-19 DIAGNOSIS — E7212 Methylenetetrahydrofolate reductase deficiency: Secondary | ICD-10-CM

## 2019-10-19 DIAGNOSIS — M8580 Other specified disorders of bone density and structure, unspecified site: Secondary | ICD-10-CM

## 2019-10-19 DIAGNOSIS — M899 Disorder of bone, unspecified: Secondary | ICD-10-CM

## 2019-10-19 DIAGNOSIS — E039 Hypothyroidism, unspecified: Secondary | ICD-10-CM

## 2019-10-19 DIAGNOSIS — E559 Vitamin D deficiency, unspecified: Secondary | ICD-10-CM

## 2019-10-19 DIAGNOSIS — Z1589 Genetic susceptibility to other disease: Secondary | ICD-10-CM

## 2019-10-19 DIAGNOSIS — M949 Disorder of cartilage, unspecified: Secondary | ICD-10-CM

## 2019-10-19 NOTE — Progress Notes (Addendum)
Patient ID: Mary Reyes, female   DOB: 30-Dec-1960, 59 y.o.   MRN: [id number]  This visit occurred during the SARS-CoV-2 public health emergency.  Safety protocols were in place, including screening questions prior to the visit, additional usage of staff PPE, and extensive cleaning of exam room while observing appropriate contact time as indicated for disinfecting solutions.   HPI  Mary Reyes is a 58 y.o.-year-old female, returning for f/u for hypothyroidism, vitamin D deficiency and osteopenia. Last visit 1 year ago.  Since last visit, she was found to have hyponatremia (as low as 128) while on HCTZ and Pristiq.  HCTZ was stopped but continues on Pristiq.  Latest sodium level normalized 07/2019.  Hypothyroidism: Reviewed history: She tells me that her thyroid problem started when she found a "lump in her neck" in 2001. She saw ENT at that time, who was following the nodule, which kept growing. She remembers that she was then dx with Mononucleosis. She had a FNA of the nodule and this apparently returned positive for lymphoid tissue. It was considered that this was an enlarged lymph node possibly from mononucleosis. She did not have a thyroid U/S after this episode. She remembers that the nodule did decrease in size after prednisone.  Patient was found to be hyperthyroid during the investigation for her enlarged lymph node. She initially tried oral anti-thyroid medicines, which were not effective. She had RAI tx in 2002. She did not have the start LT4 after her RAI Tx, but became hypothyroid and had to start levothyroxine in 2008. She is not sure why she was recommended to add LT3, but she believes that it was added because of her history of depression.   She is on levothyroxine 50 mcg 6 out of 7 days and 100 mcg 1 out of 7 days + liothyronine 7.5 mcg daily: - in am - fasting - at least 30 min from b'fast - + Ca, PPIs later in the day - no Fe, MVIs - not on Biotin  Reviewed her  TFTs: Lab Results  Component Value Date   TSH 0.99 07/06/2019   TSH 1.40 10/20/2018   TSH 1.71 10/26/2017   TSH 1.42 09/08/2016   TSH 0.89 02/03/2016   FREET4 0.64 10/20/2018   FREET4 0.66 10/26/2017   FREET4 0.72 09/08/2016   FREET4 0.84 02/03/2016   FREET4 0.70 01/16/2015  04/03/2014: TSH 0.92, fT4 0.97, fT3 2.8  Pt denies: - feeling nodules in neck - hoarseness - dysphagia - choking - SOB with lying down  She has + FH of thyroid disorders in: thyroiditis. No FH of thyroid cancer. + h/o radiation tx to head or neck.  No herbal supplements. No recent steroids use.   Vitamin D deficiency: Previously on ergocalciferol, now on 200 units vitamin D3 daily  Reviewed vitamin D levels: Lab Results  Component Value Date   VD25OH 44.77 07/06/2019   VD25OH 47.21 10/20/2018   VD25OH 43.35 10/26/2017   VD25OH 43.80 11/03/2016   VD25OH 42.23 02/03/2016   VD25OH 45.34 07/20/2014   Osteopenia:  Reviewed the available DXA scan reports: - DXA scan 11/10/2016: L1-L4: T -1.7 RFN: T -1.1 LFN: T -1.0  FRAX MOF risk: 9.4%, Hip fx risk 0.5% - DXA scan 09/05/2014: L1-L4: T -1.6 RFN: T -1.3 LFN: T -1.0 FRAX MOF risk: 5.1%, Hip fx risk 0.3% -She had a left ankle fracture in 01/2016.  She had to have surgery for this.  No fractures since then. -No previous antiresorptive therapy.  She had  dysesthesia and I checked her for MTHFR mutation in the past.  This returned positive for:  Component     Latest Ref Rng & Units 12/11/2015  MTHFR      C677T Single mutation (C677T) identified  Vitamin B12     211 - 911 pg/mL 447  Methylmalonic Acid, Quant     87 - 318 nmol/L 111  Vitamin B12 and MMA normal.  Pt is heterozygous for the C667T MTHFR mutation >> this is associated with an approximately 30% decrease in methylation activity.  After the results returned, I called and discussed with the geneticist at [facility], recommended to check a homocystine. If this is high, this could be treated,  but otherwise, no intervention is necessary.  Homocystein was high, so we discussed about taking her methylated vitamin B consistently.  She was off of these for the last week. Component     Latest Ref Rng & Units 11/03/2016 10/26/2017 10/20/2018  Homocysteine     <10.4 umol/L 10.7 (H) 11.4 (H) 11.6 (H)   Previous endocrinologist Dr. Abel Presto [phone] - in [place]. She also has a history of ?MS, GERD, HTN, OSA, migraines. Father had Lieber's ds. (adult onset blindness in men) Her younger sister has Lewy body dementia. She is doing reasonably well.  ROS: Constitutional: no weight gain/no weight loss, + fatigue, no subjective hyperthermia, no subjective hypothermia Eyes: no blurry vision, no xerophthalmia ENT: no sore throat, + see HPI Cardiovascular: no CP/no SOB/no palpitations/no leg swelling Respiratory: no cough/no SOB/no wheezing Gastrointestinal: no N/no V/no D/no C/+ acid reflux Musculoskeletal: + muscle aches/no joint aches Skin: no rashes, no hair loss Neurological: no tremors/no numbness/no tingling/no dizziness  I reviewed pt's medications, allergies, PMH, social hx, family hx, and changes were documented in the history of present illness. Otherwise, unchanged from my initial visit note.  Past Medical History:  Diagnosis Date  . Allergy   . Anxiety   . Arthritis   . Blood in stool   . Colon polyp   . Depression   . Eczema   . Frequent headaches   . GERD (gastroesophageal reflux disease)   . Hypertension   . Hypothyroidism   . Migraine   . OSA (obstructive sleep apnea)    CPAP nightly  . Osteopenia   . Sleep apnea   . Thyroid disease   . Trimalleolar fracture of ankle, closed, left, sequela    Past Surgical History:  Procedure Laterality Date  . ABDOMINAL HYSTERECTOMY  2004  . ORIF ANKLE FRACTURE Left 02/12/2016   Procedure: OPEN REDUCTION INTERNAL FIXATION (ORIF) LEFT ANKLE FRACTURE;  Surgeon: Leandrew Koyanagi, MD;  Location: [place];   Service: Orthopedics;  Laterality: Left;  . WISDOM TOOTH EXTRACTION     History   Social History  . Marital Status: Married    Spouse Name: N/A  . Number of Children:  3    Occupational History  . N/a   Social History Main Topics  . Smoking status: Never Smoker   . Smokeless tobacco: Not on file  . Alcohol Use: No  . Drug Use: No   Current Outpatient Medications on File Prior to Visit  Medication Sig Dispense Refill  . ALPRAZolam (XANAX) 0.25 MG tablet Take 0.25 mg by mouth at bedtime as needed for anxiety or sleep.   0  . ARIPiprazole (ABILIFY) 2 MG tablet Take 2 mg by mouth daily.    . Azelastine HCl 0.15 % SOLN Place into the nose.    Marland Kitchen  buPROPion (WELLBUTRIN XL) 150 MG 24 hr tablet 300 mg daily.     . calcium-vitamin D (OSCAL WITH D) 500-200 MG-UNIT tablet Take 1 tablet by mouth 3 (three) times daily. 90 tablet 12  . cholecalciferol (VITAMIN D) 1000 units tablet Take 2,000 Units by mouth at bedtime.    . clobetasol ointment (TEMOVATE) 5.62 % Apply 1 application topically 2 (two) times daily as needed (for rash).    . cyclobenzaprine (FLEXERIL) 10 MG tablet Take 1 tablet (10 mg total) by mouth 3 (three) times daily as needed for muscle spasms. 30 tablet 1  . Desvenlafaxine ER (PRISTIQ) 50 MG TB24 100 mg daily.     . famotidine-calcium carbonate-magnesium hydroxide (PEPCID COMPLETE) 10-800-165 MG chewable tablet Chew 1 tablet by mouth daily as needed.    . fexofenadine (ALLEGRA ALLERGY) 180 MG tablet Take by mouth.    . fluticasone (FLONASE) 50 MCG/ACT nasal spray Place into both nostrils daily.    . hydrochlorothiazide (HYDRODIURIL) 25 MG tablet TAKE 1 TABLET BY MOUTH EVERY DAY 90 tablet 1  . ibuprofen (ADVIL,MOTRIN) 200 MG tablet Take 400 mg by mouth every 6 (six) hours as needed for headache, mild pain or moderate pain.    Marland Kitchen levothyroxine (SYNTHROID) 50 MCG tablet TAKE 1 TABLET BY MOUTH BEFORE BREAKFAST 6 DAYS PER WEEK AND TAKE 2 TABLETS BY MOUTH ON SUNDAY 105 tablet 3  .  liothyronine (CYTOMEL) 5 MCG tablet TAKE ONE AND ONE-HALF (1 & 1/2) TABLETS BY MOUTH ONE TIME DAILY 135 tablet 3  . metoprolol tartrate (LOPRESSOR) 25 MG tablet Take 1 tablet (25 mg total) by mouth 2 (two) times daily. 180 tablet 1  . phenylephrine (SUDAFED PE) 10 MG TABS tablet Take 10 mg by mouth every 4 (four) hours as needed (for congestion).     No current facility-administered medications on file prior to visit.   Allergies  Allergen Reactions  . Adhesive [Tape] Rash  . Benzoyl Peroxide Rash    Benzine   . Milk-Related Compounds Rash   Family History  Problem Relation Age of Onset  . Healthy Mother   . Alcohol abuse Father        Deceased  . Stroke Maternal Grandmother   . Arthritis Paternal Grandmother   . Depression Daughter   . Heart murmur Daughter   . Allergic rhinitis Daughter   . Fibromyalgia Daughter   . Allergic rhinitis Daughter   . Migraines Son   . Allergic rhinitis Son   . Brain cancer Sister   . Asthma Sister   . Angioedema Neg Hx   . Eczema Neg Hx   . Immunodeficiency Neg Hx   . Urticaria Neg Hx   . Colon cancer Neg Hx   + see HPI Also:  Sisters with hypertension  Child with PDA  2 daughters with history of thyroiditis  Sister with brain tumor  PE: BP 120/70   Pulse 59   Ht 5\' 3"  (1.6 m)   Wt (!) 239 lb (108.4 kg)   SpO2 95%   BMI 42.34 kg/m  Body mass index is 42.34 kg/m. Wt Readings from Last 3 Encounters:  10/19/19 (!) 239 lb (108.4 kg)  08/16/19 240 lb 11.2 oz (109.2 kg)  07/06/19 238 lb 1.6 oz (108 kg)   Constitutional: overweight, in NAD Eyes: PERRLA, EOMI, no exophthalmos ENT: moist mucous membranes, no thyromegaly, no cervical lymphadenopathy Cardiovascular: RRR, No MRG Respiratory: CTA B Gastrointestinal: abdomen soft, NT, ND, BS+ Musculoskeletal: no deformities, strength intact in all 4  Skin: moist, warm, no rashes Neurological: no tremor with outstretched hands, DTR normal in all 4  ASSESSMENT: 1.  Hypothyroidism  2. Vitamin D deficiency  3. Osteopenia  4. MTHFR heterozygosity - C677T  PLAN:  1. Hypothyroidism -Patient with longstanding hypothyroidism, on levothyroxine and liothyronine therapy. - latest thyroid labs reviewed with pt >> normal: Lab Results  Component Value Date   TSH 0.99 07/06/2019   - she continues on LT4 50 mcg 6/7 days and 100 1/7 days and LT3 7.5 mcg daily  - pt feels good on this dose.  She does complain of memory loss, which is unlikely to be related to her controlled hypothyroidism. - we discussed about taking the thyroid hormone every day, with water, >30 minutes before breakfast, separated by >4 hours from acid reflux medications, calcium, iron, multivitamins. Pt. is taking it correctly.  2. Vitamin D deficiency -Continue 2000 units vitamin D3 daily -At last visit, vitamin D level was normal in 06/2019 -We'll recheck her level at next visit  3. Osteopenia -She had an ankle fracture in 2017 -Reviewed latest DXA scan results from 11/10/2016: Osteopenia but stable T-scores -She is due for another DXA scan >> ordered at [facility]  4. MTHFR heterozygosity -In the past, including at last visit, her homocysteine levels were high and I advised her to take methylated vitamin B consistently -She did start to take them consistently, however, she missed the last week in preparation for a DXA scan which was actually not checked today since she went to the wrong place.  We will restart her methylated vitamins and advised her to come back for a homocystine level in approximately 3 weeks.  Orders Placed This Encounter  Procedures  . DG Bone Density  . Homocysteine   10/29/2019 ([place]) Lumbar spine L1-L4 Femoral neck (FN) 33% distal radius  T-score -1.5 RFN: -1.7 LFN: -1.5 n/a  Change in BMD from previous DXA test (%) Up 3.5% Down 9.1% n/a  (*) statistically significant  Significant decrease in T-scores at the level of the femoral necks.  Will  encourage walking and also other weightbearing exercises.  I will send her a list of the National osteoporosis foundation recommendations for weightbearing exercise to improve bone density.  Philemon Kingdom, MD PhD [facility]

## 2019-10-19 NOTE — Patient Instructions (Signed)
Please come back for labs in 3 weeks.  Please schedule a new bone density.  Please come back for a follow-up appointment in 1 year.

## 2019-10-22 ENCOUNTER — Other Ambulatory Visit: Payer: Self-pay | Admitting: Internal Medicine

## 2019-10-24 ENCOUNTER — Ambulatory Visit (INDEPENDENT_AMBULATORY_CARE_PROVIDER_SITE_OTHER)
Admission: RE | Admit: 2019-10-24 | Discharge: 2019-10-24 | Disposition: A | Discharge status: Home / Self Care | Payer: BC Managed Care – PPO | Source: Ambulatory Visit | Attending: Internal Medicine | Admitting: Internal Medicine

## 2019-10-24 DIAGNOSIS — M949 Disorder of cartilage, unspecified: Secondary | ICD-10-CM | POA: Diagnosis not present

## 2019-10-24 DIAGNOSIS — M8580 Other specified disorders of bone density and structure, unspecified site: Secondary | ICD-10-CM | POA: Diagnosis not present

## 2019-10-24 DIAGNOSIS — M899 Disorder of bone, unspecified: Secondary | ICD-10-CM | POA: Diagnosis not present

## 2019-10-29 DIAGNOSIS — M8580 Other specified disorders of bone density and structure, unspecified site: Secondary | ICD-10-CM | POA: Diagnosis not present

## 2019-10-29 DIAGNOSIS — M949 Disorder of cartilage, unspecified: Secondary | ICD-10-CM | POA: Diagnosis not present

## 2019-10-29 DIAGNOSIS — M899 Disorder of bone, unspecified: Secondary | ICD-10-CM | POA: Diagnosis not present

## 2019-11-28 ENCOUNTER — Other Ambulatory Visit (INDEPENDENT_AMBULATORY_CARE_PROVIDER_SITE_OTHER): Payer: BC Managed Care – PPO

## 2019-11-28 ENCOUNTER — Other Ambulatory Visit: Payer: Self-pay

## 2019-11-28 DIAGNOSIS — E7212 Methylenetetrahydrofolate reductase deficiency: Secondary | ICD-10-CM

## 2019-11-28 DIAGNOSIS — Z1589 Genetic susceptibility to other disease: Secondary | ICD-10-CM

## 2019-11-28 DIAGNOSIS — I10 Essential (primary) hypertension: Secondary | ICD-10-CM | POA: Diagnosis not present

## 2019-11-29 LAB — HOMOCYSTEINE: Homocysteine: 7.7 umol/L (ref <10.4)

## 2019-12-19 DIAGNOSIS — Z961 Presence of intraocular lens: Secondary | ICD-10-CM | POA: Diagnosis not present

## 2019-12-28 DIAGNOSIS — G4733 Obstructive sleep apnea (adult) (pediatric): Secondary | ICD-10-CM | POA: Diagnosis not present

## 2020-01-02 DIAGNOSIS — F419 Anxiety disorder, unspecified: Secondary | ICD-10-CM | POA: Diagnosis not present

## 2020-01-02 DIAGNOSIS — F331 Major depressive disorder, recurrent, moderate: Secondary | ICD-10-CM | POA: Diagnosis not present

## 2020-01-06 ENCOUNTER — Other Ambulatory Visit: Payer: Self-pay | Admitting: Internal Medicine

## 2020-01-06 DIAGNOSIS — I10 Essential (primary) hypertension: Secondary | ICD-10-CM

## 2020-01-08 DIAGNOSIS — F331 Major depressive disorder, recurrent, moderate: Secondary | ICD-10-CM | POA: Diagnosis not present

## 2020-01-09 ENCOUNTER — Ambulatory Visit: Payer: BC Managed Care – PPO | Admitting: Internal Medicine

## 2020-01-10 ENCOUNTER — Encounter: Payer: Self-pay | Admitting: Internal Medicine

## 2020-01-10 ENCOUNTER — Ambulatory Visit (INDEPENDENT_AMBULATORY_CARE_PROVIDER_SITE_OTHER): Payer: BC Managed Care – PPO | Admitting: Internal Medicine

## 2020-01-10 ENCOUNTER — Other Ambulatory Visit: Payer: Self-pay

## 2020-01-10 VITALS — BP 138/85 | HR 55 | Temp 98.6°F | Resp 20 | Ht 63.0 in | Wt 241.6 lb

## 2020-01-10 DIAGNOSIS — E871 Hypo-osmolality and hyponatremia: Secondary | ICD-10-CM

## 2020-01-10 DIAGNOSIS — E039 Hypothyroidism, unspecified: Secondary | ICD-10-CM

## 2020-01-10 DIAGNOSIS — R109 Unspecified abdominal pain: Secondary | ICD-10-CM

## 2020-01-10 DIAGNOSIS — E559 Vitamin D deficiency, unspecified: Secondary | ICD-10-CM | POA: Diagnosis not present

## 2020-01-10 DIAGNOSIS — I10 Essential (primary) hypertension: Secondary | ICD-10-CM

## 2020-01-10 DIAGNOSIS — F331 Major depressive disorder, recurrent, moderate: Secondary | ICD-10-CM

## 2020-01-10 DIAGNOSIS — Z23 Encounter for immunization: Secondary | ICD-10-CM | POA: Diagnosis not present

## 2020-01-10 LAB — POCT URINALYSIS DIPSTICK
Bilirubin, UA: NEGATIVE
Glucose, UA: NEGATIVE
Ketones, UA: NEGATIVE
Leukocytes, UA: NEGATIVE
Nitrite, UA: NEGATIVE
Protein, UA: NEGATIVE
Spec Grav, UA: 1.015 (ref 1.010–1.025)
Urobilinogen, UA: 0.2 E.U./dL
pH, UA: 6 (ref 5.0–8.0)

## 2020-01-10 NOTE — Addendum Note (Signed)
Addended by: Marrion Coy on: 01/10/2020 02:36 PM   Modules accepted: Orders

## 2020-01-10 NOTE — Progress Notes (Signed)
Established Patient Office Visit     This visit occurred during the SARS-CoV-2 public health emergency.  Safety protocols were in place, including screening questions prior to the visit, additional usage of staff PPE, and extensive cleaning of exam room while observing appropriate contact time as indicated for disinfecting solutions.    CC/Reason for Visit: Discuss an acute concern and follow-up chronic conditions  HPI: Mary Reyes is a 59 y.o. female who is coming in today for the above mentioned reasons. Past Medical History is significant for:   1. Hypertension that has been well controlled recently.  2. Obstructive sleep apnea that is well controlled on CPAP.  3. GERD that is well controlled on famotidine.  5. Iatrogenic hypothyroidism following radioactive iodine ablation for Graves' disease in 2001. She is followed by endocrinology, Dr. Izell [place] is on both levothyroxine and Cytomel.  6. Depression which is severe and followed by psychiatry. She is currently seeing them on a monthly basis for medication adjustments and feels like just recently she has gotten a "good place".  7. Morbid obesity  8. Vitamin D deficiency  She was found to have hyponatremia 6 months ago and was taken off hydrochlorothiazide with improvement.  Decision was made to keep her on Pristiq.  She follows with her psychiatrist routinely, she continues to have depressed mood.  She is complaining today of right-sided flank pain that is intermittent and has been ongoing for the past 2 months, other than this no urinary symptoms such as dysuria, increased frequency.  She is requesting a flu vaccine today.  Past Medical/Surgical History: Past Medical History:  Diagnosis Date  . Allergy   . Anxiety   . Arthritis   . Blood in stool   . Colon polyp   . Depression   . Eczema   . Frequent headaches   . GERD (gastroesophageal reflux disease)   . Hypertension   . Hypothyroidism     . Migraine   . OSA (obstructive sleep apnea)    CPAP nightly  . Osteopenia   . Sleep apnea   . Thyroid disease   . Trimalleolar fracture of ankle, closed, left, sequela     Past Surgical History:  Procedure Laterality Date  . ABDOMINAL HYSTERECTOMY  2004  . ORIF ANKLE FRACTURE Left 02/12/2016   Procedure: OPEN REDUCTION INTERNAL FIXATION (ORIF) LEFT ANKLE FRACTURE;  Surgeon: Leandrew Koyanagi, MD;  Location: [facility];  Service: Orthopedics;  Laterality: Left;  . WISDOM TOOTH EXTRACTION      Social History:  reports that she has never smoked. She has never used smokeless tobacco. She reports that she does not drink alcohol and does not use drugs.  Allergies: Allergies  Allergen Reactions  . Adhesive [Tape] Rash  . Benzoyl Peroxide Rash    Benzine   . Milk-Related Compounds Rash    Family History:  Family History  Problem Relation Age of Onset  . Healthy Mother   . Alcohol abuse Father        Deceased  . Stroke Maternal Grandmother   . Arthritis Paternal Grandmother   . Depression Daughter   . Heart murmur Daughter   . Allergic rhinitis Daughter   . Fibromyalgia Daughter   . Allergic rhinitis Daughter   . Migraines Son   . Allergic rhinitis Son   . Brain cancer Sister   . Asthma Sister   . Angioedema Neg Hx   . Eczema Neg Hx   . Immunodeficiency Neg Hx   .  Urticaria Neg Hx   . Colon cancer Neg Hx      Current Outpatient Medications:  .  ALPRAZolam (XANAX) 0.25 MG tablet, Take 0.25 mg by mouth at bedtime as needed for anxiety or sleep. , Disp: , Rfl: 0 .  ARIPiprazole (ABILIFY) 2 MG tablet, Take 2 mg by mouth daily., Disp: , Rfl:  .  Azelastine HCl 0.15 % SOLN, Place into the nose., Disp: , Rfl:  .  calcium-vitamin D (OSCAL WITH D) 500-200 MG-UNIT tablet, Take 1 tablet by mouth 3 (three) times daily., Disp: 90 tablet, Rfl: 12 .  cholecalciferol (VITAMIN D) 1000 units tablet, Take 2,000 Units by mouth at bedtime., Disp: , Rfl:  .  clobetasol  ointment (TEMOVATE) 9.56 %, Apply 1 application topically 2 (two) times daily as needed (for rash)., Disp: , Rfl:  .  cyclobenzaprine (FLEXERIL) 10 MG tablet, Take 1 tablet (10 mg total) by mouth 3 (three) times daily as needed for muscle spasms., Disp: 30 tablet, Rfl: 1 .  famotidine-calcium carbonate-magnesium hydroxide (PEPCID COMPLETE) 10-800-165 MG chewable tablet, Chew 1 tablet by mouth daily as needed., Disp: , Rfl:  .  fexofenadine (ALLEGRA ALLERGY) 180 MG tablet, Take by mouth., Disp: , Rfl:  .  fluticasone (FLONASE) 50 MCG/ACT nasal spray, Place into both nostrils daily., Disp: , Rfl:  .  hydrochlorothiazide (HYDRODIURIL) 25 MG tablet, TAKE 1 TABLET BY MOUTH EVERY DAY, Disp: 90 tablet, Rfl: 1 .  ibuprofen (ADVIL,MOTRIN) 200 MG tablet, Take 400 mg by mouth every 6 (six) hours as needed for headache, mild pain or moderate pain., Disp: , Rfl:  .  levothyroxine (SYNTHROID) 50 MCG tablet, TAKE 1 TABLET BY MOUTH EVERY MORNING MONDAY-SATURDAY AND TAKE 2 TABLETS BY MOUTH ON SUNDAY, Disp: 105 tablet, Rfl: 3 .  liothyronine (CYTOMEL) 5 MCG tablet, TAKE 1 AND 1/2 TABLET BY MOUTH ONCE DAILY, Disp: 135 tablet, Rfl: 3 .  metoprolol tartrate (LOPRESSOR) 25 MG tablet, TAKE 1 TABLET BY MOUTH TWICE A DAY, Disp: 180 tablet, Rfl: 1 .  phenylephrine (SUDAFED PE) 10 MG TABS tablet, Take 10 mg by mouth every 4 (four) hours as needed (for congestion)., Disp: , Rfl:  .  buPROPion (WELLBUTRIN XL) 300 MG 24 hr tablet, Take 300 mg by mouth every morning., Disp: , Rfl:  .  desvenlafaxine (PRISTIQ) 100 MG 24 hr tablet, Take 100 mg by mouth every morning., Disp: , Rfl:   Review of Systems:  Constitutional: Denies fever, chills, diaphoresis, appetite change and fatigue.  HEENT: Denies photophobia, eye pain, redness, hearing loss, ear pain, congestion, sore throat, rhinorrhea, sneezing, mouth sores, trouble swallowing, neck pain, neck stiffness and tinnitus.   Respiratory: Denies SOB, DOE, cough, chest tightness,  and  wheezing.   Cardiovascular: Denies chest pain, palpitations and leg swelling.  Gastrointestinal: Denies nausea, vomiting, abdominal pain, diarrhea, constipation, blood in stool and abdominal distention.  Genitourinary: Denies dysuria, urgency, frequency, hematuria and difficulty urinating.  Endocrine: Denies: hot or cold intolerance, sweats, changes in hair or nails, polyuria, polydipsia. Musculoskeletal: Denies myalgias, back pain, joint swelling, arthralgias and gait problem.  Skin: Denies pallor, rash and wound.  Neurological: Denies dizziness, seizures, syncope, weakness, light-headedness, numbness and headaches.  Hematological: Denies adenopathy. Easy bruising, personal or family bleeding history  Psychiatric/Behavioral: Denies suicidal ideation, mood changes, confusion, nervousness, sleep disturbance and agitation    Physical Exam: Vitals:   01/10/20 1323  BP: 138/85  Pulse: (!) 55  Resp: 20  Temp: 98.6 F (37 C)  TempSrc: Oral  SpO2: 96%  Weight: 241 lb 9.6 oz (109.6 kg)  Height: 5\' 3"  (1.6 m)    Body mass index is 42.8 kg/m.   Constitutional: NAD, calm, comfortable Eyes: PERRL, lids and conjunctivae normal, wears corrective lenses ENMT: Mucous membranes are moist.  Respiratory: clear to auscultation bilaterally, no wheezing, no crackles. Normal respiratory effort. No accessory muscle use.  Cardiovascular: Regular rate and rhythm, no murmurs / rubs / gallops. No extremity edema.  Neurologic: Grossly intact and nonfocal Psychiatric: Normal judgment and insight. Alert and oriented x 3. Normal mood.    Impression and Plan:  Right flank pain -Urine dipstick positive for blood, no nitrite, leukocytes or bacteria. -Sent for UA and culture, send for renal ultrasound, suspect pain from kidney stone.  Hyponatremia  - Plan: Basic metabolic panel  Acquired hypothyroidism  - Plan: TSH  Essential hypertension -Fair control on metoprolol.  Moderate episode of recurrent  major depressive disorder ([place]) -She follows with psychiatry for this.  Vitamin D deficiency  - Plan: VITAMIN D 25 Hydroxy (Vit-D Deficiency, Fractures)    Patient Instructions  -Nice seeing you today!!  -Lab work today; will notify you once results are available.  -Schedule follow up in 6 months. Please come in fasting that day.  -Flu vaccine today.     Lelon Frohlich, MD [facility]

## 2020-01-10 NOTE — Patient Instructions (Addendum)
-  Nice seeing you today!!  -Lab work today; will notify you once results are available.  -Some blood in your urine, suspect kidney stone. Will send for a kidney ultrasound and a urine culture. Will notify you once results are available.  -Schedule follow up in 6 months. Please come in fasting that day.  -Flu vaccine today.

## 2020-01-10 NOTE — Addendum Note (Signed)
Addended by: Jannette Spanner on: 01/10/2020 02:16 PM   Modules accepted: Orders

## 2020-01-11 LAB — BASIC METABOLIC PANEL
BUN: 10 mg/dL (ref 7–25)
CO2: 27 mmol/L (ref 20–32)
Calcium: 8.9 mg/dL (ref 8.6–10.4)
Chloride: 98 mmol/L (ref 98–110)
Creat: 0.92 mg/dL (ref 0.50–1.05)
Glucose, Bld: 120 mg/dL — ABNORMAL HIGH (ref 65–99)
Potassium: 4 mmol/L (ref 3.5–5.3)
Sodium: 133 mmol/L — ABNORMAL LOW (ref 135–146)

## 2020-01-11 LAB — URINALYSIS, ROUTINE W REFLEX MICROSCOPIC
Bilirubin Urine: NEGATIVE
Glucose, UA: NEGATIVE
Hgb urine dipstick: NEGATIVE
Ketones, ur: NEGATIVE
Leukocytes,Ua: NEGATIVE
Nitrite: NEGATIVE
Protein, ur: NEGATIVE
Specific Gravity, Urine: 1.006 (ref 1.001–1.03)
pH: 6 (ref 5.0–8.0)

## 2020-01-11 LAB — URINE CULTURE
MICRO NUMBER:: 11067072
SPECIMEN QUALITY:: ADEQUATE

## 2020-01-11 LAB — TSH: TSH: 1.53 mIU/L (ref 0.40–4.50)

## 2020-01-11 LAB — VITAMIN D 25 HYDROXY (VIT D DEFICIENCY, FRACTURES): Vit D, 25-Hydroxy: 37 ng/mL (ref 30–100)

## 2020-01-17 ENCOUNTER — Ambulatory Visit
Admission: RE | Admit: 2020-01-17 | Discharge: 2020-01-17 | Disposition: A | Discharge status: Home / Self Care | Payer: BC Managed Care – PPO | Source: Ambulatory Visit | Attending: Internal Medicine | Admitting: Internal Medicine

## 2020-01-17 ENCOUNTER — Other Ambulatory Visit: Payer: Self-pay

## 2020-01-17 DIAGNOSIS — R319 Hematuria, unspecified: Secondary | ICD-10-CM | POA: Diagnosis not present

## 2020-01-17 DIAGNOSIS — R109 Unspecified abdominal pain: Secondary | ICD-10-CM | POA: Diagnosis not present

## 2020-01-17 DIAGNOSIS — I10 Essential (primary) hypertension: Secondary | ICD-10-CM | POA: Diagnosis not present

## 2020-01-23 DIAGNOSIS — F331 Major depressive disorder, recurrent, moderate: Secondary | ICD-10-CM | POA: Diagnosis not present

## 2020-02-13 DIAGNOSIS — F331 Major depressive disorder, recurrent, moderate: Secondary | ICD-10-CM | POA: Diagnosis not present

## 2020-02-13 DIAGNOSIS — F419 Anxiety disorder, unspecified: Secondary | ICD-10-CM | POA: Diagnosis not present

## 2020-02-27 DIAGNOSIS — F331 Major depressive disorder, recurrent, moderate: Secondary | ICD-10-CM | POA: Diagnosis not present

## 2020-02-27 DIAGNOSIS — F419 Anxiety disorder, unspecified: Secondary | ICD-10-CM | POA: Diagnosis not present

## 2020-02-28 DIAGNOSIS — F332 Major depressive disorder, recurrent severe without psychotic features: Secondary | ICD-10-CM | POA: Diagnosis not present

## 2020-03-06 DIAGNOSIS — F332 Major depressive disorder, recurrent severe without psychotic features: Secondary | ICD-10-CM | POA: Diagnosis not present

## 2020-03-07 DIAGNOSIS — F332 Major depressive disorder, recurrent severe without psychotic features: Secondary | ICD-10-CM | POA: Diagnosis not present

## 2020-03-08 DIAGNOSIS — F332 Major depressive disorder, recurrent severe without psychotic features: Secondary | ICD-10-CM | POA: Diagnosis not present

## 2020-03-11 DIAGNOSIS — F332 Major depressive disorder, recurrent severe without psychotic features: Secondary | ICD-10-CM | POA: Diagnosis not present

## 2020-03-12 DIAGNOSIS — F332 Major depressive disorder, recurrent severe without psychotic features: Secondary | ICD-10-CM | POA: Diagnosis not present

## 2020-03-13 DIAGNOSIS — F332 Major depressive disorder, recurrent severe without psychotic features: Secondary | ICD-10-CM | POA: Diagnosis not present

## 2020-03-14 DIAGNOSIS — F332 Major depressive disorder, recurrent severe without psychotic features: Secondary | ICD-10-CM | POA: Diagnosis not present

## 2020-03-15 DIAGNOSIS — F332 Major depressive disorder, recurrent severe without psychotic features: Secondary | ICD-10-CM | POA: Diagnosis not present

## 2020-03-15 DIAGNOSIS — F331 Major depressive disorder, recurrent, moderate: Secondary | ICD-10-CM | POA: Diagnosis not present

## 2020-03-18 DIAGNOSIS — F331 Major depressive disorder, recurrent, moderate: Secondary | ICD-10-CM | POA: Diagnosis not present

## 2020-03-18 DIAGNOSIS — F332 Major depressive disorder, recurrent severe without psychotic features: Secondary | ICD-10-CM | POA: Diagnosis not present

## 2020-03-18 DIAGNOSIS — F419 Anxiety disorder, unspecified: Secondary | ICD-10-CM | POA: Diagnosis not present

## 2020-03-19 DIAGNOSIS — F332 Major depressive disorder, recurrent severe without psychotic features: Secondary | ICD-10-CM | POA: Diagnosis not present

## 2020-03-20 DIAGNOSIS — F332 Major depressive disorder, recurrent severe without psychotic features: Secondary | ICD-10-CM | POA: Diagnosis not present

## 2020-03-21 DIAGNOSIS — F332 Major depressive disorder, recurrent severe without psychotic features: Secondary | ICD-10-CM | POA: Diagnosis not present

## 2020-03-25 DIAGNOSIS — F332 Major depressive disorder, recurrent severe without psychotic features: Secondary | ICD-10-CM | POA: Diagnosis not present

## 2020-03-26 DIAGNOSIS — F332 Major depressive disorder, recurrent severe without psychotic features: Secondary | ICD-10-CM | POA: Diagnosis not present

## 2020-03-27 DIAGNOSIS — F332 Major depressive disorder, recurrent severe without psychotic features: Secondary | ICD-10-CM | POA: Diagnosis not present

## 2020-03-28 DIAGNOSIS — F332 Major depressive disorder, recurrent severe without psychotic features: Secondary | ICD-10-CM | POA: Diagnosis not present

## 2020-03-28 DIAGNOSIS — G4733 Obstructive sleep apnea (adult) (pediatric): Secondary | ICD-10-CM | POA: Diagnosis not present

## 2020-04-01 DIAGNOSIS — F332 Major depressive disorder, recurrent severe without psychotic features: Secondary | ICD-10-CM | POA: Diagnosis not present

## 2020-04-02 DIAGNOSIS — F332 Major depressive disorder, recurrent severe without psychotic features: Secondary | ICD-10-CM | POA: Diagnosis not present

## 2020-04-03 DIAGNOSIS — F332 Major depressive disorder, recurrent severe without psychotic features: Secondary | ICD-10-CM | POA: Diagnosis not present

## 2020-04-04 DIAGNOSIS — F332 Major depressive disorder, recurrent severe without psychotic features: Secondary | ICD-10-CM | POA: Diagnosis not present

## 2020-04-05 DIAGNOSIS — F332 Major depressive disorder, recurrent severe without psychotic features: Secondary | ICD-10-CM | POA: Diagnosis not present

## 2020-04-08 DIAGNOSIS — Z79899 Other long term (current) drug therapy: Secondary | ICD-10-CM | POA: Diagnosis not present

## 2020-04-08 DIAGNOSIS — F332 Major depressive disorder, recurrent severe without psychotic features: Secondary | ICD-10-CM | POA: Diagnosis not present

## 2020-04-09 DIAGNOSIS — F332 Major depressive disorder, recurrent severe without psychotic features: Secondary | ICD-10-CM | POA: Diagnosis not present

## 2020-04-10 DIAGNOSIS — F332 Major depressive disorder, recurrent severe without psychotic features: Secondary | ICD-10-CM | POA: Diagnosis not present

## 2020-04-11 DIAGNOSIS — F332 Major depressive disorder, recurrent severe without psychotic features: Secondary | ICD-10-CM | POA: Diagnosis not present

## 2020-04-12 DIAGNOSIS — F332 Major depressive disorder, recurrent severe without psychotic features: Secondary | ICD-10-CM | POA: Diagnosis not present

## 2020-04-16 DIAGNOSIS — F332 Major depressive disorder, recurrent severe without psychotic features: Secondary | ICD-10-CM | POA: Diagnosis not present

## 2020-04-17 DIAGNOSIS — F331 Major depressive disorder, recurrent, moderate: Secondary | ICD-10-CM | POA: Diagnosis not present

## 2020-04-17 DIAGNOSIS — F332 Major depressive disorder, recurrent severe without psychotic features: Secondary | ICD-10-CM | POA: Diagnosis not present

## 2020-04-17 DIAGNOSIS — F419 Anxiety disorder, unspecified: Secondary | ICD-10-CM | POA: Diagnosis not present

## 2020-04-18 DIAGNOSIS — F332 Major depressive disorder, recurrent severe without psychotic features: Secondary | ICD-10-CM | POA: Diagnosis not present

## 2020-04-19 DIAGNOSIS — F332 Major depressive disorder, recurrent severe without psychotic features: Secondary | ICD-10-CM | POA: Diagnosis not present

## 2020-04-20 ENCOUNTER — Other Ambulatory Visit: Payer: Self-pay | Admitting: Internal Medicine

## 2020-04-20 DIAGNOSIS — I10 Essential (primary) hypertension: Secondary | ICD-10-CM

## 2020-04-22 DIAGNOSIS — F332 Major depressive disorder, recurrent severe without psychotic features: Secondary | ICD-10-CM | POA: Diagnosis not present

## 2020-04-23 DIAGNOSIS — F331 Major depressive disorder, recurrent, moderate: Secondary | ICD-10-CM | POA: Diagnosis not present

## 2020-04-24 DIAGNOSIS — F332 Major depressive disorder, recurrent severe without psychotic features: Secondary | ICD-10-CM | POA: Diagnosis not present

## 2020-04-26 DIAGNOSIS — F332 Major depressive disorder, recurrent severe without psychotic features: Secondary | ICD-10-CM | POA: Diagnosis not present

## 2020-04-29 DIAGNOSIS — F332 Major depressive disorder, recurrent severe without psychotic features: Secondary | ICD-10-CM | POA: Diagnosis not present

## 2020-05-01 DIAGNOSIS — F332 Major depressive disorder, recurrent severe without psychotic features: Secondary | ICD-10-CM | POA: Diagnosis not present

## 2020-05-02 DIAGNOSIS — F332 Major depressive disorder, recurrent severe without psychotic features: Secondary | ICD-10-CM | POA: Diagnosis not present

## 2020-05-07 DIAGNOSIS — F332 Major depressive disorder, recurrent severe without psychotic features: Secondary | ICD-10-CM | POA: Diagnosis not present

## 2020-05-09 DIAGNOSIS — F332 Major depressive disorder, recurrent severe without psychotic features: Secondary | ICD-10-CM | POA: Diagnosis not present

## 2020-06-12 DIAGNOSIS — F332 Major depressive disorder, recurrent severe without psychotic features: Secondary | ICD-10-CM | POA: Diagnosis not present

## 2020-06-25 DIAGNOSIS — F419 Anxiety disorder, unspecified: Secondary | ICD-10-CM | POA: Diagnosis not present

## 2020-06-25 DIAGNOSIS — F331 Major depressive disorder, recurrent, moderate: Secondary | ICD-10-CM | POA: Diagnosis not present

## 2020-06-26 DIAGNOSIS — G4733 Obstructive sleep apnea (adult) (pediatric): Secondary | ICD-10-CM | POA: Diagnosis not present

## 2020-07-09 ENCOUNTER — Encounter: Payer: Self-pay | Admitting: Internal Medicine

## 2020-07-09 ENCOUNTER — Other Ambulatory Visit: Payer: Self-pay

## 2020-07-09 ENCOUNTER — Ambulatory Visit (INDEPENDENT_AMBULATORY_CARE_PROVIDER_SITE_OTHER): Payer: BC Managed Care – PPO | Admitting: Internal Medicine

## 2020-07-09 VITALS — BP 130/80 | HR 56 | Temp 98.0°F | Ht 63.0 in | Wt 245.5 lb

## 2020-07-09 DIAGNOSIS — F331 Major depressive disorder, recurrent, moderate: Secondary | ICD-10-CM

## 2020-07-09 DIAGNOSIS — E039 Hypothyroidism, unspecified: Secondary | ICD-10-CM

## 2020-07-09 DIAGNOSIS — K219 Gastro-esophageal reflux disease without esophagitis: Secondary | ICD-10-CM

## 2020-07-09 DIAGNOSIS — I10 Essential (primary) hypertension: Secondary | ICD-10-CM | POA: Diagnosis not present

## 2020-07-09 DIAGNOSIS — E559 Vitamin D deficiency, unspecified: Secondary | ICD-10-CM | POA: Diagnosis not present

## 2020-07-09 DIAGNOSIS — Z23 Encounter for immunization: Secondary | ICD-10-CM

## 2020-07-09 DIAGNOSIS — Z0000 Encounter for general adult medical examination without abnormal findings: Secondary | ICD-10-CM | POA: Diagnosis not present

## 2020-07-09 LAB — CBC WITH DIFFERENTIAL/PLATELET
Basophils Absolute: 0 10*3/uL (ref 0.0–0.1)
Basophils Relative: 1 % (ref 0.0–3.0)
Eosinophils Absolute: 0.1 10*3/uL (ref 0.0–0.7)
Eosinophils Relative: 3.2 % (ref 0.0–5.0)
HCT: 41.3 % (ref 36.0–46.0)
Hemoglobin: 13.9 g/dL (ref 12.0–15.0)
Lymphocytes Relative: 34.7 % (ref 12.0–46.0)
Lymphs Abs: 1.5 10*3/uL (ref 0.7–4.0)
MCHC: 33.5 g/dL (ref 30.0–36.0)
MCV: 87.5 fl (ref 78.0–100.0)
Monocytes Absolute: 0.4 10*3/uL (ref 0.1–1.0)
Monocytes Relative: 9.3 % (ref 3.0–12.0)
Neutro Abs: 2.3 10*3/uL (ref 1.4–7.7)
Neutrophils Relative %: 51.8 % (ref 43.0–77.0)
Platelets: 244 10*3/uL (ref 150.0–400.0)
RBC: 4.72 Mil/uL (ref 3.87–5.11)
RDW: 13.5 % (ref 11.5–15.5)
WBC: 4.4 10*3/uL (ref 4.0–10.5)

## 2020-07-09 LAB — COMPREHENSIVE METABOLIC PANEL
ALT: 20 U/L (ref 0–35)
AST: 13 U/L (ref 0–37)
Albumin: 4 g/dL (ref 3.5–5.2)
Alkaline Phosphatase: 64 U/L (ref 39–117)
BUN: 13 mg/dL (ref 6–23)
CO2: 29 mEq/L (ref 19–32)
Calcium: 9.4 mg/dL (ref 8.4–10.5)
Chloride: 99 mEq/L (ref 96–112)
Creatinine, Ser: 1.01 mg/dL (ref 0.40–1.20)
GFR: 60.81 mL/min (ref >60.00)
Glucose, Bld: 87 mg/dL (ref 70–99)
Potassium: 4.4 mEq/L (ref 3.5–5.1)
Sodium: 136 mEq/L (ref 135–145)
Total Bilirubin: 0.4 mg/dL (ref 0.2–1.2)
Total Protein: 7.1 g/dL (ref 6.0–8.3)

## 2020-07-09 LAB — LIPID PANEL
Cholesterol: 175 mg/dL (ref 0–200)
HDL: 54.1 mg/dL (ref >39.00)
LDL Cholesterol: 104 mg/dL — ABNORMAL HIGH (ref 0–99)
NonHDL: 120.49
Total CHOL/HDL Ratio: 3
Triglycerides: 81 mg/dL (ref 0.0–149.0)
VLDL: 16.2 mg/dL (ref 0.0–40.0)

## 2020-07-09 LAB — HEMOGLOBIN A1C: Hgb A1c MFr Bld: 5.8 % (ref 4.6–6.5)

## 2020-07-09 LAB — TSH: TSH: 0.87 u[IU]/mL (ref 0.35–4.50)

## 2020-07-09 LAB — VITAMIN B12: Vitamin B-12: 416 pg/mL (ref 211–911)

## 2020-07-09 LAB — VITAMIN D 25 HYDROXY (VIT D DEFICIENCY, FRACTURES): VITD: 41.96 ng/mL (ref 30.00–100.00)

## 2020-07-09 NOTE — Progress Notes (Signed)
Established Patient Office Visit     This visit occurred during the SARS-CoV-2 public health emergency.  Safety protocols were in place, including screening questions prior to the visit, additional usage of staff PPE, and extensive cleaning of exam room while observing appropriate contact time as indicated for disinfecting solutions.    CC/Reason for Visit: Annual preventive exam  HPI: Mary Reyes is a 60 y.o. female who is coming in today for the above mentioned reasons. Past Medical History is significant for:   1. Hypertension that has been well controlled recently.  2. Obstructive sleep apnea that is well controlled on CPAP.  3. GERD that is well controlled on famotidine.  5. Iatrogenic hypothyroidism following radioactive iodine ablation for Graves' disease in 2001. She is followed by endocrinology, Dr. Izell [place] is on both levothyroxine and Cytomel.  6. Depression which is severe and followed by psychiatry. She is currently seeing them on a monthly basis for medication adjustments and feels like just recently she has gotten a "good place".  It appears she might have had electroconvulsive therapy recently.  7. Morbid obesity  8. Vitamin D deficiency  She has no acute complaints today.  She has routine eye and dental care.  She does not exercise routinely.  She is due for her shingles vaccination series as well as her fourth Covid vaccine.  She had a colonoscopy in 2018 and a mammogram in May 2021.   Past Medical/Surgical History: Past Medical History:  Diagnosis Date  . Allergy   . Anxiety   . Arthritis   . Blood in stool   . Colon polyp   . Depression   . Eczema   . Frequent headaches   . GERD (gastroesophageal reflux disease)   . Hypertension   . Hypothyroidism   . Migraine   . OSA (obstructive sleep apnea)    CPAP nightly  . Osteopenia   . Sleep apnea   . Thyroid disease   . Trimalleolar fracture of ankle, closed, left, sequela      Past Surgical History:  Procedure Laterality Date  . ABDOMINAL HYSTERECTOMY  2004  . ORIF ANKLE FRACTURE Left 02/12/2016   Procedure: OPEN REDUCTION INTERNAL FIXATION (ORIF) LEFT ANKLE FRACTURE;  Surgeon: Leandrew Koyanagi, MD;  Location: [facility];  Service: Orthopedics;  Laterality: Left;  . WISDOM TOOTH EXTRACTION      Social History:  reports that she has never smoked. She has never used smokeless tobacco. She reports that she does not drink alcohol and does not use drugs.  Allergies: Allergies  Allergen Reactions  . Adhesive [Tape] Rash  . Benzoyl Peroxide Rash    Benzine   . Milk-Related Compounds Rash    Family History:  Family History  Problem Relation Age of Onset  . Healthy Mother   . Alcohol abuse Father        Deceased  . Stroke Maternal Grandmother   . Arthritis Paternal Grandmother   . Depression Daughter   . Heart murmur Daughter   . Allergic rhinitis Daughter   . Fibromyalgia Daughter   . Allergic rhinitis Daughter   . Migraines Son   . Allergic rhinitis Son   . Brain cancer Sister   . Asthma Sister   . Angioedema Neg Hx   . Eczema Neg Hx   . Immunodeficiency Neg Hx   . Urticaria Neg Hx   . Colon cancer Neg Hx      Current Outpatient Medications:  .  ALPRAZolam (XANAX) 0.25 MG tablet, Take 0.25 mg by mouth at bedtime as needed for anxiety or sleep. , Disp: , Rfl: 0 .  ARIPiprazole (ABILIFY) 2 MG tablet, Take 2 mg by mouth daily., Disp: , Rfl:  .  Azelastine HCl 0.15 % SOLN, Place into the nose., Disp: , Rfl:  .  buPROPion (WELLBUTRIN XL) 300 MG 24 hr tablet, Take 300 mg by mouth every morning., Disp: , Rfl:  .  calcium-vitamin D (OSCAL WITH D) 500-200 MG-UNIT tablet, Take 1 tablet by mouth 3 (three) times daily., Disp: 90 tablet, Rfl: 12 .  cholecalciferol (VITAMIN D) 1000 units tablet, Take 2,000 Units by mouth at bedtime., Disp: , Rfl:  .  cyclobenzaprine (FLEXERIL) 10 MG tablet, Take 1 tablet (10 mg total) by mouth 3 (three)  times daily as needed for muscle spasms., Disp: 30 tablet, Rfl: 1 .  desvenlafaxine (PRISTIQ) 100 MG 24 hr tablet, Take 100 mg by mouth every morning., Disp: , Rfl:  .  famotidine-calcium carbonate-magnesium hydroxide (PEPCID COMPLETE) 10-800-165 MG chewable tablet, Chew 1 tablet by mouth daily as needed., Disp: , Rfl:  .  fexofenadine (ALLEGRA) 180 MG tablet, Take by mouth., Disp: , Rfl:  .  fluticasone (FLONASE) 50 MCG/ACT nasal spray, Place into both nostrils daily., Disp: , Rfl:  .  ibuprofen (ADVIL,MOTRIN) 200 MG tablet, Take 400 mg by mouth every 6 (six) hours as needed for headache, mild pain or moderate pain., Disp: , Rfl:  .  levothyroxine (SYNTHROID) 50 MCG tablet, TAKE 1 TABLET BY MOUTH EVERY MORNING MONDAY-SATURDAY AND TAKE 2 TABLETS BY MOUTH ON SUNDAY, Disp: 105 tablet, Rfl: 3 .  liothyronine (CYTOMEL) 5 MCG tablet, TAKE 1 AND 1/2 TABLET BY MOUTH ONCE DAILY, Disp: 135 tablet, Rfl: 3 .  metoprolol tartrate (LOPRESSOR) 25 MG tablet, TAKE 1 TABLET BY MOUTH TWICE A DAY, Disp: 180 tablet, Rfl: 1 .  phenylephrine (SUDAFED PE) 10 MG TABS tablet, Take 10 mg by mouth every 4 (four) hours as needed (for congestion)., Disp: , Rfl:  .  hydrochlorothiazide (HYDRODIURIL) 25 MG tablet, TAKE 1 TABLET BY MOUTH EVERY DAY (Patient not taking: Reported on 07/09/2020), Disp: 90 tablet, Rfl: 1  Review of Systems:  Constitutional: Denies fever, chills, diaphoresis, appetite change and fatigue.  HEENT: Denies photophobia, eye pain, redness, hearing loss, ear pain, congestion, sore throat, rhinorrhea, sneezing, mouth sores, trouble swallowing, neck pain, neck stiffness and tinnitus.   Respiratory: Denies SOB, DOE, cough, chest tightness,  and wheezing.   Cardiovascular: Denies chest pain, palpitations and leg swelling.  Gastrointestinal: Denies nausea, vomiting, abdominal pain, diarrhea, constipation, blood in stool and abdominal distention.  Genitourinary: Denies dysuria, urgency, frequency, hematuria, flank  pain and difficulty urinating.  Endocrine: Denies: hot or cold intolerance, sweats, changes in hair or nails, polyuria, polydipsia. Musculoskeletal: Denies myalgias, back pain, joint swelling, arthralgias and gait problem.  Skin: Denies pallor, rash and wound.  Neurological: Denies dizziness, seizures, syncope, weakness, light-headedness, numbness and headaches.  Hematological: Denies adenopathy. Easy bruising, personal or family bleeding history  Psychiatric/Behavioral: Denies suicidal ideation,  confusion, nervousness, sleep disturbance and agitation    Physical Exam: Vitals:   07/09/20 0953  BP: 130/80  Pulse: (!) 56  Temp: 98 F (36.7 C)  TempSrc: Oral  SpO2: 98%  Weight: 245 lb 8 oz (111.4 kg)  Height: '5\' 3"'  (1.6 m)    Body mass index is 43.49 kg/m.   Constitutional: NAD, calm, comfortable, obese Eyes: PERRL, lids and conjunctivae normal, wears corrective lenses ENMT: Mucous  membranes are moist. Posterior pharynx clear of any exudate or lesions. Normal dentition. Tympanic membrane is pearly white, no erythema or bulging. Neck: normal, supple, no masses, no thyromegaly Respiratory: clear to auscultation bilaterally, no wheezing, no crackles. Normal respiratory effort. No accessory muscle use.  Cardiovascular: Regular rate and rhythm, no murmurs / rubs / gallops. No extremity edema. 2+ pedal pulses.  Abdomen: no tenderness, no masses palpated. No hepatosplenomegaly. Bowel sounds positive.  Musculoskeletal: no clubbing / cyanosis. No joint deformity upper and lower extremities. Good ROM, no contractures. Normal muscle tone.  Skin: no rashes, lesions, ulcers. No induration Neurologic: CN 2-12 grossly intact. Sensation intact, DTR normal. Strength 5/5 in all 4.  Psychiatric: Normal judgment and insight. Alert and oriented x 3. Normal mood.    Impression and Plan:  Encounter for preventive health examination  -She has routine eye and dental care. -She is due for her  shingles vaccine and fourth Covid vaccine.  She will get her shingles in office today. -Screening labs today. -Healthy lifestyle discussed in detail. -She had a colonoscopy in 2018 and is a 5-year callback. -She will be due for her mammogram again in May 2021. -She follows with GYN for cervical cancer screening.  Essential hypertension  -Well-controlled on metoprolol.  Vitamin D deficiency  - Plan: VITAMIN D 25 Hydroxy (Vit-D Deficiency, Fractures)  Moderate episode of recurrent major depressive disorder ([place]) -This is severe and followed by psychiatry.  Acquired hypothyroidism -Check TSH today, she is on daily levothyroxine.  Gastroesophageal reflux disease, unspecified whether esophagitis present -On as needed famotidine.  Morbid obesity ([place]) -Discussed healthy lifestyle, including increased physical activity and better food choices to promote weight loss.  Need for shingles vaccine -For shingles vaccine administered today.    Patient Instructions   -Nice seeing you today!!  -Lab work today; will notify you once results are available.  -First shingles vaccine today.  -Remember your fourth COVID vaccine at the pharmacy.  -Schedule follow up in 6 months.   Preventive Care 36-37 Years Old, Female Preventive care refers to lifestyle choices and visits with your health care provider that can promote health and wellness. This includes:  A yearly physical exam. This is also called an annual wellness visit.  Regular dental and eye exams.  Immunizations.  Screening for certain conditions.  Healthy lifestyle choices, such as: ? Eating a healthy diet. ? Getting regular exercise. ? Not using drugs or products that contain nicotine and tobacco. ? Limiting alcohol use. What can I expect for my preventive care visit? Physical exam Your health care provider will check your:  Height and weight. These may be used to calculate your BMI (body mass index). BMI is a  measurement that tells if you are at a healthy weight.  Heart rate and blood pressure.  Body temperature.  Skin for abnormal spots. Counseling Your health care provider may ask you questions about your:  Past medical problems.  Family's medical history.  Alcohol, tobacco, and drug use.  Emotional well-being.  Home life and relationship well-being.  Sexual activity.  Diet, exercise, and sleep habits.  Work and work Statistician.  Access to firearms.  Method of birth control.  Menstrual cycle.  Pregnancy history. What immunizations do I need? Vaccines are usually given at various ages, according to a schedule. Your health care provider will recommend vaccines for you based on your age, medical history, and lifestyle or other factors, such as travel or where you work.   What tests do I need?  Blood tests  Lipid and cholesterol levels. These may be checked every 5 years, or more often if you are over 76 years old.  Hepatitis C test.  Hepatitis B test. Screening  Lung cancer screening. You may have this screening every year starting at age 61 if you have a 30-pack-year history of smoking and currently smoke or have quit within the past 15 years.  Colorectal cancer screening. ? All adults should have this screening starting at age 87 and continuing until age 48. ? Your health care provider may recommend screening at age 76 if you are at increased risk. ? You will have tests every 1-10 years, depending on your results and the type of screening test.  Diabetes screening. ? This is done by checking your blood sugar (glucose) after you have not eaten for a while (fasting). ? You may have this done every 1-3 years.  Mammogram. ? This may be done every 1-2 years. ? Talk with your health care provider about when you should start having regular mammograms. This may depend on whether you have a family history of breast cancer.  BRCA-related cancer screening. This may be  done if you have a family history of breast, ovarian, tubal, or peritoneal cancers.  Pelvic exam and Pap test. ? This may be done every 3 years starting at age 9. ? Starting at age 58, this may be done every 5 years if you have a Pap test in combination with an HPV test. Other tests  STD (sexually transmitted disease) testing, if you are at risk.  Bone density scan. This is done to screen for osteoporosis. You may have this scan if you are at high risk for osteoporosis. Talk with your health care provider about your test results, treatment options, and if necessary, the need for more tests. Follow these instructions at home: Eating and drinking  Eat a diet that includes fresh fruits and vegetables, whole grains, lean protein, and low-fat dairy products.  Take vitamin and mineral supplements as recommended by your health care provider.  Do not drink alcohol if: ? Your health care provider tells you not to drink. ? You are pregnant, may be pregnant, or are planning to become pregnant.  If you drink alcohol: ? Limit how much you have to 0-1 drink a day. ? Be aware of how much alcohol is in your drink. In the [place] one drink equals one 12 oz bottle of beer (355 mL), one 5 oz glass of wine (148 mL), or one 1 oz glass of hard liquor (44 mL).   Lifestyle  Take daily care of your teeth and gums. Brush your teeth every morning and night with fluoride toothpaste. Floss one time each day.  Stay active. Exercise for at least 30 minutes 5 or more days each week.  Do not use any products that contain nicotine or tobacco, such as cigarettes, e-cigarettes, and chewing tobacco. If you need help quitting, ask your health care provider.  Do not use drugs.  If you are sexually active, practice safe sex. Use a condom or other form of protection to prevent STIs (sexually transmitted infections).  If you do not wish to become pregnant, use a form of birth control. If you plan to become pregnant, see  your health care provider for a prepregnancy visit.  If told by your health care provider, take low-dose aspirin daily starting at age 92.  Find healthy ways to cope with stress, such as: ? Meditation, yoga, or listening to  music. ? Journaling. ? Talking to a trusted person. ? Spending time with friends and family. Safety  Always wear your seat belt while driving or riding in a vehicle.  Do not drive: ? If you have been drinking alcohol. Do not ride with someone who has been drinking. ? When you are tired or distracted. ? While texting.  Wear a helmet and other protective equipment during sports activities.  If you have firearms in your house, make sure you follow all gun safety procedures. What's next?  Visit your health care provider once a year for an annual wellness visit.  Ask your health care provider how often you should have your eyes and teeth checked.  Stay up to date on all vaccines. This information is not intended to replace advice given to you by your health care provider. Make sure you discuss any questions you have with your health care provider. Document Revised: 12/19/2019 Document Reviewed: 11/25/2017 Elsevier Patient Education  2021 [place], MD [facility] at [facility]

## 2020-07-09 NOTE — Addendum Note (Signed)
Addended by: Westley Hummer B on: 07/09/2020 04:46 PM   Modules accepted: Orders

## 2020-07-09 NOTE — Patient Instructions (Signed)
-Nice seeing you today!!  -Lab work today; will notify you once results are available.  -First shingles vaccine today.  -Remember your fourth COVID vaccine at the pharmacy.  -Schedule follow up in 6 months.   Preventive Care 69-60 Years Old, Female Preventive care refers to lifestyle choices and visits with your health care provider that can promote health and wellness. This includes:  A yearly physical exam. This is also called an annual wellness visit.  Regular dental and eye exams.  Immunizations.  Screening for certain conditions.  Healthy lifestyle choices, such as: ? Eating a healthy diet. ? Getting regular exercise. ? Not using drugs or products that contain nicotine and tobacco. ? Limiting alcohol use. What can I expect for my preventive care visit? Physical exam Your health care provider will check your:  Height and weight. These may be used to calculate your BMI (body mass index). BMI is a measurement that tells if you are at a healthy weight.  Heart rate and blood pressure.  Body temperature.  Skin for abnormal spots. Counseling Your health care provider may ask you questions about your:  Past medical problems.  Family's medical history.  Alcohol, tobacco, and drug use.  Emotional well-being.  Home life and relationship well-being.  Sexual activity.  Diet, exercise, and sleep habits.  Work and work Statistician.  Access to firearms.  Method of birth control.  Menstrual cycle.  Pregnancy history. What immunizations do I need? Vaccines are usually given at various ages, according to a schedule. Your health care provider will recommend vaccines for you based on your age, medical history, and lifestyle or other factors, such as travel or where you work.   What tests do I need? Blood tests  Lipid and cholesterol levels. These may be checked every 5 years, or more often if you are over 63 years old.  Hepatitis C test.  Hepatitis B  test. Screening  Lung cancer screening. You may have this screening every year starting at age 73 if you have a 30-pack-year history of smoking and currently smoke or have quit within the past 15 years.  Colorectal cancer screening. ? All adults should have this screening starting at age 60 and continuing until age 27. ? Your health care provider may recommend screening at age 61 if you are at increased risk. ? You will have tests every 1-10 years, depending on your results and the type of screening test.  Diabetes screening. ? This is done by checking your blood sugar (glucose) after you have not eaten for a while (fasting). ? You may have this done every 1-3 years.  Mammogram. ? This may be done every 1-2 years. ? Talk with your health care provider about when you should start having regular mammograms. This may depend on whether you have a family history of breast cancer.  BRCA-related cancer screening. This may be done if you have a family history of breast, ovarian, tubal, or peritoneal cancers.  Pelvic exam and Pap test. ? This may be done every 3 years starting at age 23. ? Starting at age 11, this may be done every 5 years if you have a Pap test in combination with an HPV test. Other tests  STD (sexually transmitted disease) testing, if you are at risk.  Bone density scan. This is done to screen for osteoporosis. You may have this scan if you are at high risk for osteoporosis. Talk with your health care provider about your test results, treatment options, and if  necessary, the need for more tests. Follow these instructions at home: Eating and drinking  Eat a diet that includes fresh fruits and vegetables, whole grains, lean protein, and low-fat dairy products.  Take vitamin and mineral supplements as recommended by your health care provider.  Do not drink alcohol if: ? Your health care provider tells you not to drink. ? You are pregnant, may be pregnant, or are planning  to become pregnant.  If you drink alcohol: ? Limit how much you have to 0-1 drink a day. ? Be aware of how much alcohol is in your drink. In the [place] one drink equals one 12 oz bottle of beer (355 mL), one 5 oz glass of wine (148 mL), or one 1 oz glass of hard liquor (44 mL).   Lifestyle  Take daily care of your teeth and gums. Brush your teeth every morning and night with fluoride toothpaste. Floss one time each day.  Stay active. Exercise for at least 30 minutes 5 or more days each week.  Do not use any products that contain nicotine or tobacco, such as cigarettes, e-cigarettes, and chewing tobacco. If you need help quitting, ask your health care provider.  Do not use drugs.  If you are sexually active, practice safe sex. Use a condom or other form of protection to prevent STIs (sexually transmitted infections).  If you do not wish to become pregnant, use a form of birth control. If you plan to become pregnant, see your health care provider for a prepregnancy visit.  If told by your health care provider, take low-dose aspirin daily starting at age 32.  Find healthy ways to cope with stress, such as: ? Meditation, yoga, or listening to music. ? Journaling. ? Talking to a trusted person. ? Spending time with friends and family. Safety  Always wear your seat belt while driving or riding in a vehicle.  Do not drive: ? If you have been drinking alcohol. Do not ride with someone who has been drinking. ? When you are tired or distracted. ? While texting.  Wear a helmet and other protective equipment during sports activities.  If you have firearms in your house, make sure you follow all gun safety procedures. What's next?  Visit your health care provider once a year for an annual wellness visit.  Ask your health care provider how often you should have your eyes and teeth checked.  Stay up to date on all vaccines. This information is not intended to replace advice given to you  by your health care provider. Make sure you discuss any questions you have with your health care provider. Document Revised: 12/19/2019 Document Reviewed: 11/25/2017 Elsevier Patient Education  2021 [employer].

## 2020-07-10 DIAGNOSIS — F419 Anxiety disorder, unspecified: Secondary | ICD-10-CM | POA: Diagnosis not present

## 2020-07-10 DIAGNOSIS — F331 Major depressive disorder, recurrent, moderate: Secondary | ICD-10-CM | POA: Diagnosis not present

## 2020-08-16 ENCOUNTER — Other Ambulatory Visit: Payer: Self-pay | Admitting: Internal Medicine

## 2020-08-16 DIAGNOSIS — I10 Essential (primary) hypertension: Secondary | ICD-10-CM

## 2020-09-05 ENCOUNTER — Other Ambulatory Visit: Payer: Self-pay | Admitting: Internal Medicine

## 2020-09-05 DIAGNOSIS — Z1231 Encounter for screening mammogram for malignant neoplasm of breast: Secondary | ICD-10-CM

## 2020-09-09 ENCOUNTER — Ambulatory Visit
Admission: RE | Admit: 2020-09-09 | Discharge: 2020-09-09 | Disposition: A | Discharge status: Home / Self Care | Payer: BC Managed Care – PPO | Source: Ambulatory Visit | Attending: Internal Medicine | Admitting: Internal Medicine

## 2020-09-09 ENCOUNTER — Other Ambulatory Visit: Payer: Self-pay

## 2020-09-09 DIAGNOSIS — Z1231 Encounter for screening mammogram for malignant neoplasm of breast: Secondary | ICD-10-CM | POA: Diagnosis not present

## 2020-09-17 ENCOUNTER — Emergency Department (HOSPITAL_COMMUNITY)
Admission: EM | Admit: 2020-09-17 | Discharge: 2020-09-18 | Disposition: A | Discharge status: Home / Self Care | Payer: BC Managed Care – PPO | Attending: Emergency Medicine | Admitting: Emergency Medicine

## 2020-09-17 ENCOUNTER — Emergency Department (HOSPITAL_COMMUNITY): Payer: BC Managed Care – PPO

## 2020-09-17 ENCOUNTER — Encounter (HOSPITAL_COMMUNITY): Payer: Self-pay

## 2020-09-17 ENCOUNTER — Other Ambulatory Visit: Payer: Self-pay

## 2020-09-17 DIAGNOSIS — R0902 Hypoxemia: Secondary | ICD-10-CM | POA: Diagnosis not present

## 2020-09-17 DIAGNOSIS — Z79899 Other long term (current) drug therapy: Secondary | ICD-10-CM | POA: Insufficient documentation

## 2020-09-17 DIAGNOSIS — H5500 Unspecified nystagmus: Secondary | ICD-10-CM | POA: Diagnosis not present

## 2020-09-17 DIAGNOSIS — H7093 Unspecified mastoiditis, bilateral: Secondary | ICD-10-CM

## 2020-09-17 DIAGNOSIS — R42 Dizziness and giddiness: Secondary | ICD-10-CM | POA: Insufficient documentation

## 2020-09-17 DIAGNOSIS — Q283 Other malformations of cerebral vessels: Secondary | ICD-10-CM | POA: Diagnosis not present

## 2020-09-17 DIAGNOSIS — I10 Essential (primary) hypertension: Secondary | ICD-10-CM | POA: Insufficient documentation

## 2020-09-17 DIAGNOSIS — R110 Nausea: Secondary | ICD-10-CM | POA: Diagnosis not present

## 2020-09-17 DIAGNOSIS — E039 Hypothyroidism, unspecified: Secondary | ICD-10-CM | POA: Insufficient documentation

## 2020-09-17 LAB — CBC WITH DIFFERENTIAL/PLATELET
Abs Immature Granulocytes: 0.01 10*3/uL (ref 0.00–0.07)
Basophils Absolute: 0 10*3/uL (ref 0.0–0.1)
Basophils Relative: 1 %
Eosinophils Absolute: 0 10*3/uL (ref 0.0–0.5)
Eosinophils Relative: 0 %
HCT: 41.2 % (ref 36.0–46.0)
Hemoglobin: 13.7 g/dL (ref 12.0–15.0)
Immature Granulocytes: 0 %
Lymphocytes Relative: 12 %
Lymphs Abs: 0.5 10*3/uL — ABNORMAL LOW (ref 0.7–4.0)
MCH: 29.4 pg (ref 26.0–34.0)
MCHC: 33.3 g/dL (ref 30.0–36.0)
MCV: 88.4 fL (ref 80.0–100.0)
Monocytes Absolute: 0.2 10*3/uL (ref 0.1–1.0)
Monocytes Relative: 4 %
Neutro Abs: 3.9 10*3/uL (ref 1.7–7.7)
Neutrophils Relative %: 83 %
Platelets: 303 10*3/uL (ref 150–400)
RBC: 4.66 MIL/uL (ref 3.87–5.11)
RDW: 13.1 % (ref 11.5–15.5)
WBC: 4.7 10*3/uL (ref 4.0–10.5)
nRBC: 0 % (ref 0.0–0.2)

## 2020-09-17 LAB — BASIC METABOLIC PANEL
Anion gap: 10 (ref 5–15)
BUN: 8 mg/dL (ref 6–20)
CO2: 24 mmol/L (ref 22–32)
Calcium: 8.9 mg/dL (ref 8.9–10.3)
Chloride: 99 mmol/L (ref 98–111)
Creatinine, Ser: 0.93 mg/dL (ref 0.44–1.00)
GFR, Estimated: >60 mL/min (ref >60)
Glucose, Bld: 151 mg/dL — ABNORMAL HIGH (ref 70–99)
Potassium: 4.2 mmol/L (ref 3.5–5.1)
Sodium: 133 mmol/L — ABNORMAL LOW (ref 135–145)

## 2020-09-17 LAB — CBG MONITORING, ED: Glucose-Capillary: 123 mg/dL — ABNORMAL HIGH (ref 70–99)

## 2020-09-17 LAB — MAGNESIUM: Magnesium: 2 mg/dL (ref 1.7–2.4)

## 2020-09-17 MED ORDER — DIAZEPAM 5 MG PO TABS
5.0000 mg | ORAL_TABLET | Freq: Once | ORAL | Status: AC
  Administered 2020-09-17: 5 mg via ORAL
  Filled 2020-09-17: qty 1

## 2020-09-17 MED ORDER — MECLIZINE HCL 25 MG PO TABS
25.0000 mg | ORAL_TABLET | Freq: Once | ORAL | Status: AC
  Administered 2020-09-17: 25 mg via ORAL
  Filled 2020-09-17: qty 1

## 2020-09-17 MED ORDER — ONDANSETRON HCL 4 MG/2ML IJ SOLN
4.0000 mg | Freq: Once | INTRAMUSCULAR | Status: AC
  Administered 2020-09-17: 4 mg via INTRAVENOUS
  Filled 2020-09-17: qty 2

## 2020-09-17 MED ORDER — MECLIZINE HCL 25 MG PO TABS: 25.0000 mg | ORAL_TABLET | Freq: Three times a day (TID) | ORAL | 0 refills | Status: DC | PRN

## 2020-09-17 NOTE — Discharge Instructions (Addendum)
Thank you for allowing me to care for you today in the Emergency Department.   Your MRI shows that you have a DVM.  This is a collection of veins that are congenital.  You will need to follow-up with a neurosurgeon regarding this.  We have provided you with a referral above.  Take the meclizine once every 8 hours as needed for dizziness.  Take 1 tablet of Augmentin 2 times daily for the next 7 days.  Let 1 tablet of Zofran dissolve under your tongue every 8 hours as needed for nausea or vomiting.  Call to schedule a follow-up appointment with the ear nose and throat team.  Their office information is listed above.  Return to the ED if you start to experience worsening dizziness, headache, blurry vision, worsening numbness in arms or legs, head injury, slurred speech, if you become unable to walk, or have other new, concerning symptoms.

## 2020-09-17 NOTE — ED Provider Notes (Signed)
[facility] EMERGENCY DEPARTMENT Provider Note   CSN: [id number] Arrival date & time: 09/17/20  1404     History Chief Complaint  Patient presents with   Dizziness   Nausea    Mary Reyes is a 59 y.o. female with a past medical history of hypertension, anxiety, arthritis, hypothyroidism presenting to the ED with a chief complaint of dizziness.  States that she woke up this morning around 6 AM in her usual state of health.  She may breakfast for her husband as she typically does and then went back to sleep.  She woke up again around 8:45 AM and noticed that she had felt dizzy.  Felt like the room was spinning around her.  She had trouble standing up and ambulating as she was concerned that she may fall due to the dizziness.  This is worse when she tries to move her head and improves when she lays down.  Denies any vision changes, headache, injuries or falls, chest pain, neck stiffness or fever.  Patient states that since 2001 she has been to the neurologist several times due to "neuropathy throughout my entire body, I don't feel pain like everyone else does. But that runs in my family. They've done so many labs and MRIs but they haven't been able to put a name on what I have."  The symptoms have been unchanged.  No history of prior stroke, bleed or mass.  Last MRI was in 2019.  HPI     Past Medical History:  Diagnosis Date   Allergy    Anxiety    Arthritis    Blood in stool    Colon polyp    Depression    Eczema    Frequent headaches    GERD (gastroesophageal reflux disease)    Hypertension    Hypothyroidism    Migraine    OSA (obstructive sleep apnea)    CPAP nightly   Osteopenia    Sleep apnea    Thyroid disease    Trimalleolar fracture of ankle, closed, left, sequela     Patient Active Problem List   Diagnosis Date Noted   Hyponatremia 07/06/2019   Colon polyp    Pruritus/urticaria 02/17/2016   History of food allergy 02/17/2016   S/P ORIF  (open reduction internal fixation) fracture 02/12/2016   Closed displaced trimalleolar fracture of left lower leg with routine healing 02/10/2016   Heterozygous MTHFR mutation [id number] 01/21/2016   Vitamin D deficiency 08/15/2014   Other specified disorders of bone density and structure, left lower leg 08/15/2014   OSA (obstructive sleep apnea) 08/07/2014   Essential hypertension 06/27/2014   GERD (gastroesophageal reflux disease) 06/27/2014   Hypothyroidism 06/27/2014   Depression 06/27/2014   Allergic rhinitis 06/27/2014    Past Surgical History:  Procedure Laterality Date   ABDOMINAL HYSTERECTOMY  2004   ORIF ANKLE FRACTURE Left 02/12/2016   Procedure: OPEN REDUCTION INTERNAL FIXATION (ORIF) LEFT ANKLE FRACTURE;  Surgeon: Leandrew Koyanagi, MD;  Location: [facility];  Service: Orthopedics;  Laterality: Left;   WISDOM TOOTH EXTRACTION       OB History   No obstetric history on file.     Family History  Problem Relation Age of Onset   Healthy Mother    Alcohol abuse Father        Deceased   Stroke Maternal Grandmother    Arthritis Paternal Grandmother    Depression Daughter    Heart murmur Daughter    Allergic rhinitis  Daughter    Fibromyalgia Daughter    Allergic rhinitis Daughter    Migraines Son    Allergic rhinitis Son    Brain cancer Sister    Asthma Sister    Angioedema Neg Hx    Eczema Neg Hx    Immunodeficiency Neg Hx    Urticaria Neg Hx    Colon cancer Neg Hx     Social History   Tobacco Use   Smoking status: Never   Smokeless tobacco: Never  Vaping Use   Vaping Use: Never used  Substance Use Topics   Alcohol use: No    Alcohol/week: 0.0 standard drinks   Drug use: No    Home Medications Prior to Admission medications   Medication Sig Start Date End Date Taking? Authorizing Provider  meclizine (ANTIVERT) 25 MG tablet Take 1 tablet (25 mg total) by mouth 3 (three) times daily as needed for dizziness. 09/17/20  Yes Durenda Pechacek, PA-C   ALPRAZolam (XANAX) 0.25 MG tablet Take 0.25 mg by mouth at bedtime as needed for anxiety or sleep.     [provider]  ARIPiprazole (ABILIFY) 2 MG tablet Take 2 mg by mouth daily.    [provider]  Azelastine HCl 0.15 % SOLN Place into the nose.    [provider]  buPROPion (WELLBUTRIN XL) 300 MG 24 hr tablet Take 300 mg by mouth every morning. 12/23/19   [provider]  calcium-vitamin D (OSCAL WITH D) 500-200 MG-UNIT tablet Take 1 tablet by mouth 3 (three) times daily. 02/12/16   Leandrew Koyanagi, MD  cholecalciferol (VITAMIN D) 1000 units tablet Take 2,000 Units by mouth at bedtime.    [provider]  cyclobenzaprine (FLEXERIL) 10 MG tablet Take 1 tablet (10 mg total) by mouth 3 (three) times daily as needed for muscle spasms. 07/06/19   Isaac Bliss, Rayford Halsted, MD  desvenlafaxine (PRISTIQ) 100 MG 24 hr tablet Take 100 mg by mouth every morning. 11/18/19   [provider]  famotidine-calcium carbonate-magnesium hydroxide (PEPCID COMPLETE) 10-800-165 MG chewable tablet Chew 1 tablet by mouth daily as needed.    [provider]  fexofenadine (ALLEGRA) 180 MG tablet Take by mouth.    [provider]  fluticasone (FLONASE) 50 MCG/ACT nasal spray Place into both nostrils daily.    [provider]  hydrochlorothiazide (HYDRODIURIL) 25 MG tablet TAKE 1 TABLET BY MOUTH EVERY DAY Patient not taking: Reported on 07/09/2020 08/29/19   Isaac Bliss, Rayford Halsted, MD  ibuprofen (ADVIL,MOTRIN) 200 MG tablet Take 400 mg by mouth every 6 (six) hours as needed for headache, mild pain or moderate pain.    [provider]  levothyroxine (SYNTHROID) 50 MCG tablet TAKE 1 TABLET BY MOUTH EVERY MORNING MONDAY-SATURDAY AND TAKE 2 TABLETS BY MOUTH ON SUNDAY 10/23/19   Philemon Kingdom, MD  liothyronine (CYTOMEL) 5 MCG tablet TAKE 1 AND 1/2 TABLET BY MOUTH ONCE DAILY 10/23/19   Philemon Kingdom, MD  metoprolol tartrate (LOPRESSOR) 25  MG tablet TAKE 1 TABLET BY MOUTH TWICE A DAY 08/16/20   Isaac Bliss, Rayford Halsted, MD  phenylephrine (SUDAFED PE) 10 MG TABS tablet Take 10 mg by mouth every 4 (four) hours as needed (for congestion).    [provider]    Allergies    Adhesive [tape], Benzoyl peroxide, and Milk-related compounds  Review of Systems   Review of Systems  Constitutional:  Negative for appetite change, chills and fever.  HENT:  Negative for ear pain, rhinorrhea, sneezing and  sore throat.   Eyes:  Negative for photophobia and visual disturbance.  Respiratory:  Negative for cough, chest tightness, shortness of breath and wheezing.   Cardiovascular:  Negative for chest pain and palpitations.  Gastrointestinal:  Negative for abdominal pain, blood in stool, constipation, diarrhea, nausea and vomiting.  Genitourinary:  Negative for dysuria, hematuria and urgency.  Musculoskeletal:  Negative for myalgias.  Skin:  Negative for rash.  Neurological:  Positive for dizziness. Negative for weakness and light-headedness.   Physical Exam Updated Vital Signs BP (!) 171/80 (BP Location: Left Arm)   Pulse 62   Temp 97.9 F (36.6 C) (Oral)   Resp 17   Ht 5\' 2"  (1.575 m)   Wt 113.4 kg   SpO2 100%   BMI 45.73 kg/m   Physical Exam Vitals and nursing note reviewed.  Constitutional:      General: She is not in acute distress.    Appearance: She is well-developed. She is obese.  HENT:     Head: Normocephalic and atraumatic.     Nose: Nose normal.  Eyes:     General: No scleral icterus.       Right eye: No discharge.        Left eye: No discharge.     Extraocular Movements: Extraocular movements intact.     Conjunctiva/sclera: Conjunctivae normal.     Pupils: Pupils are equal, round, and reactive to light.     Comments: Horizontal nystagmus noted bilaterally  Cardiovascular:     Rate and Rhythm: Normal rate and regular rhythm.     Heart sounds: Normal heart sounds. No murmur heard.   No friction rub.  No gallop.  Pulmonary:     Effort: Pulmonary effort is normal. No respiratory distress.     Breath sounds: Normal breath sounds.  Abdominal:     General: Bowel sounds are normal. There is no distension.     Palpations: Abdomen is soft.     Tenderness: There is no abdominal tenderness. There is no guarding.  Musculoskeletal:        General: Normal range of motion.     Cervical back: Normal range of motion and neck supple.  Skin:    General: Skin is warm and dry.     Findings: No rash.  Neurological:     Mental Status: She is alert and oriented to person, place, and time.     Cranial Nerves: No cranial nerve deficit.     Sensory: No sensory deficit.     Motor: No weakness or abnormal muscle tone.     Coordination: Coordination normal.     Comments: Pupils reactive. No facial asymmetry noted. Cranial nerves appear grossly intact. Sensation intact to light touch on face, BUE and BLE. Strength 5/5 in BUE and BLE.     ED Results / Procedures / Treatments   Labs (all labs ordered are listed, but only abnormal results are displayed) Labs Reviewed  CBC WITH DIFFERENTIAL/PLATELET - Abnormal; Notable for the following components:      Result Value   Lymphs Abs 0.5 (*)    All other components within normal limits  BASIC METABOLIC PANEL - Abnormal; Notable for the following components:   Sodium 133 (*)    Glucose, Bld 151 (*)    All other components within normal limits  MAGNESIUM  CBG MONITORING, ED    EKG EKG Interpretation  Date/Time:  Tuesday September 17 2020 14:15:44 EDT Ventricular Rate:  64 PR Interval:  196 QRS Duration: 92  QT Interval:  444 QTC Calculation: 458 R Axis:   73 Text Interpretation: Normal sinus rhythm Nonspecific ST abnormality Abnormal ECG Similar to previous Confirmed by Lavenia Atlas [phone]) on 09/17/2020 10:44:07 PM  Radiology MR BRAIN WO CONTRAST  Result Date: 09/17/2020 CLINICAL DATA:  Initial evaluation for acute vertigo. EXAM: MRI HEAD WITHOUT  CONTRAST TECHNIQUE: Multiplanar, multiecho pulse sequences of the brain and surrounding structures were obtained without intravenous contrast. COMPARISON:  None available. FINDINGS: Brain: Cerebral volume within normal limits for age. Scattered patchy T2/FLAIR hyperintensity noted within the periventricular and deep white matter both cerebral hemispheres, nonspecific, but most like related chronic microvascular ischemic disease, mild to moderate in nature. Probable DVA noted at the subcortical right frontal lobe (series 14, image 46). Associated T2/FLAIR signal intensity within this region likely reflects a degree of associated steal phenomenon (series 11, images 22, 21). No abnormal foci of restricted diffusion to suggest acute or subacute ischemia. Gray-white matter differentiation maintained. No encephalomalacia to suggest chronic cortical infarction. No evidence for acute or chronic intracranial hemorrhage. No mass lesion, midline shift or mass effect. No hydrocephalus or extra-axial fluid collection. Pituitary gland suprasellar region normal. Midline structures intact. Vascular: Major intracranial vascular flow voids are maintained. Skull and upper cervical spine: Craniocervical junction within normal limits. Bone marrow signal intensity normal. No scalp soft tissue abnormality. Sinuses/Orbits: Patient status post bilateral ocular lens replacement. Globes and orbital soft tissues demonstrate no other acute finding. Mild mucosal thickening noted within the ethmoidal air cells and maxillary sinuses. Paranasal sinuses are otherwise clear. Prominent bilateral mastoid effusions. Visualized nasopharynx unremarkable. Other: None. IMPRESSION: 1. No acute intracranial abnormality. 2. Mild-to-moderate chronic microvascular ischemic disease. 3. Probable DVA at the subcortical right frontal lobe. Associated T2/FLAIR signal intensity within this region likely reflects a degree of associated steal phenomenon. 4. Prominent  bilateral mastoid effusions. Correlation with physical exam recommended. Electronically Signed   By: Jeannine Boga M.D.   On: 09/17/2020 22:46    Procedures Procedures   Medications Ordered in ED Medications  diazepam (VALIUM) tablet 5 mg (has no administration in time range)  meclizine (ANTIVERT) tablet 25 mg (25 mg Oral Given 09/17/20 2127)  ondansetron (ZOFRAN) injection 4 mg (4 mg Intravenous Given 09/17/20 2127)    ED Course  I have reviewed the triage vital signs and the nursing notes.  Pertinent labs & imaging results that were available during my care of the patient were reviewed by me and considered in my medical decision making (see chart for details).  Clinical Course as of 09/17/20 2330  Tue Sep 17, 2020  2237 Magnesium: 2.0 [HK]  2237 Creatinine: 0.93 [HK]  2237 WBC: 4.7 [HK]  2237 Hemoglobin: 13.7 [HK]  2258 Spoke to neurologist, Dr. Leonel Ramsay regarding incidental MRI findings.  He states that this is not something that would cause the patient's symptoms today but that she will need to follow-up with neurosurgery for in the future as this is chronic. [HK]    Clinical Course User Index [HK] Delia Heady, PA-C   MDM Rules/Calculators/A&P                          60 year old female presenting to the ED for dizziness.  She woke up this morning around 6 AM in her usual state of health, made her husband's breakfast that she typically does and then went back to sleep.  Woke up again around 8:45 AM and noticed that she felt dizzy.  She felt the  room was spinning around her to the point that she had trouble standing up and fear that she would fall.  She has not fallen, denies any head injury, headache, vision changes, chest pain, neck stiffness or fever.  States that since 2001 has been dealing with "neuropathy throughout my entire body" and she has seen neurology several times in the past with several MRIs done without specific diagnosis.  This has been unchanged since  prior.  No history of prior stroke, bleed or mass. On exam patient without any facial asymmetry.  She has normal strength in bilateral upper and lower extremities. Intact sensation to light touch bilaterally.  She does have horizontal nystagmus noted.  She has hypertensive here has a history of hypertension and is currently on metoprolol which she took today.  Pupils are equal and reactive.  CBC, BMP and magnesium unremarkable. EKG shows normal sinus rhythm, no changes from prior tracings, no STEMI. MRI shows no acute findings, shows an incidental DVA with chronic microvascular ischemic disease.  Per neurology recommendation she will need to follow-up with neurosurgery about this but this should not be causing her symptoms.  On recheck patient reports some improvement but reports still feels dizzy when sitting up slowly. Will attempt additional medications, and give food/drink to see if this helps with symptoms as well.  Care handed off to oncoming provider pending recheck.  Suspect the patient will be able to be discharged home if symptoms improve. Will rx meclizine.   Portions of this note were generated with [employer]. Dictation errors may occur despite best attempts at proofreading.   Final Clinical Impression(s) / ED Diagnoses Final diagnoses:  Vertigo  Developmental venous anomaly    Rx / DC Orders ED Discharge Orders          Ordered    meclizine (ANTIVERT) 25 MG tablet  3 times daily PRN        09/17/20 2324             Delia Heady, PA-C 09/17/20 2331    Lorelle Gibbs, DO 09/18/20 2343

## 2020-09-17 NOTE — ED Notes (Signed)
Pt provided food and drink and encouraged to eat

## 2020-09-17 NOTE — ED Notes (Signed)
Patient transported to MRI 

## 2020-09-17 NOTE — ED Provider Notes (Signed)
Emergency Medicine Provider Triage Evaluation Note  Mary Reyes , a 60 y.o. female  was evaluated in triage.  Pt complains of positional dizziness since this morning around 9:00.  States she woke up early to make her family's lunch, got back to bed and when she woke around 845 felt the room was moving.  Dizziness is exacerbated with sitting upright or standing, with resulting nausea and dry heaving without emesis.  She denies any blurry vision or double vision, denies any headache, chest pain, shortness of breath, palpitations, nausea, vomiting.  Review of Systems  Positive: Dizziness with sitting upright, nausea Negative: Vomiting, diarrhea, fevers, chills, blurry vision, headache, chest pain, shortness of breath  Physical Exam  BP (!) 175/78 (BP Location: Left Wrist)   Pulse 71   Temp 97.8 F (36.6 C) (Oral)   Resp 16   Ht 5\' 2"  (1.575 m)   Wt 113.4 kg   SpO2 94%   BMI 45.73 kg/m  Gen:   Awake, no distress lying flat Resp:  Normal effort  MSK:   Moves extremities without difficulty  Other:  Cranial nerves are intact, PERRL, EOMI, symmetric strength and sensation upper and lower extremities bilaterally.  Patient able to rotate her head to the left to the right without exacerbation of her dizziness, she was unwilling to attempt to sit upright.  Medical Decision Making  Medically screening exam initiated at 2:18 PM.  Appropriate orders placed.  [name] was informed that the remainder of the evaluation will be completed by another provider, this initial triage assessment does not replace that evaluation, and the importance of remaining in the ED until their evaluation is complete.  Well-appearing when lying flat, no focal deficit on neurologic exam.  We will proceed with basic labs and EKG.  Antiemetic offered, patient declined.  This chart was dictated using voice recognition software, Dragon. Despite the best efforts of this provider to proofread and correct errors,  errors may still occur which can change documentation meaning.    Aura Dials 09/17/20 1424    Noemi Chapel, MD 09/18/20 1031

## 2020-09-17 NOTE — ED Triage Notes (Signed)
Pt from home with ems c.o dizziness and nausea upon sitting or standing, starting arounf 845 this morning. States she feels fine when lying flat. Denies headache or vision changes. Stroke screen negative with ems. Pt a.o. Initially hypertensive 210/120, now 172/74 HR 68 CBG 128.

## 2020-09-17 NOTE — ED Provider Notes (Signed)
60 year old female received as signout from [facility] pending re-evaluation after the patient has been medicated.  Per her HPI:  "Mary Reyes is a 60 y.o. female with a past medical history of hypertension, anxiety, arthritis, hypothyroidism presenting to the ED with a chief complaint of dizziness.  States that she woke up this morning around 6 AM in her usual state of health.  She may breakfast for her husband as she typically does and then went back to sleep.  She woke up again around 8:45 AM and noticed that she had felt dizzy.  Felt like the room was spinning around her.  She had trouble standing up and ambulating as she was concerned that she may fall due to the dizziness.  This is worse when she tries to move her head and improves when she lays down.  Denies any vision changes, headache, injuries or falls, chest pain, neck stiffness or fever.  Patient states that since 2001 she has been to the neurologist several times due to "neuropathy throughout my entire body, I don't feel pain like everyone else does. But that runs in my family. They've done so many labs and MRIs but they haven't been able to put a name on what I have."  The symptoms have been unchanged.  No history of prior stroke, bleed or mass.  Last MRI was in 2019."  On my evaluation, patient reports that she has been having intermittent room spinning dizziness since this morning.  Dizziness is brought on by sitting upright, moving her head.  She reports that when she attempts to sit up or try to stand that she becomes very nauseated and earlier was having active retching and vomiting.  She denies tenderness, hearing loss or decreased hearing, otalgia, facial swelling, sinus pain or pressure, headache, visual changes, falls, or syncope.  Family reports that she ambulates slowly and has gait instability at baseline.  They have to lift chairs in their home.  They also have a wheelchair.   Physical Exam  BP (!) 171/80 (BP Location: Left  Arm)   Pulse 62   Temp 97.9 F (36.6 C) (Oral)   Resp 17   Ht 5\' 2"  (1.575 m)   Wt 113.4 kg   SpO2 100%   BMI 45.73 kg/m   Physical Exam Vitals and nursing note reviewed.  Constitutional:      General: She is not in acute distress.    Appearance: She is obese.  HENT:     Head: Normocephalic.     Right Ear: Hearing normal. A middle ear effusion is present.     Left Ear: Hearing normal. A middle ear effusion is present. No mastoid tenderness.     Ears:     Comments: No mastoid tenderness or swelling bilaterally. Eyes:     Conjunctiva/sclera: Conjunctivae normal.  Cardiovascular:     Rate and Rhythm: Normal rate and regular rhythm.     Heart sounds: No murmur heard.   No friction rub. No gallop.  Pulmonary:     Effort: Pulmonary effort is normal. No respiratory distress.  Abdominal:     General: There is no distension.     Palpations: Abdomen is soft.  Musculoskeletal:     Cervical back: Neck supple.  Skin:    General: Skin is warm.     Findings: No rash.  Neurological:     Mental Status: She is alert.     Comments: Ambulates with a slow, steady gait.  No ataxia.  GCS  15.  Moves all 4 extremities spontaneously.  Good strength against resistance  Psychiatric:        Behavior: Behavior normal.    ED Course/Procedures   Clinical Course as of 09/18/20 0257  Tue Sep 17, 2020  2237 Magnesium: 2.0 [HK]  2237 Creatinine: 0.93 [HK]  2237 WBC: 4.7 [HK]  2237 Hemoglobin: 13.7 [HK]  2258 Spoke to neurologist, Dr. Leonel Ramsay regarding incidental MRI findings.  He states that this is not something that would cause the patient's symptoms today but that she will need to follow-up with neurosurgery for in the future as this is chronic. [HK]    Clinical Course User Index [HK] Delia Heady, PA-C    Procedures  MDM   60 year old female received in signout from [facility] pending reevaluation after the patient has received medications.  In brief, this is a patient with  dizziness, onset today, nausea, and gait instability.  Please see her note for further work-up and medical decision making.  Vital signs are stable.  MRI has been reviewed by me.  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  Patient reports that she has previously been told by a neurologist that does exist.  She will be given a referral to neurosurgery.  MRI also with prominent bilateral mastoid effusions.  On exam, she does have bilateral middle ear effusions, but no mastoid tenderness or bogginess.  Suspect this is chronic, but after discussing with Dr. Betsey Holiday, attending physician, will cover her with Augmentin.   After patient received Valium, no improvement in her symptoms.  She does also report that she has a history of atypical headaches and migraines.  Although she is denying a headache, will give headache cocktail and reassess.  On reevaluation, patient is now able to sit upright in bed.  She reports that dizziness subsides as long as she does not move her head.  She is able to ambulate to the restroom.  Family reports that that she is close to her baseline ability to ambulate.  Shared decision-making conversation with the patient and family.  Since they have multiple lift chairs and wheelchairs at home, they feel comfortable going home with outpatient follow-up to ENT and neurosurgery.  ER return precautions given.  She is hemodynamically stable and in no acute distress.  Safe discharge to home with outpatient follow-up as discussed.     Joanne Gavel, PA-C 09/18/20 0303    Merryl Hacker, MD 09/18/20 [phone]

## 2020-09-18 MED ORDER — SODIUM CHLORIDE 0.9 % IV BOLUS
500.0000 mL | Freq: Once | INTRAVENOUS | Status: AC
  Administered 2020-09-18: 500 mL via INTRAVENOUS

## 2020-09-18 MED ORDER — ONDANSETRON 4 MG PO TBDP: 4.0000 mg | ORAL_TABLET | Freq: Three times a day (TID) | ORAL | 0 refills | Status: DC | PRN

## 2020-09-18 MED ORDER — KETOROLAC TROMETHAMINE 30 MG/ML IJ SOLN
30.0000 mg | Freq: Once | INTRAMUSCULAR | Status: AC
  Administered 2020-09-18: 30 mg via INTRAVENOUS
  Filled 2020-09-18: qty 1

## 2020-09-18 MED ORDER — PROCHLORPERAZINE EDISYLATE 10 MG/2ML IJ SOLN
10.0000 mg | Freq: Once | INTRAMUSCULAR | Status: AC
  Administered 2020-09-18: 10 mg via INTRAVENOUS
  Filled 2020-09-18: qty 2

## 2020-09-18 MED ORDER — DIPHENHYDRAMINE HCL 50 MG/ML IJ SOLN
25.0000 mg | Freq: Once | INTRAMUSCULAR | Status: AC
  Administered 2020-09-18: 25 mg via INTRAVENOUS
  Filled 2020-09-18: qty 1

## 2020-09-18 MED ORDER — AMOXICILLIN-POT CLAVULANATE 875-125 MG PO TABS: 1.0000 | ORAL_TABLET | Freq: Two times a day (BID) | ORAL | 0 refills | Status: DC

## 2020-09-18 NOTE — ED Notes (Signed)
Pt able ambulate with assistance without incident

## 2020-09-24 ENCOUNTER — Telehealth (INDEPENDENT_AMBULATORY_CARE_PROVIDER_SITE_OTHER): Payer: BC Managed Care – PPO | Admitting: Family Medicine

## 2020-09-24 DIAGNOSIS — I10 Essential (primary) hypertension: Secondary | ICD-10-CM | POA: Diagnosis not present

## 2020-09-24 NOTE — Patient Instructions (Signed)
Take the metoprolol as prescribe on a regular basis. Do not miss doses.  Schedule close follow up inperson with Dr. Jerilee Hoh or her office.  Monitor BP at home and keep a log. The goal is 120/70.  I hope you are feeling better soon!  Seek in person care promptly if your BP continues to run high, symptoms worsen, new concerns arise or you are not improving with treatment.  It was nice to meet you today. I help [facility] out with telemedicine visits on Tuesdays and Thursdays and am available for visits on those days. If you have any concerns or questions following this visit please schedule a follow up visit with your Primary Care doctor or seek care at a local urgent care clinic to avoid delays in care.

## 2020-09-24 NOTE — Progress Notes (Signed)
Virtual Visit via Video Note  I connected with Mary Reyes  on 09/24/20 at  5:40 PM EDT by a video enabled telemedicine application and verified that I am speaking with the correct person using two identifiers.  Location patient: home, [place] Location provider:work or home office Persons participating in the virtual visit: patient, provider  I discussed the limitations of evaluation and management by telemedicine and the availability of in person appointments. The patient expressed understanding and agreed to proceed.   HPI:  Acute telemedicine visit for BP issues: -reports hx of HTN but reports she had not been monitoring it and when seen for ear infection in ER recently BP was high (150s-180s) - reports she was told to follow up with her PCP -was on hctz in the past, but had issues with sodium too low; now on metoprolol 25 mg but reports she had not been taking it most days though and often missed doses due to not taking with meal then would skip -report resting HR in 60-70s -Denies CP, SOB, headache -she is seeing a neurosurgeon tomorrow for a possible abnormal MRA in the ER  ROS: See pertinent positives and negatives per HPI.  Past Medical History:  Diagnosis Date   Allergy    Anxiety    Arthritis    Blood in stool    Colon polyp    Depression    Eczema    Frequent headaches    GERD (gastroesophageal reflux disease)    Hypertension    Hypothyroidism    Migraine    OSA (obstructive sleep apnea)    CPAP nightly   Osteopenia    Sleep apnea    Thyroid disease    Trimalleolar fracture of ankle, closed, left, sequela     Past Surgical History:  Procedure Laterality Date   ABDOMINAL HYSTERECTOMY  2004   ORIF ANKLE FRACTURE Left 02/12/2016   Procedure: OPEN REDUCTION INTERNAL FIXATION (ORIF) LEFT ANKLE FRACTURE;  Surgeon: Leandrew Koyanagi, MD;  Location: [facility];  Service: Orthopedics;  Laterality: Left;   WISDOM TOOTH EXTRACTION       Current Outpatient  Medications:    ALPRAZolam (XANAX) 0.25 MG tablet, Take 0.25 mg by mouth at bedtime as needed for anxiety or sleep. , Disp: , Rfl: 0   amoxicillin-clavulanate (AUGMENTIN) 875-125 MG tablet, Take 1 tablet by mouth every 12 (twelve) hours., Disp: 14 tablet, Rfl: 0   ARIPiprazole (ABILIFY) 2 MG tablet, Take 2 mg by mouth daily., Disp: , Rfl:    Azelastine HCl 0.15 % SOLN, Place into the nose., Disp: , Rfl:    buPROPion (WELLBUTRIN XL) 300 MG 24 hr tablet, Take 300 mg by mouth every morning., Disp: , Rfl:    calcium-vitamin D (OSCAL WITH D) 500-200 MG-UNIT tablet, Take 1 tablet by mouth 3 (three) times daily., Disp: 90 tablet, Rfl: 12   cholecalciferol (VITAMIN D) 1000 units tablet, Take 2,000 Units by mouth at bedtime., Disp: , Rfl:    cyclobenzaprine (FLEXERIL) 10 MG tablet, Take 1 tablet (10 mg total) by mouth 3 (three) times daily as needed for muscle spasms., Disp: 30 tablet, Rfl: 1   desvenlafaxine (PRISTIQ) 100 MG 24 hr tablet, Take 100 mg by mouth every morning., Disp: , Rfl:    famotidine-calcium carbonate-magnesium hydroxide (PEPCID COMPLETE) 10-800-165 MG chewable tablet, Chew 1 tablet by mouth daily as needed., Disp: , Rfl:    fexofenadine (ALLEGRA) 180 MG tablet, Take by mouth., Disp: , Rfl:    fluticasone (FLONASE) 50 MCG/ACT  nasal spray, Place into both nostrils daily., Disp: , Rfl:    hydrochlorothiazide (HYDRODIURIL) 25 MG tablet, TAKE 1 TABLET BY MOUTH EVERY DAY (Patient not taking: Reported on 07/09/2020), Disp: 90 tablet, Rfl: 1   ibuprofen (ADVIL,MOTRIN) 200 MG tablet, Take 400 mg by mouth every 6 (six) hours as needed for headache, mild pain or moderate pain., Disp: , Rfl:    levothyroxine (SYNTHROID) 50 MCG tablet, TAKE 1 TABLET BY MOUTH EVERY MORNING MONDAY-SATURDAY AND TAKE 2 TABLETS BY MOUTH ON SUNDAY, Disp: 105 tablet, Rfl: 3   liothyronine (CYTOMEL) 5 MCG tablet, TAKE 1 AND 1/2 TABLET BY MOUTH ONCE DAILY, Disp: 135 tablet, Rfl: 3   meclizine (ANTIVERT) 25 MG tablet, Take 1  tablet (25 mg total) by mouth 3 (three) times daily as needed for dizziness., Disp: 30 tablet, Rfl: 0   metoprolol tartrate (LOPRESSOR) 25 MG tablet, TAKE 1 TABLET BY MOUTH TWICE A DAY, Disp: 180 tablet, Rfl: 1   ondansetron (ZOFRAN ODT) 4 MG disintegrating tablet, Take 1 tablet (4 mg total) by mouth every 8 (eight) hours as needed., Disp: 20 tablet, Rfl: 0   phenylephrine (SUDAFED PE) 10 MG TABS tablet, Take 10 mg by mouth every 4 (four) hours as needed (for congestion)., Disp: , Rfl:   EXAM:  VITALS per patient if applicable:  GENERAL: alert, oriented, appears well and in no acute distress  HEENT: atraumatic, conjunttiva clear, no obvious abnormalities on inspection of external nose and ears  NECK: normal movements of the head and neck  LUNGS: on inspection no signs of respiratory distress, breathing rate appears normal, no obvious gross SOB, gasping or wheezing  CV: no obvious cyanosis  MS: moves all visible extremities without noticeable abnormality  PSYCH/NEURO: pleasant and cooperative, no obvious depression or anxiety, speech and thought processing grossly intact  ASSESSMENT AND PLAN:  Discussed the following assessment and plan:  Hypertension, unspecified type  -we discussed possible serious and likely etiologies, options for evaluation and workup, limitations of telemedicine visit vs in person visit, treatment, treatment risks and precautions. Pt prefers to treat via telemedicine empirically rather than in person at this moment. We discussed different treatment options for HTN and opted to try taking the metoprolol on a regular basis every day with close follow up with PCP. She also agrees to monitor BP at home and bring cuff and log to follow up with PCP. She sees neuro specialist tomorrow per her report. Will send notes and message to PCP office to assist with close follow up and to keep PCP in loop. Scheduled follow up with PCP offered:Sent message to schedulers to assist  and advised patient to contact PCP office to schedule if does not receive call back in next 24 hours. Advised to seek prompt in person care if worsening, new symptoms arise, or if is not improving with treatment. Discussed options for inperson care if PCP office not available. Did let this patient know that I only do telemedicine on Tuesdays and Thursdays for [facility]. Advised to schedule follow up visit with PCP or [facility] if any further questions or concerns to avoid delays in care.   I discussed the assessment and treatment plan with the patient. The patient was provided an opportunity to ask questions and all were answered. The patient agreed with the plan and demonstrated an understanding of the instructions.     Lucretia Kern, DO

## 2020-09-25 DIAGNOSIS — Q283 Other malformations of cerebral vessels: Secondary | ICD-10-CM | POA: Diagnosis not present

## 2020-09-25 NOTE — Progress Notes (Signed)
Patient scheduled for 10/15/2020 at 10 AM with Dr. Jerilee Hoh

## 2020-09-26 DIAGNOSIS — G4733 Obstructive sleep apnea (adult) (pediatric): Secondary | ICD-10-CM | POA: Diagnosis not present

## 2020-10-08 DIAGNOSIS — R42 Dizziness and giddiness: Secondary | ICD-10-CM | POA: Diagnosis not present

## 2020-10-14 ENCOUNTER — Other Ambulatory Visit: Payer: Self-pay

## 2020-10-15 ENCOUNTER — Encounter: Payer: Self-pay | Admitting: Internal Medicine

## 2020-10-15 ENCOUNTER — Ambulatory Visit (INDEPENDENT_AMBULATORY_CARE_PROVIDER_SITE_OTHER): Payer: BC Managed Care – PPO | Admitting: Internal Medicine

## 2020-10-15 VITALS — BP 146/86 | HR 109 | Temp 98.2°F | Wt 248.1 lb

## 2020-10-15 DIAGNOSIS — Z23 Encounter for immunization: Secondary | ICD-10-CM | POA: Diagnosis not present

## 2020-10-15 DIAGNOSIS — I10 Essential (primary) hypertension: Secondary | ICD-10-CM

## 2020-10-15 NOTE — Patient Instructions (Addendum)
-  Nice seeing you today!!  -Make sure you are taking metoprolol twice daily.  -Second shingles vaccine today/  -Schedule follow up in 3 months but touch base with me in 1 month with your updated BP measurements.

## 2020-10-15 NOTE — Progress Notes (Deleted)
Per orders from Dr Jerilee Hoh, pt was given Shingles vaccine, pt tolerated well

## 2020-10-15 NOTE — Addendum Note (Signed)
Addended by: Anderson Malta on: 10/15/2020 10:52 AM   Modules accepted: Orders

## 2020-10-15 NOTE — Progress Notes (Signed)
Established Patient Office Visit     This visit occurred during the SARS-CoV-2 public health emergency.  Safety protocols were in place, including screening questions prior to the visit, additional usage of staff PPE, and extensive cleaning of exam room while observing appropriate contact time as indicated for disinfecting solutions.    CC/Reason for Visit: Follow-up blood pressure  HPI: Mary Reyes is a 60 y.o. female who is coming in today for the above mentioned reasons.  Since I last saw her she had to visit the emergency department for vertigo that is improved.  While there she was noted to have elevated blood pressure.  She does have a history of hypertension but had not been 100% compliant with her metoprolol or with ambulatory measurements.  Since then she has been taking her metoprolol twice daily and has been checking blood pressure at home, which has been around the 1 72-0 50 systolic to upper 80 diastolic range.  She has no acute complaints today.  She is due for her second shingles vaccine.  She had been tried on hydrochlorothiazide but had to discontinue due to hyponatremia.  Past Medical/Surgical History: Past Medical History:  Diagnosis Date   Allergy    Anxiety    Arthritis    Blood in stool    Colon polyp    Depression    Eczema    Frequent headaches    GERD (gastroesophageal reflux disease)    Hypertension    Hypothyroidism    Migraine    OSA (obstructive sleep apnea)    CPAP nightly   Osteopenia    Sleep apnea    Thyroid disease    Trimalleolar fracture of ankle, closed, left, sequela     Past Surgical History:  Procedure Laterality Date   ABDOMINAL HYSTERECTOMY  2004   ORIF ANKLE FRACTURE Left 02/12/2016   Procedure: OPEN REDUCTION INTERNAL FIXATION (ORIF) LEFT ANKLE FRACTURE;  Surgeon: Leandrew Koyanagi, MD;  Location: [facility];  Service: Orthopedics;  Laterality: Left;   WISDOM TOOTH EXTRACTION      Social History:  reports  that she has never smoked. She has never used smokeless tobacco. She reports that she does not drink alcohol and does not use drugs.  Allergies: Allergies  Allergen Reactions   Adhesive [Tape] Rash   Benzoyl Peroxide Rash    Benzine    Milk-Related Compounds Rash    Family History:  Family History  Problem Relation Age of Onset   Healthy Mother    Alcohol abuse Father        Deceased   Stroke Maternal Grandmother    Arthritis Paternal Grandmother    Depression Daughter    Heart murmur Daughter    Allergic rhinitis Daughter    Fibromyalgia Daughter    Allergic rhinitis Daughter    Migraines Son    Allergic rhinitis Son    Brain cancer Sister    Asthma Sister    Angioedema Neg Hx    Eczema Neg Hx    Immunodeficiency Neg Hx    Urticaria Neg Hx    Colon cancer Neg Hx      Current Outpatient Medications:    ALPRAZolam (XANAX) 0.25 MG tablet, Take 0.25 mg by mouth at bedtime as needed for anxiety or sleep. , Disp: , Rfl: 0   ARIPiprazole (ABILIFY) 2 MG tablet, Take 2 mg by mouth daily., Disp: , Rfl:    Azelastine HCl 0.15 % SOLN, Place into the nose., Disp: ,  Rfl:    buPROPion (WELLBUTRIN XL) 300 MG 24 hr tablet, Take 300 mg by mouth every morning., Disp: , Rfl:    calcium-vitamin D (OSCAL WITH D) 500-200 MG-UNIT tablet, Take 1 tablet by mouth 3 (three) times daily., Disp: 90 tablet, Rfl: 12   cholecalciferol (VITAMIN D) 1000 units tablet, Take 2,000 Units by mouth at bedtime., Disp: , Rfl:    cyclobenzaprine (FLEXERIL) 10 MG tablet, Take 1 tablet (10 mg total) by mouth 3 (three) times daily as needed for muscle spasms., Disp: 30 tablet, Rfl: 1   desvenlafaxine (PRISTIQ) 100 MG 24 hr tablet, Take 100 mg by mouth every morning., Disp: , Rfl:    famotidine-calcium carbonate-magnesium hydroxide (PEPCID COMPLETE) 10-800-165 MG chewable tablet, Chew 1 tablet by mouth daily as needed., Disp: , Rfl:    fexofenadine (ALLEGRA) 180 MG tablet, Take by mouth., Disp: , Rfl:    fluticasone  (FLONASE) 50 MCG/ACT nasal spray, Place into both nostrils daily., Disp: , Rfl:    ibuprofen (ADVIL,MOTRIN) 200 MG tablet, Take 400 mg by mouth every 6 (six) hours as needed for headache, mild pain or moderate pain., Disp: , Rfl:    levothyroxine (SYNTHROID) 50 MCG tablet, TAKE 1 TABLET BY MOUTH EVERY MORNING MONDAY-SATURDAY AND TAKE 2 TABLETS BY MOUTH ON SUNDAY, Disp: 105 tablet, Rfl: 3   liothyronine (CYTOMEL) 5 MCG tablet, TAKE 1 AND 1/2 TABLET BY MOUTH ONCE DAILY, Disp: 135 tablet, Rfl: 3   metoprolol tartrate (LOPRESSOR) 25 MG tablet, TAKE 1 TABLET BY MOUTH TWICE A DAY, Disp: 180 tablet, Rfl: 1   phenylephrine (SUDAFED PE) 10 MG TABS tablet, Take 10 mg by mouth every 4 (four) hours as needed (for congestion)., Disp: , Rfl:    amoxicillin-clavulanate (AUGMENTIN) 875-125 MG tablet, Take 1 tablet by mouth every 12 (twelve) hours. (Patient not taking: Reported on 10/15/2020), Disp: 14 tablet, Rfl: 0   hydrochlorothiazide (HYDRODIURIL) 25 MG tablet, TAKE 1 TABLET BY MOUTH EVERY DAY (Patient not taking: No sig reported), Disp: 90 tablet, Rfl: 1   meclizine (ANTIVERT) 25 MG tablet, Take 1 tablet (25 mg total) by mouth 3 (three) times daily as needed for dizziness. (Patient not taking: Reported on 10/15/2020), Disp: 30 tablet, Rfl: 0   ondansetron (ZOFRAN ODT) 4 MG disintegrating tablet, Take 1 tablet (4 mg total) by mouth every 8 (eight) hours as needed. (Patient not taking: Reported on 10/15/2020), Disp: 20 tablet, Rfl: 0  Review of Systems:  Constitutional: Denies fever, chills, diaphoresis, appetite change and fatigue.  HEENT: Denies photophobia, eye pain, redness, hearing loss, ear pain, congestion, sore throat, rhinorrhea, sneezing, mouth sores, trouble swallowing, neck pain, neck stiffness and tinnitus.   Respiratory: Denies SOB, DOE, cough, chest tightness,  and wheezing.   Cardiovascular: Denies chest pain, palpitations and leg swelling.  Gastrointestinal: Denies nausea, vomiting, abdominal pain,  diarrhea, constipation, blood in stool and abdominal distention.  Genitourinary: Denies dysuria, urgency, frequency, hematuria, flank pain and difficulty urinating.  Endocrine: Denies: hot or cold intolerance, sweats, changes in hair or nails, polyuria, polydipsia. Musculoskeletal: Denies myalgias, back pain, joint swelling, arthralgias and gait problem.  Skin: Denies pallor, rash and wound.  Neurological: Denies dizziness, seizures, syncope, weakness, light-headedness, numbness and headaches.  Hematological: Denies adenopathy. Easy bruising, personal or family bleeding history  Psychiatric/Behavioral: Denies suicidal ideation, mood changes, confusion, nervousness, sleep disturbance and agitation    Physical Exam: Vitals:   10/15/20 0952  BP: (!) 146/86  Pulse: (!) 109  Temp: 98.2 F (36.8 C)  TempSrc: Oral  SpO2: 97%  Weight: 248 lb 1.6 oz (112.5 kg)    Body mass index is 45.38 kg/m.   Constitutional: NAD, calm, comfortable, obese Eyes: PERRL, lids and conjunctivae normal, wears corrective lenses ENMT: Mucous membranes are moist.  Respiratory: clear to auscultation bilaterally, no wheezing, no crackles. Normal respiratory effort. No accessory muscle use.  Cardiovascular: Regular rate and rhythm, no murmurs / rubs / gallops. No extremity edema.  Neurologic: Grossly intact and nonfocal Psychiatric: Normal judgment and insight. Alert and oriented x 3. Normal mood.    Impression and Plan:  Essential hypertension -Blood pressure remains elevated, however she has only been taking metoprolol correctly for about 3 weeks. -She will check in with me in another 3 weeks via MyChart with updated blood pressure measurements to see if antihypertensive regimen needs to be augmented.  Need for shingles vaccine -Second shingles vaccine administered today.  Time spent: 22 minutes reviewing chart, interviewing and examining patient and formulating plan of care.   Patient Instructions   -Nice seeing you today!!  -Make sure you are taking metoprolol twice daily.  -Second shingles vaccine today/  -Schedule follow up in 3 months but touch base with me in 1 month with your updated BP measurements.   Lelon Frohlich, MD [facility]

## 2020-10-17 ENCOUNTER — Encounter: Payer: Self-pay | Admitting: Internal Medicine

## 2020-10-17 ENCOUNTER — Other Ambulatory Visit: Payer: Self-pay

## 2020-10-17 ENCOUNTER — Ambulatory Visit (INDEPENDENT_AMBULATORY_CARE_PROVIDER_SITE_OTHER): Payer: BC Managed Care – PPO | Admitting: Internal Medicine

## 2020-10-17 ENCOUNTER — Other Ambulatory Visit: Payer: Self-pay | Admitting: Internal Medicine

## 2020-10-17 VITALS — BP 140/70 | HR 62 | Ht 62.0 in | Wt 248.2 lb

## 2020-10-17 DIAGNOSIS — E039 Hypothyroidism, unspecified: Secondary | ICD-10-CM | POA: Diagnosis not present

## 2020-10-17 DIAGNOSIS — E559 Vitamin D deficiency, unspecified: Secondary | ICD-10-CM

## 2020-10-17 DIAGNOSIS — M8580 Other specified disorders of bone density and structure, unspecified site: Secondary | ICD-10-CM | POA: Diagnosis not present

## 2020-10-17 DIAGNOSIS — Z1589 Genetic susceptibility to other disease: Secondary | ICD-10-CM

## 2020-10-17 NOTE — Progress Notes (Signed)
Patient ID: Mary Reyes, female   DOB: 02-Jul-1960, 60 y.o.   MRN: [id number]  This visit occurred during the SARS-CoV-2 public health emergency.  Safety protocols were in place, including screening questions prior to the visit, additional usage of staff PPE, and extensive cleaning of exam room while observing appropriate contact time as indicated for disinfecting solutions.   HPI  Mary Reyes is a 60 y.o.-year-old female, returning for f/u for hypothyroidism, vitamin D deficiency and osteopenia. Last visit 1 year ago.  Interim history: She had vertigo 08/2020 - improved but not resolved.  She saw ENT who diagnosed vestibular neuritis.  Her nausea resolved. Bood pressure was worse recently >> on Metoprolol, off HCTZ 2/2 hyponatremia. She continues to feel tired. Her acid reflux is better. She is not exercising.  Hypothyroidism: Reviewed history: She tells me that her thyroid problem started when she found a "lump in her neck" in 2001. She saw ENT at that time, who was following the nodule, which kept growing. She remembers that she was then dx with Mononucleosis. She had a FNA of the nodule and this apparently returned positive for lymphoid tissue. It was considered that this was an enlarged lymph node possibly from mononucleosis. She did not have a thyroid U/S after this episode. She remembers that the nodule did decrease in size after prednisone.  Patient was found to be hyperthyroid during the investigation for her enlarged lymph node. She initially tried oral anti-thyroid medicines, which were not effective. She had RAI tx in 2002. She did not have the start LT4 after her RAI Tx, but became hypothyroid and had to start levothyroxine in 2008. She is not sure why she was recommended to add LT3, but she believes that it was added because of her history of depression.   She is on levothyroxine 50 mcg 6 out of 7 days and 100 mcg 1 out of 7 days + liothyronine 7.5 mcg daily: - in am -  fasting - at least 30 min from b'fast - + Ca, PPIs later in the day - no Fe, MVIs - not on Biotin  Reviewed her TFTs: Lab Results  Component Value Date   TSH 0.87 07/09/2020   TSH 1.53 01/10/2020   TSH 0.99 07/06/2019   TSH 1.40 10/20/2018   TSH 1.71 10/26/2017   FREET4 0.64 10/20/2018   FREET4 0.66 10/26/2017   FREET4 0.72 09/08/2016   FREET4 0.84 02/03/2016   FREET4 0.70 01/16/2015  04/03/2014: TSH 0.92, fT4 0.97, fT3 2.8  Pt denies: - feeling nodules in neck - hoarseness - dysphagia - choking - SOB with lying down  She has + FH of thyroid disorders in: thyroiditis. No FH of thyroid cancer. + h/o radiation tx to head or neck.  No herbal supplements. No recent steroids use.   Vitamin D deficiency: Previously on ergocalciferol, but more recently on 2000 units vitamin D3 daily.  Reviewed vitamin D levels: Lab Results  Component Value Date   VD25OH 41.96 07/09/2020   VD25OH 37 01/10/2020   VD25OH 44.77 07/06/2019   VD25OH 47.21 10/20/2018   VD25OH 43.35 10/26/2017   VD25OH 43.80 11/03/2016   VD25OH 42.23 02/03/2016   VD25OH 45.34 07/20/2014   Osteopenia:  Reviewed the available DXA scan reports: 10/29/2019 ([facility]) Lumbar spine L1-L4 Femoral neck (FN) 33% distal radius  T-score -1.5 RFN: -1.7 LFN: -1.5 n/a  Change in BMD from previous DXA test (%) Up 3.5% Down 9.1% n/a  (*) statistically significant  - DXA scan  11/10/2016: L1-L4: T -1.7 RFN: T -1.1 LFN: T -1.0  FRAX MOF risk: 9.4%, Hip fx risk 0.5% - DXA scan 09/05/2014: L1-L4: T -1.6 RFN: T -1.3 LFN: T -1.0 FRAX MOF risk: 5.1%, Hip fx risk 0.3% -She had a left ankle fracture in 01/2016.  She had to have surgery for this.  No fractures since then. -No previous antiresorptive therapy.  She had dysesthesia and I checked her for MTHFR mutation in the past.  This returned positive for:  Component     Latest Ref Rng & Units 12/11/2015  MTHFR      C677T Single mutation (C677T) identified  Vitamin  B12     211 - 911 pg/mL 447  Methylmalonic Acid, Quant     87 - 318 nmol/L 111  Vitamin B12 and MMA normal.  Pt is heterozygous for the C667T MTHFR mutation >> this is associated with an approximately 30% decrease in methylation activity.  After the results returned, I called and discussed with the geneticist at [facility], recommended to check a homocystine. If this is high, this could be treated, but otherwise, no intervention is necessary.  Homocystein was high, so we discussed about taking her methylated vitamin B consistently.   Latest homocystine level finally normalized: Component     Latest Ref Rng & Units 11/28/2019  Homocysteine     <10.4 umol/L 7.7   Component     Latest Ref Rng & Units 11/03/2016 10/26/2017 10/20/2018  Homocysteine     <10.4 umol/L 10.7 (H) 11.4 (H) 11.6 (H)   Previous endocrinologist Dr. Abel Presto [phone] - in [place]. She also has a history of ?MS, GERD, HTN, OSA, migraines. She has a history of elevated HbA1c: Lab Results  Component Value Date   HGBA1C 5.8 07/09/2020   HGBA1C 5.5 07/06/2019   Father had Lieber's ds. (adult onset blindness in men) Her younger sister has Lewy body dementia. She is doing reasonably well.  ROS: + See HPI  Past Medical History:  Diagnosis Date   Allergy    Anxiety    Arthritis    Blood in stool    Colon polyp    Depression    Eczema    Frequent headaches    GERD (gastroesophageal reflux disease)    Hypertension    Hypothyroidism    Migraine    OSA (obstructive sleep apnea)    CPAP nightly   Osteopenia    Sleep apnea    Thyroid disease    Trimalleolar fracture of ankle, closed, left, sequela    Past Surgical History:  Procedure Laterality Date   ABDOMINAL HYSTERECTOMY  2004   ORIF ANKLE FRACTURE Left 02/12/2016   Procedure: OPEN REDUCTION INTERNAL FIXATION (ORIF) LEFT ANKLE FRACTURE;  Surgeon: Leandrew Koyanagi, MD;  Location: [place];  Service: Orthopedics;  Laterality: Left;   WISDOM  TOOTH EXTRACTION     History   Social History   Marital Status: Married    Spouse Name: N/A   Number of Children:  3    Occupational History   N/a   Social History Main Topics   Smoking status: Never Smoker    Smokeless tobacco: Not on file   Alcohol Use: No   Drug Use: No   Current Outpatient Medications on File Prior to Visit  Medication Sig Dispense Refill   ALPRAZolam (XANAX) 0.25 MG tablet Take 0.25 mg by mouth at bedtime as needed for anxiety or sleep.   0   amoxicillin-clavulanate (AUGMENTIN) 875-125  MG tablet Take 1 tablet by mouth every 12 (twelve) hours. (Patient not taking: Reported on 10/15/2020) 14 tablet 0   ARIPiprazole (ABILIFY) 2 MG tablet Take 2 mg by mouth daily.     Azelastine HCl 0.15 % SOLN Place into the nose.     buPROPion (WELLBUTRIN XL) 300 MG 24 hr tablet Take 300 mg by mouth every morning.     calcium-vitamin D (OSCAL WITH D) 500-200 MG-UNIT tablet Take 1 tablet by mouth 3 (three) times daily. 90 tablet 12   cholecalciferol (VITAMIN D) 1000 units tablet Take 2,000 Units by mouth at bedtime.     cyclobenzaprine (FLEXERIL) 10 MG tablet Take 1 tablet (10 mg total) by mouth 3 (three) times daily as needed for muscle spasms. 30 tablet 1   desvenlafaxine (PRISTIQ) 100 MG 24 hr tablet Take 100 mg by mouth every morning.     famotidine-calcium carbonate-magnesium hydroxide (PEPCID COMPLETE) 10-800-165 MG chewable tablet Chew 1 tablet by mouth daily as needed.     fexofenadine (ALLEGRA) 180 MG tablet Take by mouth.     fluticasone (FLONASE) 50 MCG/ACT nasal spray Place into both nostrils daily.     hydrochlorothiazide (HYDRODIURIL) 25 MG tablet TAKE 1 TABLET BY MOUTH EVERY DAY (Patient not taking: No sig reported) 90 tablet 1   ibuprofen (ADVIL,MOTRIN) 200 MG tablet Take 400 mg by mouth every 6 (six) hours as needed for headache, mild pain or moderate pain.     levothyroxine (SYNTHROID) 50 MCG tablet TAKE 1 TABLET BY MOUTH EVERY MORNING MONDAY-SATURDAY AND TAKE 2  TABLETS BY MOUTH ON SUNDAY 105 tablet 3   liothyronine (CYTOMEL) 5 MCG tablet TAKE 1 AND 1/2 TABLET BY MOUTH ONCE DAILY 135 tablet 3   meclizine (ANTIVERT) 25 MG tablet Take 1 tablet (25 mg total) by mouth 3 (three) times daily as needed for dizziness. (Patient not taking: Reported on 10/15/2020) 30 tablet 0   metoprolol tartrate (LOPRESSOR) 25 MG tablet TAKE 1 TABLET BY MOUTH TWICE A DAY 180 tablet 1   ondansetron (ZOFRAN ODT) 4 MG disintegrating tablet Take 1 tablet (4 mg total) by mouth every 8 (eight) hours as needed. (Patient not taking: Reported on 10/15/2020) 20 tablet 0   phenylephrine (SUDAFED PE) 10 MG TABS tablet Take 10 mg by mouth every 4 (four) hours as needed (for congestion).     No current facility-administered medications on file prior to visit.   Allergies  Allergen Reactions   Adhesive [Tape] Rash   Benzoyl Peroxide Rash    Benzine    Milk-Related Compounds Rash   Family History  Problem Relation Age of Onset   Healthy Mother    Alcohol abuse Father        Deceased   Stroke Maternal Grandmother    Arthritis Paternal Grandmother    Depression Daughter    Heart murmur Daughter    Allergic rhinitis Daughter    Fibromyalgia Daughter    Allergic rhinitis Daughter    Migraines Son    Allergic rhinitis Son    Brain cancer Sister    Asthma Sister    Angioedema Neg Hx    Eczema Neg Hx    Immunodeficiency Neg Hx    Urticaria Neg Hx    Colon cancer Neg Hx   + see HPI Also: Sisters with hypertension Child with PDA 2 daughters with history of thyroiditis Sister with brain tumor  PE: BP 140/70 (BP Location: Right Arm, Patient Position: Sitting, Cuff Size: Normal)   Pulse 62  Ht 5\' 2"  (1.575 m)   Wt 248 lb 3.2 oz (112.6 kg)   SpO2 95%   BMI 45.40 kg/m  Body mass index is 45.4 kg/m. Wt Readings from Last 3 Encounters:  10/17/20 248 lb 3.2 oz (112.6 kg)  10/15/20 248 lb 1.6 oz (112.5 kg)  09/17/20 250 lb (113.4 kg)   Constitutional: overweight, in  NAD Eyes: PERRLA, EOMI, no exophthalmos ENT: moist mucous membranes, no thyromegaly, no cervical lymphadenopathy Cardiovascular: RRR, No MRG Respiratory: CTA B Gastrointestinal: abdomen soft, NT, ND, BS+ Musculoskeletal: no deformities, strength intact in all 4 Skin: moist, warm, no rashes Neurological: no tremor with outstretched hands, DTR normal in all 4  ASSESSMENT: 1. Hypothyroidism  2. Vitamin D deficiency  3. Osteopenia  4. MTHFR heterozygosity - C677T normal  5.  Elevated HbA1c  PLAN:  1. Hypothyroidism -She has longstanding hypothyroidism on both levothyroxine and liothyronine therapy - latest thyroid labs reviewed with pt. >> normal: Lab Results  Component Value Date   TSH 0.87 07/09/2020  - she continues on LT4 50 mcg 6/7 days and 100 mcg 1/7 days and also LT3 7.5 mcg daily - pt feels good on this dose but continues to have fatigue, which has not changed. - we discussed about taking the thyroid hormone every day, with water, >30 minutes before breakfast, separated by >4 hours from acid reflux medications, calcium, iron, multivitamins. Pt. is taking it correctly. -We reviewed her recent TFTs from 06/2020 and they were normal.  No need to repeat them today. -I will see her back in a year  2. Vitamin D deficiency -She continues 1000 units vitamin D3 daily -Vitamin D was normal at last check, and again in 06/2020: 41.96 -We will recheck at next visit  3. Osteopenia -History of ankle fracture in 2017, no fractures since last visit -Reviewed latest DXA scan results from 10/29/2019 which showed worsening of the T-scores at the level of the femoral necks but improved bone density at the spine. -After the above results returned, I advised her about weightbearing exercises.  She did not start exercising.  At today's visit, I gave her again a list of weightbearing exercises recommended by the National osteoporosis foundation and recommended to start.  Explained the benefits  on the bone. -She is due for another bone density scan in a year.  At that time, we will decide about the possible need for treatment.  4. MTHFR heterozygosity -In the past, homocystine levels were high and we started methylated vitamin B-she was not taking this consistently -Latest level from a year ago was normal, though, at 7.7  5.  Elevated HbA1c -She had a higher HbA1c 3 months ago, at 5.8%, in the prediabetic range -At today's visit, we checked another HbA1c and this was now normal, at 5.5%  Philemon Kingdom, MD PhD [facility] Endocrinology

## 2020-10-17 NOTE — Patient Instructions (Addendum)
Please continue to take the methylated vitamin B daily.  Please continue Levothyroxine 50 mcg on 6/7 days, and 100 mcg 1/7 days + Cytomel 7.5 mcg.  Take the thyroid hormone every day, with water, at least 30 minutes before breakfast, separated by at least 4 hours from: - acid reflux medications - calcium - iron - multivitamins  Exercise for Strong Bones (from [place]) There are two types of exercises that are important for building and maintaining bone density:  weight-bearing and muscle-strengthening exercises. Weight-bearing Exercises These exercises include activities that make you move against gravity while staying upright. Weight-bearing exercises can be high-impact or low-impact. High-impact weight-bearing exercises help build bones and keep them strong. If you have broken a bone due to osteoporosis or are at risk of breaking a bone, you may need to avoid high-impact exercises. If you're not sure, you should check with your healthcare provider. Examples of high-impact weight-bearing exercises are: Dancing Doing high-impact aerobics Hiking Jogging/running Jumping Rope Stair climbing Tennis Low-impact weight-bearing exercises can also help keep bones strong and are a safe alternative if you cannot do high-impact exercises. Examples of low-impact weight-bearing exercises are: Using elliptical training machines Doing low-impact aerobics Using stair-step machines Fast walking on a treadmill or outside Muscle-Strengthening Exercises These exercises include activities where you move your body, a weight or some other resistance against gravity. They are also known as resistance exercises and include: Lifting weights Using elastic exercise bands Using weight machines Lifting your own body weight Functional movements, such as standing and rising up on your toes Yoga and Pilates can also improve strength, balance and flexibility. However, certain positions may  not be safe for people with osteoporosis or those at increased risk of broken bones. For example, exercises that have you bend forward may increase the chance of breaking a bone in the spine. A physical therapist should be able to help you learn which exercises are safe and appropriate for you. Non-Impact Exercises Non-impact exercises can help you to improve balance, posture and how well you move in everyday activities. These exercises can also help to increase muscle strength and decrease the risk of falls and broken bones. Some of these exercises include: Balance exercises that strengthen your legs and test your balance, such as Tai Chi, can decrease your risk of falls. Posture exercises that improve your posture and reduce rounded or "sloping" shoulders can help you decrease the chance of breaking a bone, especially in the spine. Functional exercises that improve how well you move can help you with everyday activities and decrease your chance of falling and breaking a bone. For example, if you have trouble getting up from a chair or climbing stairs, you should do these activities as exercises. A physical therapist can teach you balance, posture and functional exercises. Starting a New Exercise Program If you haven't exercised regularly for a while, check with your healthcare provider before beginning a new exercise program--particularly if you have health problems such as heart disease, diabetes or high blood pressure. If you're at high risk of breaking a bone, you should work with a physical therapist to develop a safe exercise program. Once you have your healthcare provider's approval, start slowly. If you've already broken bones in the spine because of osteoporosis, be very careful to avoid activities that require reaching down, bending forward, rapid twisting motions, heavy lifting and those that increase your chance of a fall. As you get started, your muscles may feel sore for a day or two after  you exercise. If soreness lasts longer, you may be working too hard and need to ease up. Exercises should be done in a pain-free range of motion. How Much Exercise Do You Need? Weight-bearing exercises 30 minutes on most days of the week. Do a 30-minutesession or multiple sessions spread out throughout the day. The benefits to your bones are the same.   Muscle-strengthening exercises Two to three days per week. If you don't have much time for strengthening/resistance training, do small amounts at a time. You can do just one body part each day. For example do arms one day, legs the next and trunk the next. You can also spread these exercises out during your normal day.  Balance, posture and functional exercises Every day or as often as needed. You may want to focus on one area more than the others. If you have fallen or lose your balance, spend time doing balance exercises. If you are getting rounded shoulders, work more on posture exercises. If you have trouble climbing stairs or getting up from the couch, do more functional exercises. You can also perform these exercises at one time or spread them during your day. Work with a phyiscal therapist to learn the right exercises for you.   Please come back in 1 year.

## 2020-11-11 DIAGNOSIS — F3341 Major depressive disorder, recurrent, in partial remission: Secondary | ICD-10-CM | POA: Diagnosis not present

## 2020-11-11 DIAGNOSIS — F419 Anxiety disorder, unspecified: Secondary | ICD-10-CM | POA: Diagnosis not present

## 2020-12-10 ENCOUNTER — Other Ambulatory Visit: Payer: Self-pay

## 2020-12-11 ENCOUNTER — Encounter: Payer: Self-pay | Admitting: Internal Medicine

## 2020-12-11 ENCOUNTER — Ambulatory Visit (INDEPENDENT_AMBULATORY_CARE_PROVIDER_SITE_OTHER): Payer: BC Managed Care – PPO | Admitting: Internal Medicine

## 2020-12-11 VITALS — BP 130/80 | HR 53 | Temp 98.2°F | Wt 251.4 lb

## 2020-12-11 DIAGNOSIS — I10 Essential (primary) hypertension: Secondary | ICD-10-CM

## 2020-12-11 DIAGNOSIS — Z23 Encounter for immunization: Secondary | ICD-10-CM | POA: Diagnosis not present

## 2020-12-11 MED ORDER — LISINOPRIL 10 MG PO TABS: 10.0000 mg | ORAL_TABLET | Freq: Every day | ORAL | 1 refills | Status: DC

## 2020-12-11 NOTE — Progress Notes (Signed)
Established Patient Office Visit     This visit occurred during the SARS-CoV-2 public health emergency.  Safety protocols were in place, including screening questions prior to the visit, additional usage of staff PPE, and extensive cleaning of exam room while observing appropriate contact time as indicated for disinfecting solutions.    CC/Reason for Visit: Follow-up blood pressure  HPI: Mary Reyes is a 60 y.o. female who is coming in today for the above mentioned reasons.  She is here today to follow-up on her blood pressure.  She had been taken off hydrochlorothiazide due to hyponatremia.  She is currently on metoprolol 25 mg daily.  She brings in her ambulatory blood pressures which consist of systolics in the 0000000 to [id number] and diastolics in the upper [id number].  2 in office blood pressure measurements have been 130/80 and 140/75.  She feels well and has no acute concerns.  Past Medical/Surgical History: Past Medical History:  Diagnosis Date   Allergy    Anxiety    Arthritis    Blood in stool    Colon polyp    Depression    Eczema    Frequent headaches    GERD (gastroesophageal reflux disease)    Hypertension    Hypothyroidism    Migraine    OSA (obstructive sleep apnea)    CPAP nightly   Osteopenia    Sleep apnea    Thyroid disease    Trimalleolar fracture of ankle, closed, left, sequela     Past Surgical History:  Procedure Laterality Date   ABDOMINAL HYSTERECTOMY  2004   ORIF ANKLE FRACTURE Left 02/12/2016   Procedure: OPEN REDUCTION INTERNAL FIXATION (ORIF) LEFT ANKLE FRACTURE;  Surgeon: Leandrew Koyanagi, MD;  Location: [place] [facility];  Service: Orthopedics;  Laterality: Left;   WISDOM TOOTH EXTRACTION      Social History:  reports that she has never smoked. She has never used smokeless tobacco. She reports that she does not drink alcohol and does not use drugs.  Allergies: Allergies  Allergen Reactions   Adhesive [Tape] Rash   Benzoyl Peroxide  Rash    Benzine    Milk-Related Compounds Rash    Family History:  Family History  Problem Relation Age of Onset   Healthy Mother    Alcohol abuse Father        Deceased   Stroke Maternal Grandmother    Arthritis Paternal Grandmother    Depression Daughter    Heart murmur Daughter    Allergic rhinitis Daughter    Fibromyalgia Daughter    Allergic rhinitis Daughter    Migraines Son    Allergic rhinitis Son    Brain cancer Sister    Asthma Sister    Angioedema Neg Hx    Eczema Neg Hx    Immunodeficiency Neg Hx    Urticaria Neg Hx    Colon cancer Neg Hx      Current Outpatient Medications:    ALPRAZolam (XANAX) 0.25 MG tablet, Take 0.25 mg by mouth at bedtime as needed for anxiety or sleep. , Disp: , Rfl: 0   ARIPiprazole (ABILIFY) 2 MG tablet, Take 2 mg by mouth daily., Disp: , Rfl:    Azelastine HCl 0.15 % SOLN, Place into the nose., Disp: , Rfl:    buPROPion (WELLBUTRIN XL) 300 MG 24 hr tablet, Take 300 mg by mouth every morning., Disp: , Rfl:    calcium-vitamin D (OSCAL WITH D) 500-200 MG-UNIT tablet, Take 1 tablet by mouth  3 (three) times daily., Disp: 90 tablet, Rfl: 12   cholecalciferol (VITAMIN D) 1000 units tablet, Take 2,000 Units by mouth at bedtime., Disp: , Rfl:    cyclobenzaprine (FLEXERIL) 10 MG tablet, Take 1 tablet (10 mg total) by mouth 3 (three) times daily as needed for muscle spasms., Disp: 30 tablet, Rfl: 1   desvenlafaxine (PRISTIQ) 100 MG 24 hr tablet, Take 100 mg by mouth every morning., Disp: , Rfl:    famotidine-calcium carbonate-magnesium hydroxide (PEPCID COMPLETE) 10-800-165 MG chewable tablet, Chew 1 tablet by mouth daily as needed., Disp: , Rfl:    fexofenadine (ALLEGRA) 180 MG tablet, Take by mouth., Disp: , Rfl:    fluticasone (FLONASE) 50 MCG/ACT nasal spray, Place into both nostrils daily., Disp: , Rfl:    ibuprofen (ADVIL,MOTRIN) 200 MG tablet, Take 400 mg by mouth every 6 (six) hours as needed for headache, mild pain or moderate pain.,  Disp: , Rfl:    levothyroxine (SYNTHROID) 50 MCG tablet, TAKE 1 TABLET BY MOUTH EVERY MORNING MONDAY-SATURDAY AND TAKE 2 TABLETS BY MOUTH ON SUNDAY, Disp: 105 tablet, Rfl: 3   liothyronine (CYTOMEL) 5 MCG tablet, TAKE 1 AND 1/2 TABLET BY MOUTH ONCE DAILY, Disp: 135 tablet, Rfl: 3   lisinopril (ZESTRIL) 10 MG tablet, Take 1 tablet (10 mg total) by mouth daily., Disp: 90 tablet, Rfl: 1   metoprolol tartrate (LOPRESSOR) 25 MG tablet, TAKE 1 TABLET BY MOUTH TWICE A DAY, Disp: 180 tablet, Rfl: 1   ondansetron (ZOFRAN ODT) 4 MG disintegrating tablet, Take 1 tablet (4 mg total) by mouth every 8 (eight) hours as needed., Disp: 20 tablet, Rfl: 0  Review of Systems:  Constitutional: Denies fever, chills, diaphoresis, appetite change and fatigue.  HEENT: Denies photophobia, eye pain, redness, hearing loss, ear pain, congestion, sore throat, rhinorrhea, sneezing, mouth sores, trouble swallowing, neck pain, neck stiffness and tinnitus.   Respiratory: Denies SOB, DOE, cough, chest tightness,  and wheezing.   Cardiovascular: Denies chest pain, palpitations and leg swelling.  Gastrointestinal: Denies nausea, vomiting, abdominal pain, diarrhea, constipation, blood in stool and abdominal distention.  Genitourinary: Denies dysuria, urgency, frequency, hematuria, flank pain and difficulty urinating.  Endocrine: Denies: hot or cold intolerance, sweats, changes in hair or nails, polyuria, polydipsia. Musculoskeletal: Denies myalgias, back pain, joint swelling, arthralgias and gait problem.  Skin: Denies pallor, rash and wound.  Neurological: Denies dizziness, seizures, syncope, weakness, light-headedness, numbness and headaches.  Hematological: Denies adenopathy. Easy bruising, personal or family bleeding history  Psychiatric/Behavioral: Denies suicidal ideation, mood changes, confusion, nervousness, sleep disturbance and agitation    Physical Exam: Vitals:   12/11/20 1028  BP: 130/80  Pulse: (!) 53  Temp:  98.2 F (36.8 C)  TempSrc: Oral  SpO2: 97%  Weight: 251 lb 6.4 oz (114 kg)    Body mass index is 45.98 kg/m.   Constitutional: NAD, calm, comfortable Eyes: PERRL, lids and conjunctivae normal, wears corrective lenses ENMT: Mucous membranes are moist.  Respiratory: clear to auscultation bilaterally, no wheezing, no crackles. Normal respiratory effort. No accessory muscle use.  Cardiovascular: Regular rate and rhythm, no murmurs / rubs / gallops. No extremity edema.  Neurologic: Grossly intact and nonfocal Psychiatric: Normal judgment and insight. Alert and oriented x 3. Normal mood.    Impression and Plan:  Essential hypertension  - Plan: lisinopril (ZESTRIL) 10 MG tablet daily in addition to continuing metoprolol 25 mg twice daily. -She will continue ambulatory blood pressure monitoring and return in 6 to 8 weeks for follow-up.  Needs flu  shot  - Plan: Flu Vaccine QUAD 6+ mos PF IM (Fluarix Quad PF)    Patient Instructions  -Nice seeing you today!!  -Start lisinopril 10 mg daily in addition to metoprolol 25 mg twice daily for your bloos pressure.  -Schedule follow up in 6-8 weeks.    Lelon Frohlich, MD [facility] at [facility]

## 2020-12-11 NOTE — Patient Instructions (Addendum)
-  Nice seeing you today!!  -Start lisinopril 10 mg daily in addition to metoprolol 25 mg twice daily for your blood pressure.  -Schedule follow up in 6-8 weeks.

## 2020-12-27 DIAGNOSIS — G4733 Obstructive sleep apnea (adult) (pediatric): Secondary | ICD-10-CM | POA: Diagnosis not present

## 2021-01-06 ENCOUNTER — Other Ambulatory Visit: Payer: Self-pay

## 2021-01-07 ENCOUNTER — Encounter: Payer: Self-pay | Admitting: Internal Medicine

## 2021-01-07 ENCOUNTER — Ambulatory Visit (INDEPENDENT_AMBULATORY_CARE_PROVIDER_SITE_OTHER): Payer: BC Managed Care – PPO | Admitting: Internal Medicine

## 2021-01-07 VITALS — BP 136/72 | HR 61 | Temp 98.3°F | Ht 62.0 in | Wt 251.8 lb

## 2021-01-07 DIAGNOSIS — E039 Hypothyroidism, unspecified: Secondary | ICD-10-CM

## 2021-01-07 DIAGNOSIS — G4733 Obstructive sleep apnea (adult) (pediatric): Secondary | ICD-10-CM

## 2021-01-07 DIAGNOSIS — I10 Essential (primary) hypertension: Secondary | ICD-10-CM | POA: Diagnosis not present

## 2021-01-07 DIAGNOSIS — E559 Vitamin D deficiency, unspecified: Secondary | ICD-10-CM

## 2021-01-07 DIAGNOSIS — K219 Gastro-esophageal reflux disease without esophagitis: Secondary | ICD-10-CM

## 2021-01-07 DIAGNOSIS — F331 Major depressive disorder, recurrent, moderate: Secondary | ICD-10-CM

## 2021-01-07 NOTE — Progress Notes (Signed)
Established Patient Office Visit     This visit occurred during the SARS-CoV-2 public health emergency.  Safety protocols were in place, including screening questions prior to the visit, additional usage of staff PPE, and extensive cleaning of exam room while observing appropriate contact time as indicated for disinfecting solutions.    CC/Reason for Visit: 57-month follow-up chronic medical conditions  HPI: Mary Reyes is a 60 y.o. female who is coming in today for the above mentioned reasons. Past Medical History is significant for: Hypertension, obstructive sleep apnea, GERD, hypothyroidism, depression, vitamin D deficiency, morbid obesity.  Lately we have been seeing her more frequently due to her hypertension.  She had to be taken off hydrochlorothiazide due to hyponatremia.  She brings in her ambulatory readings today which on average are in the 150/70 range.  She also brings in her ambulatory blood pressure cuff.  In office today blood pressure with a cuff was 154/78.  2 in office measurements are 135/75 and 136/72 manually.  She feels well and has no acute concerns.  All cancer screening is up-to-date, she had her flu vaccination in September.   Past Medical/Surgical History: Past Medical History:  Diagnosis Date   Allergy    Anxiety    Arthritis    Blood in stool    Colon polyp    Depression    Eczema    Frequent headaches    GERD (gastroesophageal reflux disease)    Hypertension    Hypothyroidism    Migraine    OSA (obstructive sleep apnea)    CPAP nightly   Osteopenia    Sleep apnea    Thyroid disease    Trimalleolar fracture of ankle, closed, left, sequela     Past Surgical History:  Procedure Laterality Date   ABDOMINAL HYSTERECTOMY  2004   ORIF ANKLE FRACTURE Left 02/12/2016   Procedure: OPEN REDUCTION INTERNAL FIXATION (ORIF) LEFT ANKLE FRACTURE;  Surgeon: Leandrew Koyanagi, MD;  Location: [place];  Service: Orthopedics;  Laterality:  Left;   WISDOM TOOTH EXTRACTION      Social History:  reports that she has never smoked. She has never used smokeless tobacco. She reports that she does not drink alcohol and does not use drugs.  Allergies: Allergies  Allergen Reactions   Adhesive [Tape] Rash   Benzoyl Peroxide Rash    Benzine    Milk-Related Compounds Rash    Family History:  Family History  Problem Relation Age of Onset   Healthy Mother    Alcohol abuse Father        Deceased   Stroke Maternal Grandmother    Arthritis Paternal Grandmother    Depression Daughter    Heart murmur Daughter    Allergic rhinitis Daughter    Fibromyalgia Daughter    Allergic rhinitis Daughter    Migraines Son    Allergic rhinitis Son    Brain cancer Sister    Asthma Sister    Angioedema Neg Hx    Eczema Neg Hx    Immunodeficiency Neg Hx    Urticaria Neg Hx    Colon cancer Neg Hx      Current Outpatient Medications:    ALPRAZolam (XANAX) 0.25 MG tablet, Take 0.25 mg by mouth at bedtime as needed for anxiety or sleep. , Disp: , Rfl: 0   ARIPiprazole (ABILIFY) 2 MG tablet, Take 2 mg by mouth daily., Disp: , Rfl:    Azelastine HCl 0.15 % SOLN, Place into the nose.,  Disp: , Rfl:    buPROPion (WELLBUTRIN XL) 300 MG 24 hr tablet, Take 300 mg by mouth every morning., Disp: , Rfl:    calcium-vitamin D (OSCAL WITH D) 500-200 MG-UNIT tablet, Take 1 tablet by mouth 3 (three) times daily., Disp: 90 tablet, Rfl: 12   cholecalciferol (VITAMIN D) 1000 units tablet, Take 2,000 Units by mouth at bedtime., Disp: , Rfl:    cyclobenzaprine (FLEXERIL) 10 MG tablet, Take 1 tablet (10 mg total) by mouth 3 (three) times daily as needed for muscle spasms., Disp: 30 tablet, Rfl: 1   desvenlafaxine (PRISTIQ) 100 MG 24 hr tablet, Take 100 mg by mouth every morning., Disp: , Rfl:    famotidine-calcium carbonate-magnesium hydroxide (PEPCID COMPLETE) 10-800-165 MG chewable tablet, Chew 1 tablet by mouth daily as needed., Disp: , Rfl:    fexofenadine  (ALLEGRA) 180 MG tablet, Take by mouth., Disp: , Rfl:    fluticasone (FLONASE) 50 MCG/ACT nasal spray, Place into both nostrils daily., Disp: , Rfl:    ibuprofen (ADVIL,MOTRIN) 200 MG tablet, Take 400 mg by mouth every 6 (six) hours as needed for headache, mild pain or moderate pain., Disp: , Rfl:    levothyroxine (SYNTHROID) 50 MCG tablet, TAKE 1 TABLET BY MOUTH EVERY MORNING MONDAY-SATURDAY AND TAKE 2 TABLETS BY MOUTH ON SUNDAY, Disp: 105 tablet, Rfl: 3   liothyronine (CYTOMEL) 5 MCG tablet, TAKE 1 AND 1/2 TABLET BY MOUTH ONCE DAILY, Disp: 135 tablet, Rfl: 3   lisinopril (ZESTRIL) 10 MG tablet, Take 1 tablet (10 mg total) by mouth daily., Disp: 90 tablet, Rfl: 1   metoprolol tartrate (LOPRESSOR) 25 MG tablet, TAKE 1 TABLET BY MOUTH TWICE A DAY, Disp: 180 tablet, Rfl: 1   ondansetron (ZOFRAN ODT) 4 MG disintegrating tablet, Take 1 tablet (4 mg total) by mouth every 8 (eight) hours as needed., Disp: 20 tablet, Rfl: 0  Review of Systems:  Constitutional: Denies fever, chills, diaphoresis, appetite change and fatigue.  HEENT: Denies photophobia, eye pain, redness, hearing loss, ear pain, congestion, sore throat, rhinorrhea, sneezing, mouth sores, trouble swallowing, neck pain, neck stiffness and tinnitus.   Respiratory: Denies SOB, DOE, cough, chest tightness,  and wheezing.   Cardiovascular: Denies chest pain, palpitations and leg swelling.  Gastrointestinal: Denies nausea, vomiting, abdominal pain, diarrhea, constipation, blood in stool and abdominal distention.  Genitourinary: Denies dysuria, urgency, frequency, hematuria, flank pain and difficulty urinating.  Endocrine: Denies: hot or cold intolerance, sweats, changes in hair or nails, polyuria, polydipsia. Musculoskeletal: Denies myalgias, back pain, joint swelling, arthralgias and gait problem.  Skin: Denies pallor, rash and wound.  Neurological: Denies dizziness, seizures, syncope, weakness, light-headedness, numbness and headaches.   Hematological: Denies adenopathy. Easy bruising, personal or family bleeding history  Psychiatric/Behavioral: Denies suicidal ideation, mood changes, confusion, nervousness, sleep disturbance and agitation    Physical Exam: Vitals:   01/07/21 0946  BP: 136/72  Pulse: 61  Temp: 98.3 F (36.8 C)  TempSrc: Oral  SpO2: 95%  Weight: 251 lb 12.8 oz (114.2 kg)  Height: 5\' 2"  (1.575 m)    Body mass index is 46.05 kg/m.   Constitutional: NAD, calm, comfortable Eyes: PERRL, lids and conjunctivae normal, wears corrective lenses ENMT: Mucous membranes are moist.  Respiratory: clear to auscultation bilaterally, no wheezing, no crackles. Normal respiratory effort. No accessory muscle use.  Cardiovascular: Regular rate and rhythm, no murmurs / rubs / gallops. No extremity edema.  Neurologic: Grossly intact and nonfocal Psychiatric: Normal judgment and insight. Alert and oriented x 3. Normal mood.  Impression and Plan:  Essential hypertension -Still in office answer 135/75, 136/72, with her machine 154/78 in office. -I feel like her machine runs 15 to 20 mm higher in regards to systolic blood pressure. -I feel it is reasonable to observe and have her follow-up with me in 3 months.  No medication changes today.  She is on lisinopril 10 mg daily, metoprolol 25 mg twice daily.  Vitamin D deficiency -Recheck levels when she returns for CPE.  Acquired hypothyroidism -Last TSH was normal at 0.870 in April 2022  OSA (obstructive sleep apnea) -On nightly CPAP.  Moderate episode of recurrent major depressive disorder (HCC) -Mood is currently stable on Wellbutrin, Pristiq, Abilify, she is followed by psychiatry.  Gastroesophageal reflux disease, unspecified whether esophagitis present -Well-controlled on H2 blocker.  Time spent: 32 minutes reviewing chart, interviewing and examining patient and formulating plan of care.    Lelon Frohlich, MD [facility] at  [facility]

## 2021-01-09 ENCOUNTER — Ambulatory Visit: Payer: BC Managed Care – PPO | Admitting: Internal Medicine

## 2021-01-21 DIAGNOSIS — Z961 Presence of intraocular lens: Secondary | ICD-10-CM | POA: Diagnosis not present

## 2021-02-22 ENCOUNTER — Other Ambulatory Visit: Payer: Self-pay | Admitting: Internal Medicine

## 2021-02-22 DIAGNOSIS — I10 Essential (primary) hypertension: Secondary | ICD-10-CM

## 2021-03-28 DIAGNOSIS — G4733 Obstructive sleep apnea (adult) (pediatric): Secondary | ICD-10-CM | POA: Diagnosis not present

## 2021-05-14 DIAGNOSIS — F419 Anxiety disorder, unspecified: Secondary | ICD-10-CM | POA: Diagnosis not present

## 2021-05-14 DIAGNOSIS — F3341 Major depressive disorder, recurrent, in partial remission: Secondary | ICD-10-CM | POA: Diagnosis not present

## 2021-05-27 ENCOUNTER — Other Ambulatory Visit: Payer: Self-pay | Admitting: Internal Medicine

## 2021-05-27 DIAGNOSIS — I10 Essential (primary) hypertension: Secondary | ICD-10-CM

## 2021-06-26 DIAGNOSIS — G4733 Obstructive sleep apnea (adult) (pediatric): Secondary | ICD-10-CM | POA: Diagnosis not present

## 2021-07-10 ENCOUNTER — Encounter: Payer: BC Managed Care – PPO | Admitting: Internal Medicine

## 2021-07-15 ENCOUNTER — Ambulatory Visit (INDEPENDENT_AMBULATORY_CARE_PROVIDER_SITE_OTHER): Payer: BC Managed Care – PPO | Admitting: Internal Medicine

## 2021-07-15 ENCOUNTER — Encounter: Payer: Self-pay | Admitting: Internal Medicine

## 2021-07-15 VITALS — BP 130/80 | HR 58 | Temp 98.1°F | Ht 63.0 in | Wt 255.3 lb

## 2021-07-15 DIAGNOSIS — E039 Hypothyroidism, unspecified: Secondary | ICD-10-CM | POA: Diagnosis not present

## 2021-07-15 DIAGNOSIS — Z0000 Encounter for general adult medical examination without abnormal findings: Secondary | ICD-10-CM | POA: Diagnosis not present

## 2021-07-15 DIAGNOSIS — E559 Vitamin D deficiency, unspecified: Secondary | ICD-10-CM

## 2021-07-15 DIAGNOSIS — R7302 Impaired glucose tolerance (oral): Secondary | ICD-10-CM | POA: Insufficient documentation

## 2021-07-15 DIAGNOSIS — F331 Major depressive disorder, recurrent, moderate: Secondary | ICD-10-CM

## 2021-07-15 DIAGNOSIS — I10 Essential (primary) hypertension: Secondary | ICD-10-CM

## 2021-07-15 DIAGNOSIS — Z1211 Encounter for screening for malignant neoplasm of colon: Secondary | ICD-10-CM | POA: Diagnosis not present

## 2021-07-15 LAB — COMPREHENSIVE METABOLIC PANEL
ALT: 34 U/L (ref 0–35)
AST: 24 U/L (ref 0–37)
Albumin: 4.2 g/dL (ref 3.5–5.2)
Alkaline Phosphatase: 70 U/L (ref 39–117)
BUN: 12 mg/dL (ref 6–23)
CO2: 31 mEq/L (ref 19–32)
Calcium: 9.3 mg/dL (ref 8.4–10.5)
Chloride: 100 mEq/L (ref 96–112)
Creatinine, Ser: 1 mg/dL (ref 0.40–1.20)
GFR: 61.11 mL/min (ref >60.00)
Glucose, Bld: 73 mg/dL (ref 70–99)
Potassium: 4.7 mEq/L (ref 3.5–5.1)
Sodium: 136 mEq/L (ref 135–145)
Total Bilirubin: 0.3 mg/dL (ref 0.2–1.2)
Total Protein: 7.4 g/dL (ref 6.0–8.3)

## 2021-07-15 LAB — TSH: TSH: 1.5 u[IU]/mL (ref 0.35–5.50)

## 2021-07-15 LAB — CBC WITH DIFFERENTIAL/PLATELET
Basophils Absolute: 0 10*3/uL (ref 0.0–0.1)
Basophils Relative: 0.6 % (ref 0.0–3.0)
Eosinophils Absolute: 0.1 10*3/uL (ref 0.0–0.7)
Eosinophils Relative: 1.7 % (ref 0.0–5.0)
HCT: 41 % (ref 36.0–46.0)
Hemoglobin: 13.8 g/dL (ref 12.0–15.0)
Lymphocytes Relative: 30.3 % (ref 12.0–46.0)
Lymphs Abs: 1.5 10*3/uL (ref 0.7–4.0)
MCHC: 33.6 g/dL (ref 30.0–36.0)
MCV: 86.9 fl (ref 78.0–100.0)
Monocytes Absolute: 0.5 10*3/uL (ref 0.1–1.0)
Monocytes Relative: 11.4 % (ref 3.0–12.0)
Neutro Abs: 2.7 10*3/uL (ref 1.4–7.7)
Neutrophils Relative %: 56 % (ref 43.0–77.0)
Platelets: 257 10*3/uL (ref 150.0–400.0)
RBC: 4.72 Mil/uL (ref 3.87–5.11)
RDW: 13.7 % (ref 11.5–15.5)
WBC: 4.8 10*3/uL (ref 4.0–10.5)

## 2021-07-15 LAB — LIPID PANEL
Cholesterol: 187 mg/dL (ref 0–200)
HDL: 58.8 mg/dL (ref >39.00)
LDL Cholesterol: 106 mg/dL — ABNORMAL HIGH (ref 0–99)
NonHDL: 128.66
Total CHOL/HDL Ratio: 3
Triglycerides: 115 mg/dL (ref 0.0–149.0)
VLDL: 23 mg/dL (ref 0.0–40.0)

## 2021-07-15 LAB — HEMOGLOBIN A1C: Hgb A1c MFr Bld: 6 % (ref 4.6–6.5)

## 2021-07-15 LAB — VITAMIN D 25 HYDROXY (VIT D DEFICIENCY, FRACTURES): VITD: 42.01 ng/mL (ref 30.00–100.00)

## 2021-07-15 LAB — VITAMIN B12: Vitamin B-12: 332 pg/mL (ref 211–911)

## 2021-07-15 NOTE — Patient Instructions (Signed)
-  Nice seeing you today!! ? ?-Lab work today; will notify you once results are available. ? ?-Remember your COVID booster at the pharmacy. ? ?-Schedule follow up in 6 months. ?

## 2021-07-15 NOTE — Progress Notes (Signed)
? ? ? ?Established Patient Office Visit ? ? ? ? ?This visit occurred during the SARS-CoV-2 public health emergency.  Safety protocols were in place, including screening questions prior to the visit, additional usage of staff PPE, and extensive cleaning of exam room while observing appropriate contact time as indicated for disinfecting solutions.  ? ? ?CC/Reason for Visit: Annual preventive exam ? ?HPI: Mary Reyes is a 61 y.o. female who is coming in today for the above mentioned reasons. Past Medical History is significant for: Hypertension, obstructive sleep apnea, GERD, hypothyroidism, depression, vitamin D deficiency, morbid obesity and hypertension.  She has routine eye and dental care.  She had a colonoscopy in 2018, mammogram in 2022, she was told by her gynecologist that she no longer needed Pap smears due to having had a complete hysterectomy.  She is overdue for COVID vaccination.  She would like me to note that her sister was diagnosed with Lewy body dementia at age 57 and now at age 42 has been placed in hospice. ? ? ?Past Medical/Surgical History: ?Past Medical History:  ?Diagnosis Date  ? Allergy   ? Anxiety   ? Arthritis   ? Blood in stool   ? Colon polyp   ? Depression   ? Eczema   ? Frequent headaches   ? GERD (gastroesophageal reflux disease)   ? Hypertension   ? Hypothyroidism   ? Migraine   ? OSA (obstructive sleep apnea)   ? CPAP nightly  ? Osteopenia   ? Sleep apnea   ? Thyroid disease   ? Trimalleolar fracture of ankle, closed, left, sequela   ? ? ?Past Surgical History:  ?Procedure Laterality Date  ? ABDOMINAL HYSTERECTOMY  2004  ? ORIF ANKLE FRACTURE Left 02/12/2016  ? Procedure: OPEN REDUCTION INTERNAL FIXATION (ORIF) LEFT ANKLE FRACTURE;  Surgeon: Leandrew Koyanagi, MD;  Location: [place];  Service: Orthopedics;  Laterality: Left;  ? WISDOM TOOTH EXTRACTION    ? ? ?Social History: ? reports that she has never smoked. She has never used smokeless tobacco. She reports  that she does not drink alcohol and does not use drugs. ? ?Allergies: ?Allergies  ?Allergen Reactions  ? Adhesive [Tape] Rash  ? Benzoyl Peroxide Rash  ?  Benzine   ? Milk-Related Compounds Rash  ? ? ?Family History:  ?Family History  ?Problem Relation Age of Onset  ? Healthy Mother   ? Alcohol abuse Father   ?     Deceased  ? Stroke Maternal Grandmother   ? Arthritis Paternal Grandmother   ? Depression Daughter   ? Heart murmur Daughter   ? Allergic rhinitis Daughter   ? Fibromyalgia Daughter   ? Allergic rhinitis Daughter   ? Migraines Son   ? Allergic rhinitis Son   ? Brain cancer Sister   ? Asthma Sister   ? Angioedema Neg Hx   ? Eczema Neg Hx   ? Immunodeficiency Neg Hx   ? Urticaria Neg Hx   ? Colon cancer Neg Hx   ? ? ? ?Current Outpatient Medications:  ?  ALPRAZolam (XANAX) 0.25 MG tablet, Take 0.25 mg by mouth at bedtime as needed for anxiety or sleep. , Disp: , Rfl: 0 ?  ARIPiprazole (ABILIFY) 2 MG tablet, Take 2 mg by mouth daily., Disp: , Rfl:  ?  Azelastine HCl 0.15 % SOLN, Place into the nose., Disp: , Rfl:  ?  buPROPion (WELLBUTRIN XL) 300 MG 24 hr tablet, Take 300 mg by mouth  every morning., Disp: , Rfl:  ?  calcium-vitamin D (OSCAL WITH D) 500-200 MG-UNIT tablet, Take 1 tablet by mouth 3 (three) times daily., Disp: 90 tablet, Rfl: 12 ?  cholecalciferol (VITAMIN D) 1000 units tablet, Take 2,000 Units by mouth at bedtime., Disp: , Rfl:  ?  cyclobenzaprine (FLEXERIL) 10 MG tablet, Take 1 tablet (10 mg total) by mouth 3 (three) times daily as needed for muscle spasms., Disp: 30 tablet, Rfl: 1 ?  desvenlafaxine (PRISTIQ) 100 MG 24 hr tablet, Take 100 mg by mouth every morning., Disp: , Rfl:  ?  famotidine-calcium carbonate-magnesium hydroxide (PEPCID COMPLETE) 10-800-165 MG chewable tablet, Chew 1 tablet by mouth daily as needed., Disp: , Rfl:  ?  fexofenadine (ALLEGRA) 180 MG tablet, Take by mouth., Disp: , Rfl:  ?  fluticasone (FLONASE) 50 MCG/ACT nasal spray, Place into both nostrils daily., Disp: ,  Rfl:  ?  ibuprofen (ADVIL,MOTRIN) 200 MG tablet, Take 400 mg by mouth every 6 (six) hours as needed for headache, mild pain or moderate pain., Disp: , Rfl:  ?  levothyroxine (SYNTHROID) 50 MCG tablet, TAKE 1 TABLET BY MOUTH EVERY MORNING MONDAY-SATURDAY AND TAKE 2 TABLETS BY MOUTH ON SUNDAY, Disp: 105 tablet, Rfl: 3 ?  liothyronine (CYTOMEL) 5 MCG tablet, TAKE 1 AND 1/2 TABLET BY MOUTH ONCE DAILY, Disp: 135 tablet, Rfl: 3 ?  lisinopril (ZESTRIL) 10 MG tablet, TAKE 1 TABLET BY MOUTH EVERY DAY, Disp: 90 tablet, Rfl: 0 ?  metoprolol tartrate (LOPRESSOR) 25 MG tablet, TAKE 1 TABLET BY MOUTH TWICE A DAY, Disp: 180 tablet, Rfl: 1 ?  ondansetron (ZOFRAN ODT) 4 MG disintegrating tablet, Take 1 tablet (4 mg total) by mouth every 8 (eight) hours as needed., Disp: 20 tablet, Rfl: 0 ? ?Review of Systems:  ?Constitutional: Denies fever, chills, diaphoresis, appetite change and fatigue.  ?HEENT: Denies photophobia, eye pain, redness, hearing loss, ear pain, congestion, sore throat, rhinorrhea, sneezing, mouth sores, trouble swallowing, neck pain, neck stiffness and tinnitus.   ?Respiratory: Denies SOB, DOE, cough, chest tightness,  and wheezing.   ?Cardiovascular: Denies chest pain, palpitations and leg swelling.  ?Gastrointestinal: Denies nausea, vomiting, abdominal pain, diarrhea, constipation, blood in stool and abdominal distention.  ?Genitourinary: Denies dysuria, urgency, frequency, hematuria, flank pain and difficulty urinating.  ?Endocrine: Denies: hot or cold intolerance, sweats, changes in hair or nails, polyuria, polydipsia. ?Musculoskeletal: Denies myalgias, back pain, joint swelling, arthralgias and gait problem.  ?Skin: Denies pallor, rash and wound.  ?Neurological: Denies dizziness, seizures, syncope, weakness, light-headedness, numbness and headaches.  ?Hematological: Denies adenopathy. Easy bruising, personal or family bleeding history  ?Psychiatric/Behavioral: Denies suicidal ideation, mood changes, confusion,  nervousness, sleep disturbance and agitation ? ? ? ?Physical Exam: ?Vitals:  ? 07/15/21 1027  ?BP: 130/80  ?Pulse: (!) 58  ?Temp: 98.1 ?F (36.7 ?C)  ?TempSrc: Oral  ?SpO2: 95%  ?Weight: 255 lb 4.8 oz (115.8 kg)  ?Height: '5\' 3"'$  (1.6 m)  ? ? ?Body mass index is 45.22 kg/m?. ? ? ?Constitutional: NAD, calm, comfortable, obese ?Eyes: PERRL, lids and conjunctivae normal, wears corrective lenses ?ENMT: Mucous membranes are moist. Posterior pharynx clear of any exudate or lesions. Normal dentition. Tympanic membrane is pearly white, no erythema or bulging. ?Neck: normal, supple, no masses, no thyromegaly ?Respiratory: clear to auscultation bilaterally, no wheezing, no crackles. Normal respiratory effort. No accessory muscle use.  ?Cardiovascular: Regular rate and rhythm, no murmurs / rubs / gallops. No extremity edema. 2+ pedal pulses. No carotid bruits.  ?Abdomen: no tenderness, no masses palpated.  No hepatosplenomegaly. Bowel sounds positive.  ?Musculoskeletal: no clubbing / cyanosis. No joint deformity upper and lower extremities. Good ROM, no contractures. Normal muscle tone.  ?Skin: no rashes, lesions, ulcers. No induration ?Neurologic: CN 2-12 grossly intact. Sensation intact, DTR normal. Strength 5/5 in all 4.  ?Psychiatric: Normal judgment and insight. Alert and oriented x 3. Normal mood.  ? ? ?Impression and Plan: ? ?Screening for malignant neoplasm of colon  ?- Plan: Ambulatory referral to Gastroenterology ? ?Encounter for preventive health examination ?-Recommend routine eye and dental care. ?-Immunizations: COVID booster at pharmacy, other immunizations are up-to-date ?-Healthy lifestyle discussed in detail. ?-Labs to be updated today. ?-Colon cancer screening: 09/2016, 5-year callback, GI referral placed ?-Breast cancer screening: 08/2020 ?-Cervical cancer screening: No further due to hysterectomy per GYN ?-Lung cancer screening: Not applicable ?-Prostate cancer screening: Not applicable ?-DEXA: Not  applicable ? ?Morbid obesity ([place])  ?- Plan: Hemoglobin A1c, Lipid panel, Vitamin B12 ?-Discussed healthy lifestyle, including increased physical activity and better food choices to promote weight loss. ? ?Essential hy

## 2021-07-16 ENCOUNTER — Other Ambulatory Visit: Payer: Self-pay | Admitting: *Deleted

## 2021-07-16 DIAGNOSIS — R7302 Impaired glucose tolerance (oral): Secondary | ICD-10-CM

## 2021-08-06 ENCOUNTER — Other Ambulatory Visit: Payer: Self-pay | Admitting: Internal Medicine

## 2021-08-06 DIAGNOSIS — Z1231 Encounter for screening mammogram for malignant neoplasm of breast: Secondary | ICD-10-CM

## 2021-08-15 ENCOUNTER — Other Ambulatory Visit: Payer: Self-pay | Admitting: Internal Medicine

## 2021-08-15 DIAGNOSIS — I10 Essential (primary) hypertension: Secondary | ICD-10-CM

## 2021-09-11 ENCOUNTER — Ambulatory Visit
Admission: RE | Admit: 2021-09-11 | Discharge: 2021-09-11 | Disposition: A | Discharge status: Home / Self Care | Payer: BC Managed Care – PPO | Source: Ambulatory Visit | Attending: Internal Medicine | Admitting: Internal Medicine

## 2021-09-11 DIAGNOSIS — Z1231 Encounter for screening mammogram for malignant neoplasm of breast: Secondary | ICD-10-CM | POA: Diagnosis not present

## 2021-09-26 DIAGNOSIS — G4733 Obstructive sleep apnea (adult) (pediatric): Secondary | ICD-10-CM | POA: Diagnosis not present

## 2021-10-04 IMAGING — MG DIGITAL SCREENING BILAT W/ CAD
5 series · 5 of 5 positions shown · non-contrast
Comparison: Previous exam(s).

CLINICAL DATA: Screening.

EXAM:
DIGITAL SCREENING BILATERAL MAMMOGRAM WITH CAD

[R MLO (1 of 2)]
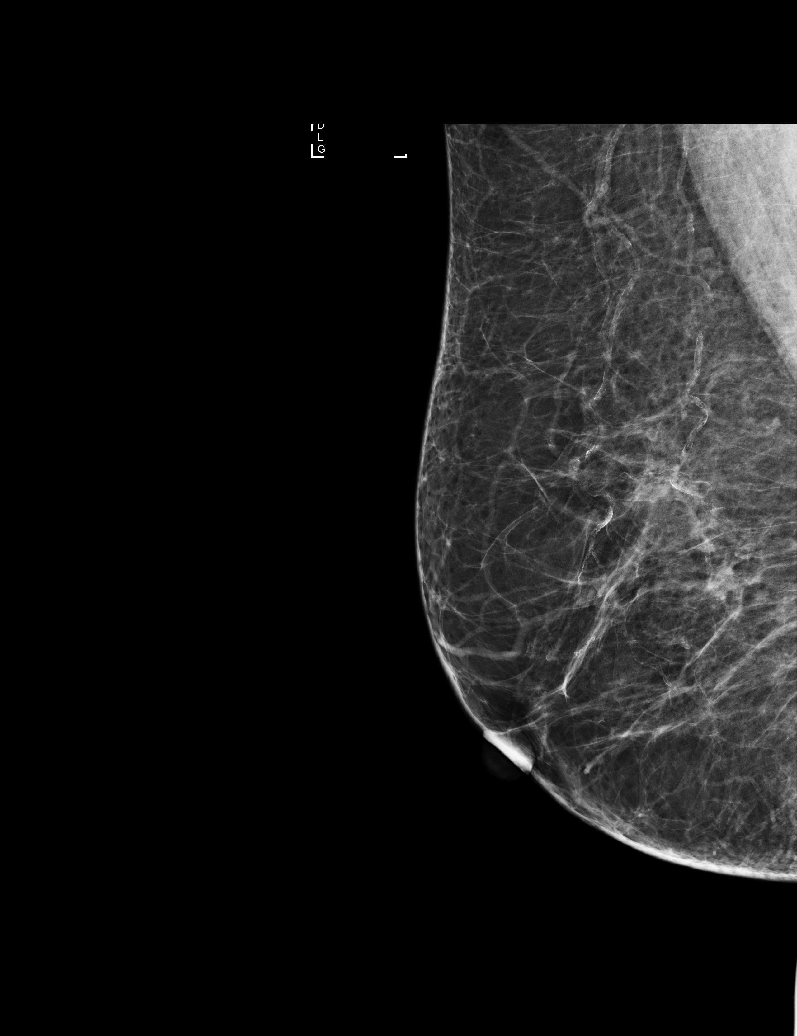

[R CC]
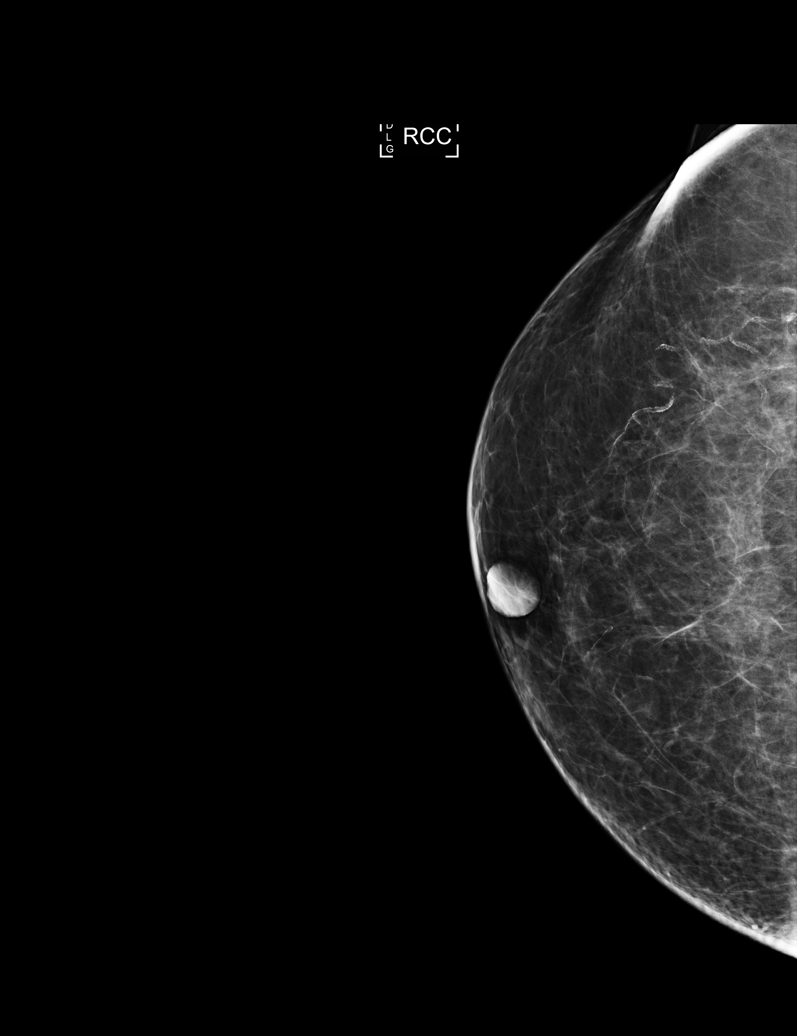

[L MLO]
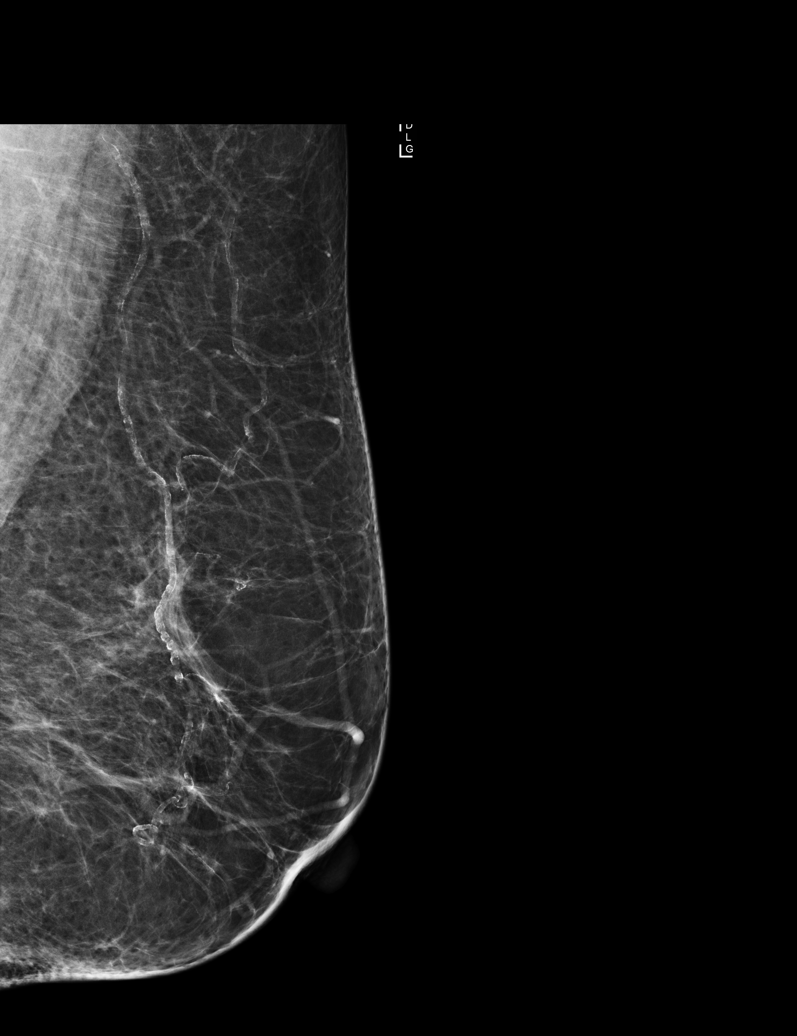

[R MLO (2 of 2)]
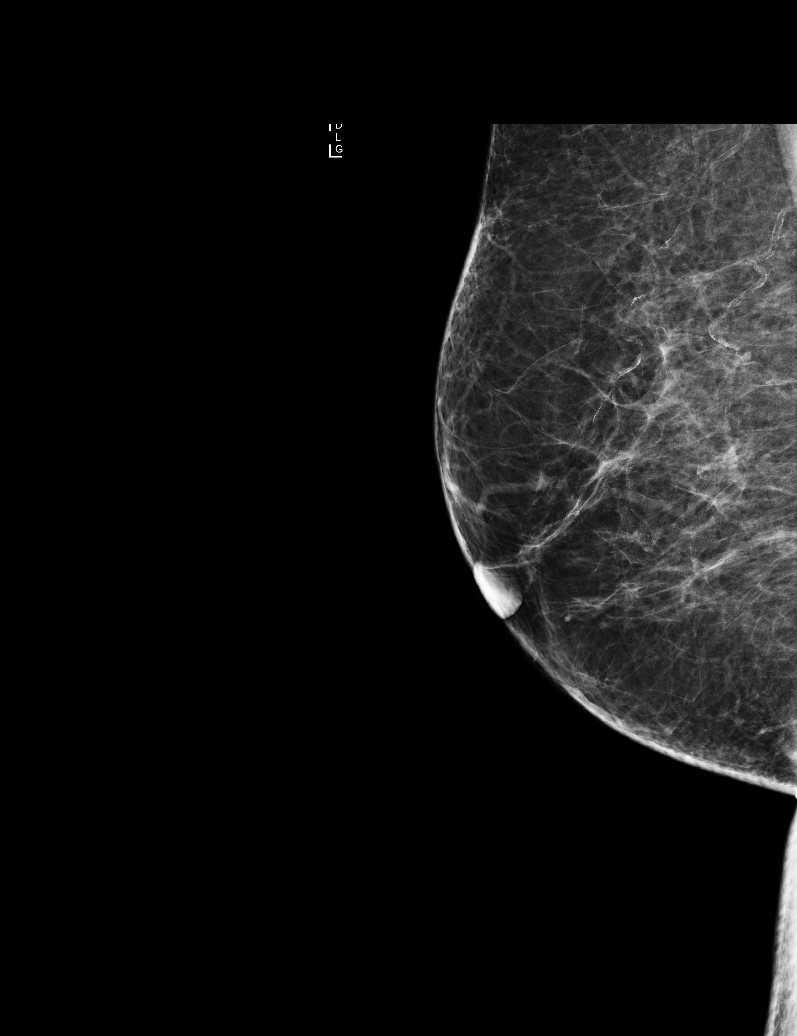

[L CC]
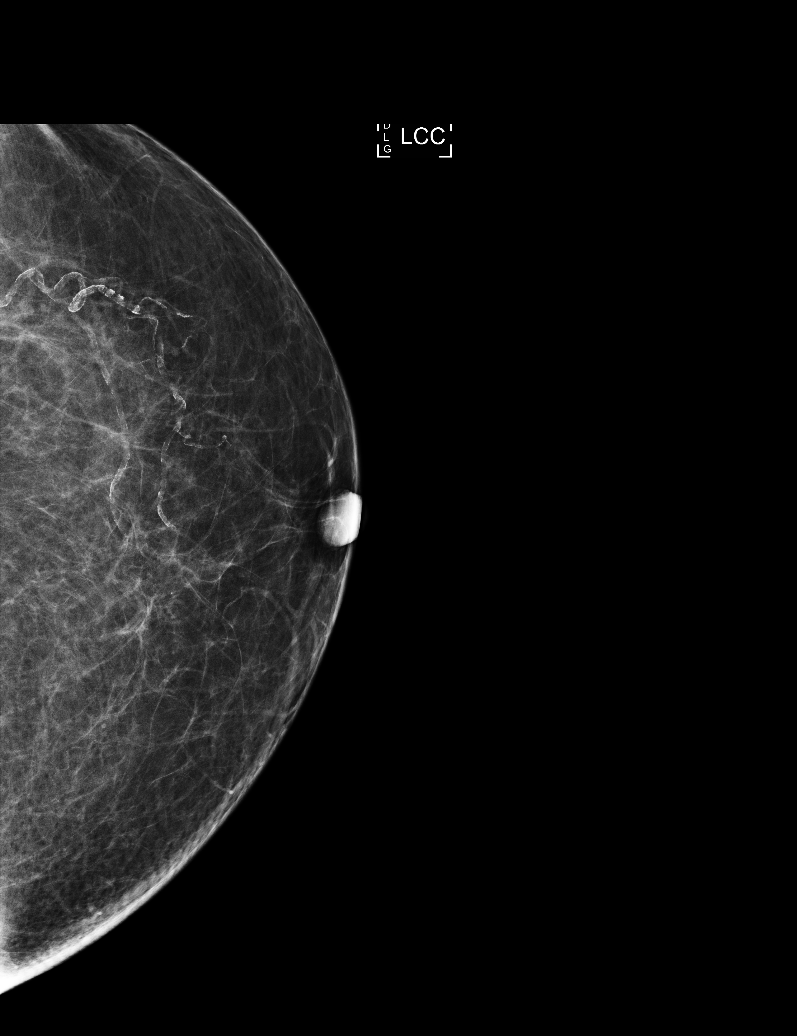

[5 of 5 positions shown; findings below may reference images not displayed]

ACR Breast Density Category b: There are scattered areas of
fibroglandular density.
FINDINGS: There are no findings suspicious for malignancy. Images were
processed with CAD.
IMPRESSION: No mammographic evidence of malignancy. A result letter of this
screening mammogram will be mailed directly to the patient.

RECOMMENDATION:
Screening mammogram in one year. (Code:AS-G-LCT)

BI-RADS CATEGORY  1: Negative.

## 2021-10-16 ENCOUNTER — Ambulatory Visit (INDEPENDENT_AMBULATORY_CARE_PROVIDER_SITE_OTHER): Payer: BC Managed Care – PPO | Admitting: Internal Medicine

## 2021-10-16 ENCOUNTER — Encounter: Payer: Self-pay | Admitting: Internal Medicine

## 2021-10-16 VITALS — BP 130/84 | HR 63 | Ht 63.0 in | Wt 256.2 lb

## 2021-10-16 DIAGNOSIS — R7303 Prediabetes: Secondary | ICD-10-CM

## 2021-10-16 DIAGNOSIS — M899 Disorder of bone, unspecified: Secondary | ICD-10-CM

## 2021-10-16 DIAGNOSIS — E559 Vitamin D deficiency, unspecified: Secondary | ICD-10-CM

## 2021-10-16 DIAGNOSIS — M8580 Other specified disorders of bone density and structure, unspecified site: Secondary | ICD-10-CM | POA: Diagnosis not present

## 2021-10-16 DIAGNOSIS — M949 Disorder of cartilage, unspecified: Secondary | ICD-10-CM

## 2021-10-16 DIAGNOSIS — E039 Hypothyroidism, unspecified: Secondary | ICD-10-CM | POA: Diagnosis not present

## 2021-10-16 DIAGNOSIS — Z1589 Genetic susceptibility to other disease: Secondary | ICD-10-CM | POA: Diagnosis not present

## 2021-10-16 LAB — T4, FREE: Free T4: 0.65 ng/dL (ref 0.60–1.60)

## 2021-10-16 LAB — TSH: TSH: 1.34 u[IU]/mL (ref 0.35–5.50)

## 2021-10-16 LAB — T3, FREE: T3, Free: 3.4 pg/mL (ref 2.3–4.2)

## 2021-10-16 LAB — HEMOGLOBIN A1C: Hgb A1c MFr Bld: 5.9 % (ref 4.6–6.5)

## 2021-10-16 MED ORDER — LIOTHYRONINE SODIUM 5 MCG PO TABS: ORAL_TABLET | ORAL | 3 refills | Status: DC

## 2021-10-16 MED ORDER — LEVOTHYROXINE SODIUM 50 MCG PO TABS: ORAL_TABLET | ORAL | 3 refills | Status: DC

## 2021-10-16 NOTE — Progress Notes (Signed)
Patient ID: Mary Reyes, female   DOB: 03/13/1961, 61 y.o.   MRN: [id number]  HPI  Mary Reyes is a 61 y.o.-year-old female, returning for f/u for hypothyroidism, vitamin D deficiency and osteopenia. Last visit 1 year ago.  Interim history: She continues to have fatigue. She is not exercising. Since last visit, she had another elevated HbA1c.  Hypothyroidism: Reviewed history: She tells me that her thyroid problem started when she found a "lump in her neck" in 2001. She saw ENT at that time, who was following the nodule, which kept growing. She remembers that she was then dx with Mononucleosis. She had a FNA of the nodule and this apparently returned positive for lymphoid tissue. It was considered that this was an enlarged lymph node possibly from mononucleosis. She did not have a thyroid U/S after this episode. She remembers that the nodule did decrease in size after prednisone.  Patient was found to be hyperthyroid during the investigation for her enlarged lymph node. She initially tried oral anti-thyroid medicines, which were not effective. She had RAI tx in 2002. She did not have the start LT4 after her RAI Tx, but became hypothyroid and had to start levothyroxine in 2008. She is not sure why she was recommended to add LT3, but she believes that it was added because of her history of depression.   She is on levothyroxine 50 mcg 6 out of 7 days and 100 mcg 1 out of 7 days + liothyronine 7.5 mcg daily: - in am - fasting - at least 30 min from b'fast - + Ca, PPIs later in the day - no Fe, MVIs - not on Biotin  Reviewed her TFTs: Lab Results  Component Value Date   TSH 1.50 07/15/2021   TSH 0.87 07/09/2020   TSH 1.53 01/10/2020   TSH 0.99 07/06/2019   TSH 1.40 10/20/2018   FREET4 0.64 10/20/2018   FREET4 0.66 10/26/2017   FREET4 0.72 09/08/2016   FREET4 0.84 02/03/2016   FREET4 0.70 01/16/2015  04/03/2014: TSH 0.92, fT4 0.97, fT3 2.8  Pt denies: - feeling nodules in  neck - hoarseness - dysphagia - choking  She has + FH of thyroid disorders in: thyroiditis. No FH of thyroid cancer. + h/o radiation tx to head or neck. No herbal supplements. No recent steroids use.   Vitamin D deficiency: Previously on ergocalciferol, but then changed to 2000 units vitamin D3 daily.  Reviewed vitamin D levels: Lab Results  Component Value Date   VD25OH 42.01 07/15/2021   VD25OH 41.96 07/09/2020   VD25OH 37 01/10/2020   VD25OH 44.77 07/06/2019   VD25OH 47.21 10/20/2018   VD25OH 43.35 10/26/2017   VD25OH 43.80 11/03/2016   VD25OH 42.23 02/03/2016   VD25OH 45.34 07/20/2014   Osteopenia:  Reviewed the available DXA scan reports: 10/29/2019 ([facility]) Lumbar spine L1-L4 Femoral neck (FN) 33% distal radius  T-score -1.5 RFN: -1.7 LFN: -1.5 n/a  Change in BMD from previous DXA test (%) Up 3.5% Down 9.1% n/a  (*) statistically significant  - DXA scan 11/10/2016: L1-L4: T -1.7 RFN: T -1.1 LFN: T -1.0  FRAX MOF risk: 9.4%, Hip fx risk 0.5% - DXA scan 09/05/2014: L1-L4: T -1.6 RFN: T -1.3 LFN: T -1.0 FRAX MOF risk: 5.1%, Hip fx risk 0.3% -She had a left ankle fracture in 01/2016.  She had to have surgery for this.  No fractures since then. -No previous antiresorptive therapy.  She had dysesthesia and I checked her for MTHFR mutation  in the past.  This returned positive for: Component     Latest Ref Rng & Units 12/11/2015  MTHFR      C677T Single mutation (C677T) identified  Vitamin B12     211 - 911 pg/mL 447  Methylmalonic Acid, Quant     87 - 318 nmol/L 111  Vitamin B12 and MMA normal.  Pt is heterozygous for the C667T MTHFR mutation >> this is associated with an approximately 30% decrease in methylation activity.  After the results returned, I called and discussed with the geneticist at [facility], recommended to check a homocystine. If this is high, this could be treated, but otherwise, no intervention is necessary.  Homocysteine was high, so we  discussed about taking her methylated vitamin B consistently.    Latest homocystine level finally normalized: Component     Latest Ref Rng & Units 11/28/2019  Homocysteine     <10.4 umol/L 7.7   Component     Latest Ref Rng & Units 11/03/2016 10/26/2017 10/20/2018  Homocysteine     <10.4 umol/L 10.7 (H) 11.4 (H) 11.6 (H)   Previous endocrinologist Dr. Abel Presto [phone] - in [place]. She also has a history of ?MS, GERD, HTN, OSA, migraines. She also has prediabetes: Lab Results  Component Value Date   HGBA1C 6.0 07/15/2021   HGBA1C 5.8 07/09/2020   HGBA1C 5.5 07/06/2019   Father had Lieber's ds. (adult onset blindness in men) Her younger sister has Lewy body dementia - on hospice -in [place].   ROS: + See HPI  Past Medical History:  Diagnosis Date   Allergy    Anxiety    Arthritis    Blood in stool    Colon polyp    Depression    Eczema    Frequent headaches    GERD (gastroesophageal reflux disease)    Hypertension    Hypothyroidism    Migraine    OSA (obstructive sleep apnea)    CPAP nightly   Osteopenia    Sleep apnea    Thyroid disease    Trimalleolar fracture of ankle, closed, left, sequela    Past Surgical History:  Procedure Laterality Date   ABDOMINAL HYSTERECTOMY  2004   ORIF ANKLE FRACTURE Left 02/12/2016   Procedure: OPEN REDUCTION INTERNAL FIXATION (ORIF) LEFT ANKLE FRACTURE;  Surgeon: Leandrew Koyanagi, MD;  Location: [facility];  Service: Orthopedics;  Laterality: Left;   WISDOM TOOTH EXTRACTION     History   Social History   Marital Status: Married    Spouse Name: N/A   Number of Children:  3    Occupational History   N/a   Social History Main Topics   Smoking status: Never Smoker    Smokeless tobacco: Not on file   Alcohol Use: No   Drug Use: No   Current Outpatient Medications on File Prior to Visit  Medication Sig Dispense Refill   ALPRAZolam (XANAX) 0.25 MG tablet Take 0.25 mg by mouth at bedtime as needed for  anxiety or sleep.   0   ARIPiprazole (ABILIFY) 2 MG tablet Take 2 mg by mouth daily.     Azelastine HCl 0.15 % SOLN Place into the nose.     buPROPion (WELLBUTRIN XL) 300 MG 24 hr tablet Take 300 mg by mouth every morning.     calcium-vitamin D (OSCAL WITH D) 500-200 MG-UNIT tablet Take 1 tablet by mouth 3 (three) times daily. 90 tablet 12   cholecalciferol (VITAMIN D) 1000 units tablet Take  2,000 Units by mouth at bedtime.     cyclobenzaprine (FLEXERIL) 10 MG tablet Take 1 tablet (10 mg total) by mouth 3 (three) times daily as needed for muscle spasms. 30 tablet 1   desvenlafaxine (PRISTIQ) 100 MG 24 hr tablet Take 100 mg by mouth every morning.     famotidine-calcium carbonate-magnesium hydroxide (PEPCID COMPLETE) 10-800-165 MG chewable tablet Chew 1 tablet by mouth daily as needed.     fexofenadine (ALLEGRA) 180 MG tablet Take by mouth.     fluticasone (FLONASE) 50 MCG/ACT nasal spray Place into both nostrils daily.     ibuprofen (ADVIL,MOTRIN) 200 MG tablet Take 400 mg by mouth every 6 (six) hours as needed for headache, mild pain or moderate pain.     levothyroxine (SYNTHROID) 50 MCG tablet TAKE 1 TABLET BY MOUTH EVERY MORNING MONDAY-SATURDAY AND TAKE 2 TABLETS BY MOUTH ON SUNDAY 105 tablet 3   liothyronine (CYTOMEL) 5 MCG tablet TAKE 1 AND 1/2 TABLET BY MOUTH ONCE DAILY 135 tablet 3   lisinopril (ZESTRIL) 10 MG tablet TAKE 1 TABLET BY MOUTH EVERY DAY 90 tablet 1   metoprolol tartrate (LOPRESSOR) 25 MG tablet TAKE 1 TABLET BY MOUTH TWICE A DAY 180 tablet 1   ondansetron (ZOFRAN ODT) 4 MG disintegrating tablet Take 1 tablet (4 mg total) by mouth every 8 (eight) hours as needed. 20 tablet 0   No current facility-administered medications on file prior to visit.   Allergies  Allergen Reactions   Adhesive [Tape] Rash   Benzoyl Peroxide Rash    Benzine    Milk-Related Compounds Rash   Family History  Problem Relation Age of Onset   Healthy Mother    Alcohol abuse Father        Deceased    Stroke Maternal Grandmother    Arthritis Paternal Grandmother    Depression Daughter    Heart murmur Daughter    Allergic rhinitis Daughter    Fibromyalgia Daughter    Allergic rhinitis Daughter    Migraines Son    Allergic rhinitis Son    Brain cancer Sister    Asthma Sister    Angioedema Neg Hx    Eczema Neg Hx    Immunodeficiency Neg Hx    Urticaria Neg Hx    Colon cancer Neg Hx   + see HPI Also: Sisters with hypertension Child with PDA 2 daughters with history of thyroiditis Sister with brain tumor  PE: BP 130/84 (BP Location: Left Arm, Patient Position: Sitting, Cuff Size: Normal)   Pulse 63   Ht '5\' 3"'$  (1.6 m)   Wt 256 lb 3.2 oz (116.2 kg)   SpO2 95%   BMI 45.38 kg/m   Wt Readings from Last 3 Encounters:  10/16/21 256 lb 3.2 oz (116.2 kg)  07/15/21 255 lb 4.8 oz (115.8 kg)  01/07/21 251 lb 12.8 oz (114.2 kg)   Constitutional: overweight, in NAD Eyes: PERRLA, EOMI, no exophthalmos ENT: moist mucous membranes, no thyromegaly, no cervical lymphadenopathy Cardiovascular: RRR, No MRG Respiratory: CTA B Musculoskeletal: no deformities Skin: moist, warm, no rashes Neurological: no tremor with outstretched hands  ASSESSMENT: 1. Hypothyroidism  2. Vitamin D deficiency  3. Osteopenia  4. MTHFR heterozygosity - C677T normal  5.  Elevated HbA1c  PLAN:  1. Hypothyroidism -She has longstanding hypothyroidism on a complex regimen comprised of both levothyroxine and liothyronine therapy - latest thyroid labs reviewed with pt. >> normal: Lab Results  Component Value Date   TSH 1.50 07/15/2021  - she continues on  LT4 50 mcg 6/7 days, 100 mcg 1/7 days + LT3 7.5 mcg daily  - we discussed about possibly simplifying her regimen, but she got used to the above regimen and feels well on it so we decided to continue it - pt feels good on this dose except for fatigue, which is chronic - we discussed about taking the thyroid hormone every day, with water, >30 minutes  before breakfast, separated by >4 hours from acid reflux medications, calcium, iron, multivitamins. Pt. is taking it correctly. - will check thyroid tests today: TSH, fT3 and fT4 - If labs are abnormal, she will need to return for repeat TFTs in 1.5 months - OTW, I will see her back in a year  2. Vitamin D deficiency -She continues on 1000 units vitamin D3 daily -Latest vitamin D level was normal, in 06/2021: 42 -we will not repeat this today  3. Osteopenia -History of ankle fracture in 2017, no fractures since last visit -Reviewed the latest bone density scan reports from 10/29/2019: Worsening of the T-scores at the level of the femoral necks, but improved bone density at the spine -After the above results returned, I advised her about weightbearing exercises (given handout).  However, she did not start these. -She is due for another bone density scan.  I will order this today.  She may need to be on antiresorptive medication afterwards.  4. MTHFR heterozygosity -In the past, homocystine levels were high and we started methylated vitamin B complex.  She was not taking this consistently, but at last visit we again discussed about taking it daily.  She is not taking them consistently. -B12 vitamin was obtained on 07/15/2021 and this was low normal, at 332. -Latest level 2 years ago was normal, though, at 7.7  5.  Prediabetes -Patient had at least 2 HbA1c levels that were higher than target, at 5.8, and then 6.0% in 09/2021.  We discussed that this qualifies her for a diagnosis of prediabetes. -At today's visit, we discussed that prediabetes is reversible with improving diet and starting a consistent exercise regimen. -We will repeat her HbA1c today  Needs refills.  Component     Latest Ref Rng 10/16/2021  TSH     0.35 - 5.50 uIU/mL 1.34   T4,Free(Direct)     0.60 - 1.60 ng/dL 0.65   Triiodothyronine,Free,Serum     2.3 - 4.2 pg/mL 3.4   Hemoglobin A1C     4.6 - 6.5 % 5.9   HbA1c is  slightly lower.  Thyroid tests are normal.  Philemon Kingdom, MD PhD [facility]

## 2021-10-16 NOTE — Patient Instructions (Addendum)
Please continue to take the methylated vitamin B daily.  Please continue Levothyroxine 50 mcg on 6/7 days, and 100 mcg 1/7 days + Cytomel 7.5 mcg.  Take the thyroid hormone every day, with water, at least 30 minutes before breakfast, separated by at least 4 hours from: - acid reflux medications - calcium - iron - multivitamins  Please stop at the lab.  Please call and schedule bone density scan at the [facility]: [phone].   Please return in 1 year.

## 2021-11-06 ENCOUNTER — Ambulatory Visit (AMBULATORY_SURGERY_CENTER): Payer: Self-pay | Admitting: *Deleted

## 2021-11-06 ENCOUNTER — Other Ambulatory Visit: Payer: Self-pay

## 2021-11-06 VITALS — Ht 62.0 in | Wt 256.0 lb

## 2021-11-06 DIAGNOSIS — Z1211 Encounter for screening for malignant neoplasm of colon: Secondary | ICD-10-CM

## 2021-11-06 MED ORDER — NA SULFATE-K SULFATE-MG SULF 17.5-3.13-1.6 GM/177ML PO SOLN: 1.0000 | Freq: Once | ORAL | 0 refills | Status: AC

## 2021-11-06 NOTE — Progress Notes (Signed)
Pre visit completed in person.  No egg or soy allergy known to patient  No issues known to pt with past sedation with any surgeries or procedures Patient denies ever being told they had issues or difficulty with intubation  No FH of Malignant Hyperthermia Pt is not on diet pills Pt is not on  home 02  Pt is not on blood thinners  Pt denies issues with constipation  No A fib or A flutter  Pt instructed to use Singlecare.com or GoodRx for a price reduction on prep   

## 2021-11-11 ENCOUNTER — Inpatient Hospital Stay: Admission: RE | Admit: 2021-11-11 | Payer: BC Managed Care – PPO | Source: Ambulatory Visit

## 2021-11-13 ENCOUNTER — Other Ambulatory Visit: Payer: Self-pay | Admitting: Internal Medicine

## 2021-11-13 ENCOUNTER — Ambulatory Visit (INDEPENDENT_AMBULATORY_CARE_PROVIDER_SITE_OTHER)
Admission: RE | Admit: 2021-11-13 | Discharge: 2021-11-13 | Disposition: A | Discharge status: Home / Self Care | Payer: BC Managed Care – PPO | Source: Ambulatory Visit | Attending: Internal Medicine | Admitting: Internal Medicine

## 2021-11-13 DIAGNOSIS — M949 Disorder of cartilage, unspecified: Secondary | ICD-10-CM

## 2021-11-13 DIAGNOSIS — M899 Disorder of bone, unspecified: Secondary | ICD-10-CM | POA: Diagnosis not present

## 2021-11-14 DIAGNOSIS — M899 Disorder of bone, unspecified: Secondary | ICD-10-CM | POA: Diagnosis not present

## 2021-11-14 DIAGNOSIS — M949 Disorder of cartilage, unspecified: Secondary | ICD-10-CM | POA: Diagnosis not present

## 2021-11-19 ENCOUNTER — Encounter: Payer: Self-pay | Admitting: Internal Medicine

## 2021-11-27 ENCOUNTER — Encounter: Payer: Self-pay | Admitting: Internal Medicine

## 2021-11-27 ENCOUNTER — Ambulatory Visit (AMBULATORY_SURGERY_CENTER): Payer: BC Managed Care – PPO | Admitting: Internal Medicine

## 2021-11-27 VITALS — BP 150/68 | HR 56 | Temp 96.6°F | Resp 18 | Ht 63.0 in | Wt 256.0 lb

## 2021-11-27 DIAGNOSIS — Z09 Encounter for follow-up examination after completed treatment for conditions other than malignant neoplasm: Secondary | ICD-10-CM | POA: Diagnosis not present

## 2021-11-27 DIAGNOSIS — Z86010 Personal history of colonic polyps: Secondary | ICD-10-CM

## 2021-11-27 DIAGNOSIS — D123 Benign neoplasm of transverse colon: Secondary | ICD-10-CM | POA: Diagnosis not present

## 2021-11-27 DIAGNOSIS — Z1211 Encounter for screening for malignant neoplasm of colon: Secondary | ICD-10-CM

## 2021-11-27 DIAGNOSIS — D124 Benign neoplasm of descending colon: Secondary | ICD-10-CM

## 2021-11-27 MED ORDER — SODIUM CHLORIDE 0.9 % IV SOLN: 500.0000 mL | Freq: Once | INTRAVENOUS | Status: DC

## 2021-11-27 NOTE — Op Note (Signed)
[facility] Patient Name: Mary Reyes Procedure Date: 11/27/2021 11:11 AM MRN: [id number] Endoscopist: Jerene Bears , MD Age: 61 Referring MD:  Date of Birth: 1960/05/26 Gender: Female Account #: [id number] Procedure:                Colonoscopy Indications:              High risk colon cancer surveillance: Personal                            history of colonic polyps, Last colonoscopy: July                            2018 (cecal "hyperplastic" presumed SSP) Medicines:                Monitored Anesthesia Care Procedure:                Pre-Anesthesia Assessment:                           - Prior to the procedure, a History and Physical                            was performed, and patient medications and                            allergies were reviewed. The patient's tolerance of                            previous anesthesia was also reviewed. The risks                            and benefits of the procedure and the sedation                            options and risks were discussed with the patient.                            All questions were answered, and informed consent                            was obtained. Prior Anticoagulants: The patient has                            taken no previous anticoagulant or antiplatelet                            agents. ASA Grade Assessment: III - A patient with                            severe systemic disease. After reviewing the risks                            and benefits, the patient was deemed in  satisfactory condition to undergo the procedure.                           After obtaining informed consent, the colonoscope                            was passed under direct vision. Throughout the                            procedure, the patient's blood pressure, pulse, and                            oxygen saturations were monitored continuously. The                            PCF-HQ190L Colonoscope  was introduced through the                            anus and advanced to the cecum, identified by                            appendiceal orifice and ileocecal valve. The                            colonoscopy was performed without difficulty. The                            patient tolerated the procedure well. The quality                            of the bowel preparation was good. The ileocecal                            valve, appendiceal orifice, and rectum were                            photographed. Scope In: 11:15:44 AM Scope Out: 11:36:02 AM Scope Withdrawal Time: 0 hours 14 minutes 5 seconds  Total Procedure Duration: 0 hours 20 minutes 18 seconds  Findings:                 The digital rectal exam was normal.                           A 5 mm polyp was found in the transverse colon. The                            polyp was sessile. The polyp was removed with a                            cold snare. Resection and retrieval were complete.                           Two sessile polyps were found in the descending  colon. The polyps were 4 to 5 mm in size. These                            polyps were removed with a cold snare. Resection                            and retrieval were complete.                           The exam was otherwise without abnormality on                            direct and retroflexion views. Complications:            No immediate complications. Estimated Blood Loss:     Estimated blood loss was minimal. Impression:               - One 5 mm polyp in the transverse colon, removed                            with a cold snare. Resected and retrieved.                           - Two 4 to 5 mm polyps in the descending colon,                            removed with a cold snare. Resected and retrieved.                           - The examination was otherwise normal on direct                            and retroflexion  views. Recommendation:           - Patient has a contact number available for                            emergencies. The signs and symptoms of potential                            delayed complications were discussed with the                            patient. Return to normal activities tomorrow.                            Written discharge instructions were provided to the                            patient.                           - Resume previous diet.                           -  Continue present medications.                           - Await pathology results.                           - Repeat colonoscopy is recommended for                            surveillance. The colonoscopy date will be                            determined after pathology results from today's                            exam become available for review. Jerene Bears, MD 11/27/2021 11:39:03 AM This report has been signed electronically.

## 2021-11-27 NOTE — Progress Notes (Signed)
Report to PACU, RN, vss, BBS= Clear.  

## 2021-11-27 NOTE — Patient Instructions (Signed)
Handout given for polyps.  YOU HAD AN ENDOSCOPIC PROCEDURE TODAY AT [facility]:   Refer to the procedure report that was given to you for any specific questions about what was found during the examination.  If the procedure report does not answer your questions, please call your gastroenterologist to clarify.  If you requested that your care partner not be given the details of your procedure findings, then the procedure report has been included in a sealed envelope for you to review at your convenience later.  YOU SHOULD EXPECT: Some feelings of bloating in the abdomen. Passage of more gas than usual.  Walking can help get rid of the air that was put into your GI tract during the procedure and reduce the bloating. If you had a lower endoscopy (such as a colonoscopy or flexible sigmoidoscopy) you may notice spotting of blood in your stool or on the toilet paper. If you underwent a bowel prep for your procedure, you may not have a normal bowel movement for a few days.  Please Note:  You might notice some irritation and congestion in your nose or some drainage.  This is from the oxygen used during your procedure.  There is no need for concern and it should clear up in a day or so.  SYMPTOMS TO REPORT IMMEDIATELY:  Following lower endoscopy (colonoscopy or flexible sigmoidoscopy):  Excessive amounts of blood in the stool  Significant tenderness or worsening of abdominal pains  Swelling of the abdomen that is new, acute  Fever of 100F or higher  For urgent or emergent issues, a gastroenterologist can be reached at any hour by calling [phone]. Do not use MyChart messaging for urgent concerns.    DIET:  We do recommend a small meal at first, but then you may proceed to your regular diet.  Drink plenty of fluids but you should avoid alcoholic beverages for 24 hours.  ACTIVITY:  You should plan to take it easy for the rest of today and you should NOT DRIVE or use heavy  machinery until tomorrow (because of the sedation medicines used during the test).    FOLLOW UP: Our staff will call the number listed on your records the next business day following your procedure.  We will call around 7:15- 8:00 am to check on you and address any questions or concerns that you may have regarding the information given to you following your procedure. If we do not reach you, we will leave a message.  If you develop any symptoms (ie: fever, flu-like symptoms, shortness of breath, cough etc.) before then, please call [phone].  If you test positive for Covid 19 in the 2 weeks post procedure, please call and report this information to [place].    If any biopsies were taken you will be contacted by phone or by letter within the next 1-3 weeks.  Please call us at [phone] if you have not heard about the biopsies in 3 weeks.    SIGNATURES/CONFIDENTIALITY: You and/or your care partner have signed paperwork which will be entered into your electronic medical record.  These signatures attest to the fact that that the information above on your After Visit Summary has been reviewed and is understood.  Full responsibility of the confidentiality of this discharge information lies with you and/or your care-partner.

## 2021-11-27 NOTE — Progress Notes (Signed)
Pt's states no medical or surgical changes since previsit or office visit. 

## 2021-11-27 NOTE — Progress Notes (Signed)
GASTROENTEROLOGY PROCEDURE H&P NOTE   Primary Care Physician: Isaac Bliss, Rayford Halsted, MD    Reason for Procedure:  History of colon polyp  Plan:    Colonoscopy  Patient is appropriate for endoscopic procedure(s) in the ambulatory ([place]) setting.  The nature of the procedure, as well as the risks, benefits, and alternatives were carefully and thoroughly reviewed with the patient. Ample time for discussion and questions allowed. The patient understood, was satisfied, and agreed to proceed.     HPI: Mary Reyes is a 61 y.o. female who presents for surveillance colonoscopy.  Medical history as below.  Tolerated the prep.  No recent chest pain or shortness of breath.  No abdominal pain today.  Past Medical History:  Diagnosis Date   Allergy    Anxiety    Arthritis    Blood in stool    Colon polyp    Depression    Eczema    Frequent headaches    GERD (gastroesophageal reflux disease)    Hypertension    Hypothyroidism    Migraine    OSA (obstructive sleep apnea)    CPAP nightly   Osteopenia    Sleep apnea    Thyroid disease    Trimalleolar fracture of ankle, closed, left, sequela     Past Surgical History:  Procedure Laterality Date   ABDOMINAL HYSTERECTOMY  2004   ORIF ANKLE FRACTURE Left 02/12/2016   Procedure: OPEN REDUCTION INTERNAL FIXATION (ORIF) LEFT ANKLE FRACTURE;  Surgeon: Leandrew Koyanagi, MD;  Location: [facility];  Service: Orthopedics;  Laterality: Left;   WISDOM TOOTH EXTRACTION      Prior to Admission medications   Medication Sig Start Date End Date Taking? Authorizing Provider  ARIPiprazole (ABILIFY) 2 MG tablet Take 2 mg by mouth daily.   Yes [provider]  Azelastine HCl 0.15 % SOLN Place into the nose.   Yes [provider]  b complex vitamins capsule Take 1 capsule by mouth daily.   Yes [provider]  buPROPion (WELLBUTRIN XL) 300 MG 24 hr tablet Take 300 mg by mouth every morning. 12/23/19   Yes [provider]  calcium-vitamin D (OSCAL WITH D) 500-200 MG-UNIT tablet Take 1 tablet by mouth 3 (three) times daily. 02/12/16  Yes Leandrew Koyanagi, MD  cholecalciferol (VITAMIN D) 1000 units tablet Take 2,000 Units by mouth at bedtime.   Yes [provider]  desvenlafaxine (PRISTIQ) 100 MG 24 hr tablet Take 100 mg by mouth every morning. 11/18/19  Yes [provider]  famotidine-calcium carbonate-magnesium hydroxide (PEPCID COMPLETE) 10-800-165 MG chewable tablet Chew 1 tablet by mouth daily as needed.   Yes [provider]  fexofenadine (ALLEGRA) 180 MG tablet Take by mouth.   Yes [provider]  fluticasone (FLONASE) 50 MCG/ACT nasal spray Place into both nostrils daily.   Yes [provider]  levothyroxine (SYNTHROID) 50 MCG tablet TAKE 1 TABLET BY MOUTH EVERY MORNING MONDAY-SATURDAY AND TAKE 2 TABLETS BY MOUTH ON SUNDAY 10/16/21  Yes Philemon Kingdom, MD  liothyronine (CYTOMEL) 5 MCG tablet TAKE 1 AND 1/2 TABLET BY MOUTH ONCE DAILY 10/16/21  Yes Philemon Kingdom, MD  lisinopril (ZESTRIL) 10 MG tablet TAKE 1 TABLET BY MOUTH EVERY DAY 08/18/21  Yes Isaac Bliss, Rayford Halsted, MD  metoprolol tartrate (LOPRESSOR) 25 MG tablet TAKE 1 TABLET BY MOUTH TWICE A DAY 08/18/21  Yes Isaac Bliss, Rayford Halsted, MD  ALPRAZolam Duanne Moron) 0.25 MG tablet Take 0.25 mg by mouth at bedtime  as needed for anxiety or sleep.  Patient not taking: Reported on 11/27/2021    [provider]  cyclobenzaprine (FLEXERIL) 10 MG tablet Take 1 tablet (10 mg total) by mouth 3 (three) times daily as needed for muscle spasms. 07/06/19   Isaac Bliss, Rayford Halsted, MD  ibuprofen (ADVIL,MOTRIN) 200 MG tablet Take 400 mg by mouth every 6 (six) hours as needed for headache, mild pain or moderate pain.    [provider]  ondansetron (ZOFRAN ODT) 4 MG disintegrating tablet Take 1 tablet (4 mg total) by mouth every 8 (eight) hours as needed. Patient not taking: Reported  on 11/06/2021 09/18/20   Joanne Gavel, PA-C    Current Outpatient Medications  Medication Sig Dispense Refill   ARIPiprazole (ABILIFY) 2 MG tablet Take 2 mg by mouth daily.     Azelastine HCl 0.15 % SOLN Place into the nose.     b complex vitamins capsule Take 1 capsule by mouth daily.     buPROPion (WELLBUTRIN XL) 300 MG 24 hr tablet Take 300 mg by mouth every morning.     calcium-vitamin D (OSCAL WITH D) 500-200 MG-UNIT tablet Take 1 tablet by mouth 3 (three) times daily. 90 tablet 12   cholecalciferol (VITAMIN D) 1000 units tablet Take 2,000 Units by mouth at bedtime.     desvenlafaxine (PRISTIQ) 100 MG 24 hr tablet Take 100 mg by mouth every morning.     famotidine-calcium carbonate-magnesium hydroxide (PEPCID COMPLETE) 10-800-165 MG chewable tablet Chew 1 tablet by mouth daily as needed.     fexofenadine (ALLEGRA) 180 MG tablet Take by mouth.     fluticasone (FLONASE) 50 MCG/ACT nasal spray Place into both nostrils daily.     levothyroxine (SYNTHROID) 50 MCG tablet TAKE 1 TABLET BY MOUTH EVERY MORNING MONDAY-SATURDAY AND TAKE 2 TABLETS BY MOUTH ON SUNDAY 105 tablet 3   liothyronine (CYTOMEL) 5 MCG tablet TAKE 1 AND 1/2 TABLET BY MOUTH ONCE DAILY 135 tablet 3   lisinopril (ZESTRIL) 10 MG tablet TAKE 1 TABLET BY MOUTH EVERY DAY 90 tablet 1   metoprolol tartrate (LOPRESSOR) 25 MG tablet TAKE 1 TABLET BY MOUTH TWICE A DAY 180 tablet 1   ALPRAZolam (XANAX) 0.25 MG tablet Take 0.25 mg by mouth at bedtime as needed for anxiety or sleep.  (Patient not taking: Reported on 11/27/2021)  0   cyclobenzaprine (FLEXERIL) 10 MG tablet Take 1 tablet (10 mg total) by mouth 3 (three) times daily as needed for muscle spasms. 30 tablet 1   ibuprofen (ADVIL,MOTRIN) 200 MG tablet Take 400 mg by mouth every 6 (six) hours as needed for headache, mild pain or moderate pain.     ondansetron (ZOFRAN ODT) 4 MG disintegrating tablet Take 1 tablet (4 mg total) by mouth every 8 (eight) hours as needed. (Patient not  taking: Reported on 11/06/2021) 20 tablet 0   Current Facility-Administered Medications  Medication Dose Route Frequency Provider Last Rate Last Admin   0.9 %  sodium chloride infusion  500 mL Intravenous Once Amonte Brookover, Lajuan Lines, MD        Allergies as of 11/27/2021 - Review Complete 11/27/2021  Allergen Reaction Noted   Adhesive [tape] Rash 01/16/2015   Benzoyl peroxide Rash 06/25/2014   Milk-related compounds Rash 02/08/2016    Family History  Problem Relation Age of Onset   Healthy Mother    Alcohol abuse Father        Deceased   Brain cancer Sister    Asthma Sister  Stroke Maternal Grandmother    Arthritis Paternal Grandmother    Depression Daughter    Heart murmur Daughter    Allergic rhinitis Daughter    Fibromyalgia Daughter    Allergic rhinitis Daughter    Migraines Son    Allergic rhinitis Son    Angioedema Neg Hx    Eczema Neg Hx    Immunodeficiency Neg Hx    Urticaria Neg Hx    Colon cancer Neg Hx    Esophageal cancer Neg Hx    Stomach cancer Neg Hx    Rectal cancer Neg Hx     Social History   Socioeconomic History   Marital status: Married    Spouse name: Not on file   Number of children: 3   Years of education: Not on file   Highest education level: Not on file  Occupational History   Occupation: [employer]  Tobacco Use   Smoking status: Never   Smokeless tobacco: Never  Vaping Use   Vaping Use: Never used  Substance and Sexual Activity   Alcohol use: No    Alcohol/week: 0.0 standard drinks of alcohol   Drug use: No   Sexual activity: Not on file  Other Topics Concern   Not on file  Social History Narrative   She lives with husband.  They have three grown children.   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    She is not on disability.     Highest level of education:  Some college   Social Determinants of Health   Financial Resource Strain: Not on file  Food Insecurity: Not on file  Transportation Needs:  Not on file  Physical Activity: Not on file  Stress: Not on file  Social Connections: Not on file  Intimate Partner Violence: Not on file    Physical Exam: Vital signs in last 24 hours: '@BP'$  (!) 178/95   Pulse (!) 58   Temp (!) 96.6 F (35.9 C) (Temporal)   Ht '5\' 3"'$  (1.6 m)   Wt 256 lb (116.1 kg)   SpO2 99%   BMI 45.35 kg/m  GEN: NAD EYE: Sclerae anicteric ENT: MMM CV: Non-tachycardic Pulm: CTA b/l GI: Soft, NT/ND NEURO:  Alert & Oriented x 3   Zenovia Jarred, MD [facility]  11/27/2021 11:07 AM

## 2021-11-27 NOTE — Progress Notes (Signed)
Called to room to assist during endoscopic procedure.  Patient ID and intended procedure confirmed with present staff. Received instructions for my participation in the procedure from the performing physician.  

## 2021-11-28 ENCOUNTER — Telehealth: Payer: Self-pay

## 2021-11-28 NOTE — Telephone Encounter (Signed)
  Follow up Call-     11/27/2021   10:37 AM  Call back number  Post procedure Call Back phone  # [phone]  Permission to leave phone message Yes     Patient questions:  Do you have a fever, pain , or abdominal swelling? No. Pain Score  0 *  Have you tolerated food without any problems? Yes.    Have you been able to return to your normal activities? Yes.    Do you have any questions about your discharge instructions: Diet   No. Medications  No. Follow up visit  No.  Do you have questions or concerns about your Care? No.  Actions: * If pain score is 4 or above: No action needed, pain <4.

## 2021-12-08 ENCOUNTER — Encounter: Payer: Self-pay | Admitting: Internal Medicine

## 2022-01-08 ENCOUNTER — Other Ambulatory Visit (INDEPENDENT_AMBULATORY_CARE_PROVIDER_SITE_OTHER): Payer: BC Managed Care – PPO

## 2022-01-08 DIAGNOSIS — R7302 Impaired glucose tolerance (oral): Secondary | ICD-10-CM | POA: Diagnosis not present

## 2022-01-08 LAB — HEMOGLOBIN A1C: Hgb A1c MFr Bld: 5.9 % (ref 4.6–6.5)

## 2022-01-14 ENCOUNTER — Ambulatory Visit (INDEPENDENT_AMBULATORY_CARE_PROVIDER_SITE_OTHER): Payer: BC Managed Care – PPO | Admitting: Internal Medicine

## 2022-01-14 ENCOUNTER — Encounter: Payer: Self-pay | Admitting: Internal Medicine

## 2022-01-14 VITALS — BP 130/80 | HR 58 | Temp 98.6°F | Wt 257.3 lb

## 2022-01-14 DIAGNOSIS — I10 Essential (primary) hypertension: Secondary | ICD-10-CM

## 2022-01-14 DIAGNOSIS — Z23 Encounter for immunization: Secondary | ICD-10-CM | POA: Diagnosis not present

## 2022-01-14 DIAGNOSIS — R7302 Impaired glucose tolerance (oral): Secondary | ICD-10-CM | POA: Diagnosis not present

## 2022-01-14 DIAGNOSIS — E039 Hypothyroidism, unspecified: Secondary | ICD-10-CM

## 2022-01-14 DIAGNOSIS — F331 Major depressive disorder, recurrent, moderate: Secondary | ICD-10-CM | POA: Diagnosis not present

## 2022-01-14 DIAGNOSIS — E559 Vitamin D deficiency, unspecified: Secondary | ICD-10-CM

## 2022-01-14 NOTE — Progress Notes (Signed)
Established Patient Office Visit     CC/Reason for Visit: 78-monthfollow-up chronic medical conditions  HPI: PConnee Ikneris a 61y.o. female who is coming in today for the above mentioned reasons. Past Medical History is significant for: Hypertension, impaired glucose tolerance, hypothyroidism followed by endocrinology, obstructive sleep apnea on nightly CPAP, GERD, depression, vitamin D deficiency.  She is feeling well today and has no acute concerns.  She is requesting a flu vaccine.   Past Medical/Surgical History: Past Medical History:  Diagnosis Date   Allergy    Anxiety    Arthritis    Blood in stool    Colon polyp    Depression    Eczema    Frequent headaches    GERD (gastroesophageal reflux disease)    Hypertension    Hypothyroidism    Migraine    OSA (obstructive sleep apnea)    CPAP nightly   Osteopenia    Sleep apnea    Thyroid disease    Trimalleolar fracture of ankle, closed, left, sequela     Past Surgical History:  Procedure Laterality Date   ABDOMINAL HYSTERECTOMY  2004   ORIF ANKLE FRACTURE Left 02/12/2016   Procedure: OPEN REDUCTION INTERNAL FIXATION (ORIF) LEFT ANKLE FRACTURE;  Surgeon: NLeandrew Koyanagi MD;  Location: [facility]  Service: Orthopedics;  Laterality: Left;   WISDOM TOOTH EXTRACTION      Social History:  reports that she has never smoked. She has never used smokeless tobacco. She reports that she does not drink alcohol and does not use drugs.  Allergies: Allergies  Allergen Reactions   Adhesive [Tape] Rash   Benzoyl Peroxide Rash    Benzine    Milk-Related Compounds Rash    Family History:  Family History  Problem Relation Age of Onset   Healthy Mother    Alcohol abuse Father        Deceased   Brain cancer Sister    Asthma Sister    Stroke Maternal Grandmother    Arthritis Paternal Grandmother    Depression Daughter    Heart murmur Daughter    Allergic rhinitis Daughter    Fibromyalgia  Daughter    Allergic rhinitis Daughter    Migraines Son    Allergic rhinitis Son    Angioedema Neg Hx    Eczema Neg Hx    Immunodeficiency Neg Hx    Urticaria Neg Hx    Colon cancer Neg Hx    Esophageal cancer Neg Hx    Stomach cancer Neg Hx    Rectal cancer Neg Hx      Current Outpatient Medications:    ALPRAZolam (XANAX) 0.25 MG tablet, Take 0.25 mg by mouth at bedtime as needed for anxiety or sleep., Disp: , Rfl: 0   ARIPiprazole (ABILIFY) 2 MG tablet, Take 2 mg by mouth daily., Disp: , Rfl:    b complex vitamins capsule, Take 1 capsule by mouth daily., Disp: , Rfl:    buPROPion (WELLBUTRIN XL) 300 MG 24 hr tablet, Take 300 mg by mouth every morning., Disp: , Rfl:    calcium-vitamin D (OSCAL WITH D) 500-200 MG-UNIT tablet, Take 1 tablet by mouth 3 (three) times daily., Disp: 90 tablet, Rfl: 12   cholecalciferol (VITAMIN D) 1000 units tablet, Take 2,000 Units by mouth at bedtime., Disp: , Rfl:    cyclobenzaprine (FLEXERIL) 10 MG tablet, Take 1 tablet (10 mg total) by mouth 3 (three) times daily as needed for muscle spasms., Disp:  30 tablet, Rfl: 1   desvenlafaxine (PRISTIQ) 100 MG 24 hr tablet, Take 100 mg by mouth every morning., Disp: , Rfl:    famotidine-calcium carbonate-magnesium hydroxide (PEPCID COMPLETE) 10-800-165 MG chewable tablet, Chew 1 tablet by mouth daily as needed., Disp: , Rfl:    fexofenadine (ALLEGRA) 180 MG tablet, Take by mouth., Disp: , Rfl:    fluticasone (FLONASE) 50 MCG/ACT nasal spray, Place into both nostrils daily., Disp: , Rfl:    ibuprofen (ADVIL,MOTRIN) 200 MG tablet, Take 400 mg by mouth every 6 (six) hours as needed for headache, mild pain or moderate pain., Disp: , Rfl:    levothyroxine (SYNTHROID) 50 MCG tablet, TAKE 1 TABLET BY MOUTH EVERY MORNING MONDAY-SATURDAY AND TAKE 2 TABLETS BY MOUTH ON SUNDAY, Disp: 105 tablet, Rfl: 3   liothyronine (CYTOMEL) 5 MCG tablet, TAKE 1 AND 1/2 TABLET BY MOUTH ONCE DAILY, Disp: 135 tablet, Rfl: 3   lisinopril  (ZESTRIL) 10 MG tablet, TAKE 1 TABLET BY MOUTH EVERY DAY, Disp: 90 tablet, Rfl: 1   metoprolol tartrate (LOPRESSOR) 25 MG tablet, TAKE 1 TABLET BY MOUTH TWICE A DAY, Disp: 180 tablet, Rfl: 1   ondansetron (ZOFRAN ODT) 4 MG disintegrating tablet, Take 1 tablet (4 mg total) by mouth every 8 (eight) hours as needed., Disp: 20 tablet, Rfl: 0   Azelastine HCl 0.15 % SOLN, Place into the nose., Disp: , Rfl:   Review of Systems:  Constitutional: Denies fever, chills, diaphoresis, appetite change and fatigue.  HEENT: Denies photophobia, eye pain, redness, hearing loss, ear pain, congestion, sore throat, rhinorrhea, sneezing, mouth sores, trouble swallowing, neck pain, neck stiffness and tinnitus.   Respiratory: Denies SOB, DOE, cough, chest tightness,  and wheezing.   Cardiovascular: Denies chest pain, palpitations and leg swelling.  Gastrointestinal: Denies nausea, vomiting, abdominal pain, diarrhea, constipation, blood in stool and abdominal distention.  Genitourinary: Denies dysuria, urgency, frequency, hematuria, flank pain and difficulty urinating.  Endocrine: Denies: hot or cold intolerance, sweats, changes in hair or nails, polyuria, polydipsia. Musculoskeletal: Denies myalgias, back pain, joint swelling, arthralgias and gait problem.  Skin: Denies pallor, rash and wound.  Neurological: Denies dizziness, seizures, syncope, weakness, light-headedness, numbness and headaches.  Hematological: Denies adenopathy. Easy bruising, personal or family bleeding history  Psychiatric/Behavioral: Denies suicidal ideation, mood changes, confusion, nervousness, sleep disturbance and agitation    Physical Exam: Vitals:   01/14/22 1002  BP: 130/80  Pulse: (!) 58  Temp: 98.6 F (37 C)  TempSrc: Oral  SpO2: 97%  Weight: 257 lb 4.8 oz (116.7 kg)    Body mass index is 45.58 kg/m.   Constitutional: NAD, calm, comfortable, obese Eyes:, PERRL, lids and conjunctivae normal, wears corrective lenses ENMT:  Mucous membranes are moist.  Respiratory: clear to auscultation bilaterally, no wheezing, no crackles. Normal respiratory effort. No accessory muscle use.  Cardiovascular: Regular rate and rhythm, no murmurs / rubs / gallops. No extremity edema.  Psychiatric: Normal judgment and insight. Alert and oriented x 3. Normal mood.    Impression and Plan:  IGT (impaired glucose tolerance)  Acquired hypothyroidism  Essential hypertension  Moderate episode of recurrent major depressive disorder (HCC)  Vitamin D deficiency  Need for influenza vaccination  -A1c remains in the prediabetic range at 5.9.  Lifestyle modifications have been encouraged. -Blood pressure is fairly well controlled. -Mood is stable. -She follows with endocrine for her TSH and levels were within normal limits. -Flu vaccine administered today. -Discussed healthy lifestyle, including increased physical activity and better food choices to promote weight  loss.   Time spent:31 minutes reviewing chart, interviewing and examining patient and formulating plan of care.     Lelon Frohlich, MD [facility] at [facility]

## 2022-01-14 NOTE — Addendum Note (Signed)
Addended by: Westley Hummer B on: 01/14/2022 10:36 AM   Modules accepted: Orders

## 2022-02-03 DIAGNOSIS — F419 Anxiety disorder, unspecified: Secondary | ICD-10-CM | POA: Diagnosis not present

## 2022-02-03 DIAGNOSIS — F3341 Major depressive disorder, recurrent, in partial remission: Secondary | ICD-10-CM | POA: Diagnosis not present

## 2022-02-16 ENCOUNTER — Other Ambulatory Visit: Payer: Self-pay | Admitting: Internal Medicine

## 2022-02-16 DIAGNOSIS — I10 Essential (primary) hypertension: Secondary | ICD-10-CM

## 2022-03-31 DIAGNOSIS — F3341 Major depressive disorder, recurrent, in partial remission: Secondary | ICD-10-CM | POA: Diagnosis not present

## 2022-03-31 DIAGNOSIS — F419 Anxiety disorder, unspecified: Secondary | ICD-10-CM | POA: Diagnosis not present

## 2022-05-06 ENCOUNTER — Other Ambulatory Visit: Payer: Self-pay | Admitting: *Deleted

## 2022-05-06 DIAGNOSIS — I10 Essential (primary) hypertension: Secondary | ICD-10-CM

## 2022-05-06 MED ORDER — METOPROLOL TARTRATE 25 MG PO TABS: 25.0000 mg | ORAL_TABLET | Freq: Two times a day (BID) | ORAL | 1 refills | Status: DC

## 2022-05-06 MED ORDER — LISINOPRIL 10 MG PO TABS: 10.0000 mg | ORAL_TABLET | Freq: Every day | ORAL | 1 refills | Status: DC

## 2022-05-08 ENCOUNTER — Telehealth: Payer: Self-pay | Admitting: Internal Medicine

## 2022-05-08 ENCOUNTER — Other Ambulatory Visit: Payer: Self-pay

## 2022-05-08 MED ORDER — LEVOTHYROXINE SODIUM 50 MCG PO TABS: ORAL_TABLET | ORAL | 3 refills | Status: DC

## 2022-05-08 MED ORDER — LIOTHYRONINE SODIUM 5 MCG PO TABS: ORAL_TABLET | ORAL | 3 refills | Status: DC

## 2022-05-08 NOTE — Telephone Encounter (Signed)
Patient would like to change her pharmacy to [facility] for any long term medications.  [place], [place] (Ph: [phone])

## 2022-05-08 NOTE — Telephone Encounter (Signed)
done 

## 2022-05-21 ENCOUNTER — Telehealth: Payer: Self-pay | Admitting: Internal Medicine

## 2022-05-21 NOTE — Telephone Encounter (Signed)
Handicap Placard to be filled out--placed in dr's folder.  Call [phone] upon completion for pickup.

## 2022-05-27 NOTE — Telephone Encounter (Signed)
Placed in Dr Hernandez's folder 

## 2022-06-01 NOTE — Telephone Encounter (Signed)
Form is ready to be picked up and the patient is aware

## 2022-07-16 ENCOUNTER — Ambulatory Visit (INDEPENDENT_AMBULATORY_CARE_PROVIDER_SITE_OTHER): Payer: BC Managed Care – PPO | Admitting: Internal Medicine

## 2022-07-16 ENCOUNTER — Encounter: Payer: Self-pay | Admitting: Internal Medicine

## 2022-07-16 VITALS — BP 120/78 | HR 56 | Temp 98.3°F | Ht 63.0 in | Wt 254.0 lb

## 2022-07-16 DIAGNOSIS — Z114 Encounter for screening for human immunodeficiency virus [HIV]: Secondary | ICD-10-CM

## 2022-07-16 DIAGNOSIS — E039 Hypothyroidism, unspecified: Secondary | ICD-10-CM | POA: Diagnosis not present

## 2022-07-16 DIAGNOSIS — E559 Vitamin D deficiency, unspecified: Secondary | ICD-10-CM | POA: Diagnosis not present

## 2022-07-16 DIAGNOSIS — I10 Essential (primary) hypertension: Secondary | ICD-10-CM

## 2022-07-16 DIAGNOSIS — Z0000 Encounter for general adult medical examination without abnormal findings: Secondary | ICD-10-CM

## 2022-07-16 DIAGNOSIS — R7302 Impaired glucose tolerance (oral): Secondary | ICD-10-CM

## 2022-07-16 DIAGNOSIS — R413 Other amnesia: Secondary | ICD-10-CM

## 2022-07-16 DIAGNOSIS — Z1159 Encounter for screening for other viral diseases: Secondary | ICD-10-CM

## 2022-07-16 LAB — CBC WITH DIFFERENTIAL/PLATELET
Basophils Absolute: 0 10*3/uL (ref 0.0–0.1)
Basophils Relative: 0.7 % (ref 0.0–3.0)
Eosinophils Absolute: 0.1 10*3/uL (ref 0.0–0.7)
Eosinophils Relative: 2.2 % (ref 0.0–5.0)
HCT: 42 % (ref 36.0–46.0)
Hemoglobin: 14.1 g/dL (ref 12.0–15.0)
Lymphocytes Relative: 28.5 % (ref 12.0–46.0)
Lymphs Abs: 1.3 10*3/uL (ref 0.7–4.0)
MCHC: 33.5 g/dL (ref 30.0–36.0)
MCV: 88.1 fl (ref 78.0–100.0)
Monocytes Absolute: 0.4 10*3/uL (ref 0.1–1.0)
Monocytes Relative: 7.8 % (ref 3.0–12.0)
Neutro Abs: 2.7 10*3/uL (ref 1.4–7.7)
Neutrophils Relative %: 60.8 % (ref 43.0–77.0)
Platelets: 235 10*3/uL (ref 150.0–400.0)
RBC: 4.77 Mil/uL (ref 3.87–5.11)
RDW: 13.2 % (ref 11.5–15.5)
WBC: 4.5 10*3/uL (ref 4.0–10.5)

## 2022-07-16 LAB — COMPREHENSIVE METABOLIC PANEL
ALT: 35 U/L (ref 0–35)
AST: 19 U/L (ref 0–37)
Albumin: 4.3 g/dL (ref 3.5–5.2)
Alkaline Phosphatase: 62 U/L (ref 39–117)
BUN: 14 mg/dL (ref 6–23)
CO2: 31 mEq/L (ref 19–32)
Calcium: 9.4 mg/dL (ref 8.4–10.5)
Chloride: 98 mEq/L (ref 96–112)
Creatinine, Ser: 1.2 mg/dL (ref 0.40–1.20)
GFR: 48.76 mL/min — ABNORMAL LOW (ref >60.00)
Glucose, Bld: 108 mg/dL — ABNORMAL HIGH (ref 70–99)
Potassium: 4.2 mEq/L (ref 3.5–5.1)
Sodium: 137 mEq/L (ref 135–145)
Total Bilirubin: 0.4 mg/dL (ref 0.2–1.2)
Total Protein: 7.5 g/dL (ref 6.0–8.3)

## 2022-07-16 LAB — HEMOGLOBIN A1C: Hgb A1c MFr Bld: 5.8 % (ref 4.6–6.5)

## 2022-07-16 LAB — LIPID PANEL
Cholesterol: 189 mg/dL (ref 0–200)
HDL: 57 mg/dL (ref >39.00)
LDL Cholesterol: 111 mg/dL — ABNORMAL HIGH (ref 0–99)
NonHDL: 131.92
Total CHOL/HDL Ratio: 3
Triglycerides: 106 mg/dL (ref 0.0–149.0)
VLDL: 21.2 mg/dL (ref 0.0–40.0)

## 2022-07-16 LAB — VITAMIN B12: Vitamin B-12: 332 pg/mL (ref 211–911)

## 2022-07-16 LAB — TSH: TSH: 1.08 u[IU]/mL (ref 0.35–5.50)

## 2022-07-16 LAB — VITAMIN D 25 HYDROXY (VIT D DEFICIENCY, FRACTURES): VITD: 48.79 ng/mL (ref 30.00–100.00)

## 2022-07-16 MED ORDER — CYCLOBENZAPRINE HCL 10 MG PO TABS
10.0000 mg | ORAL_TABLET | Freq: Three times a day (TID) | ORAL | 1 refills | Status: AC | PRN
Start: 2022-07-16 — End: ?

## 2022-07-16 NOTE — Progress Notes (Signed)
Established Patient Office Visit     CC/Reason for Visit: Annual preventive exam  HPI: Mary Reyes is a 62 y.o. female who is coming in today for the above mentioned reasons. Past Medical History is significant for: Hypertension, impaired glucose tolerance, hypothyroidism, OSA on CPAP, GERD, depression, vitamin D deficiency.  Her sister was recently diagnosed with Lewy body dementia, she is younger.  She is now concerned with what she describes as "above average" memory loss.  She is overdue for an RSV vaccine, cancer screening is up-to-date.   Past Medical/Surgical History: Past Medical History:  Diagnosis Date   Allergy    Anxiety    Arthritis    Blood in stool    Colon polyp    Depression    Eczema    Frequent headaches    GERD (gastroesophageal reflux disease)    Hypertension    Hypothyroidism    Migraine    OSA (obstructive sleep apnea)    CPAP nightly   Osteopenia    Sleep apnea    Thyroid disease    Trimalleolar fracture of ankle, closed, left, sequela     Past Surgical History:  Procedure Laterality Date   ABDOMINAL HYSTERECTOMY  2004   ORIF ANKLE FRACTURE Left 02/12/2016   Procedure: OPEN REDUCTION INTERNAL FIXATION (ORIF) LEFT ANKLE FRACTURE;  Surgeon: Tarry Kos, MD;  Location: [facility];  Service: Orthopedics;  Laterality: Left;   WISDOM TOOTH EXTRACTION      Social History:  reports that she has never smoked. She has never used smokeless tobacco. She reports that she does not drink alcohol and does not use drugs.  Allergies: Allergies  Allergen Reactions   Adhesive [Tape] Rash   Benzoyl Peroxide Rash    Benzine    Milk-Related Compounds Rash    Family History:  Family History  Problem Relation Age of Onset   Healthy Mother    Alcohol abuse Father        Deceased   Brain cancer Sister    Asthma Sister    Stroke Maternal Grandmother    Arthritis Paternal Grandmother    Depression Daughter    Heart murmur  Daughter    Allergic rhinitis Daughter    Fibromyalgia Daughter    Allergic rhinitis Daughter    Migraines Son    Allergic rhinitis Son    Angioedema Neg Hx    Eczema Neg Hx    Immunodeficiency Neg Hx    Urticaria Neg Hx    Colon cancer Neg Hx    Esophageal cancer Neg Hx    Stomach cancer Neg Hx    Rectal cancer Neg Hx      Current Outpatient Medications:    ALPRAZolam (XANAX) 0.25 MG tablet, Take 0.25 mg by mouth at bedtime as needed for anxiety or sleep., Disp: , Rfl: 0   ARIPiprazole (ABILIFY) 2 MG tablet, Take 2 mg by mouth daily., Disp: , Rfl:    Azelastine HCl 0.15 % SOLN, Place into the nose., Disp: , Rfl:    b complex vitamins capsule, Take 1 capsule by mouth daily., Disp: , Rfl:    buPROPion (WELLBUTRIN XL) 300 MG 24 hr tablet, Take 300 mg by mouth every morning., Disp: , Rfl:    calcium-vitamin D (OSCAL WITH D) 500-200 MG-UNIT tablet, Take 1 tablet by mouth 3 (three) times daily., Disp: 90 tablet, Rfl: 12   cholecalciferol (VITAMIN D) 1000 units tablet, Take 2,000 Units by mouth at bedtime., Disp: ,  Rfl:    desvenlafaxine (PRISTIQ) 100 MG 24 hr tablet, Take 100 mg by mouth every morning., Disp: , Rfl:    famotidine-calcium carbonate-magnesium hydroxide (PEPCID COMPLETE) 10-800-165 MG chewable tablet, Chew 1 tablet by mouth daily as needed., Disp: , Rfl:    fexofenadine (ALLEGRA) 180 MG tablet, Take by mouth., Disp: , Rfl:    fluticasone (FLONASE) 50 MCG/ACT nasal spray, Place into both nostrils daily., Disp: , Rfl:    ibuprofen (ADVIL,MOTRIN) 200 MG tablet, Take 400 mg by mouth every 6 (six) hours as needed for headache, mild pain or moderate pain., Disp: , Rfl:    levothyroxine (SYNTHROID) 50 MCG tablet, TAKE 1 TABLET BY MOUTH EVERY MORNING MONDAY-SATURDAY AND TAKE 2 TABLETS BY MOUTH ON SUNDAY, Disp: 105 tablet, Rfl: 3   liothyronine (CYTOMEL) 5 MCG tablet, TAKE 1 AND 1/2 TABLET BY MOUTH ONCE DAILY, Disp: 135 tablet, Rfl: 3   lisinopril (ZESTRIL) 10 MG tablet, Take 1 tablet  (10 mg total) by mouth daily., Disp: 90 tablet, Rfl: 1   metoprolol tartrate (LOPRESSOR) 25 MG tablet, Take 1 tablet (25 mg total) by mouth 2 (two) times daily., Disp: 180 tablet, Rfl: 1   cyclobenzaprine (FLEXERIL) 10 MG tablet, Take 1 tablet (10 mg total) by mouth 3 (three) times daily as needed for muscle spasms., Disp: 90 tablet, Rfl: 1  Review of Systems:  Negative unless indicated in HPI.   Physical Exam: Vitals:   07/16/22 0957  BP: 120/78  Pulse: (!) 56  Temp: 98.3 F (36.8 C)  TempSrc: Oral  SpO2: 94%  Weight: 254 lb (115.2 kg)  Height:  (1.6 m)    Body mass index is 44.99 kg/m.   Physical Exam Vitals reviewed.  Constitutional:      General: She is not in acute distress.    Appearance: Normal appearance. She is not ill-appearing, toxic-appearing or diaphoretic.  HENT:     Head: Normocephalic.     Right Ear: Tympanic membrane, ear canal and external ear normal. There is no impacted cerumen.     Left Ear: Tympanic membrane, ear canal and external ear normal. There is no impacted cerumen.     Nose: Nose normal.     Mouth/Throat:     Mouth: Mucous membranes are moist.     Pharynx: Oropharynx is clear. No oropharyngeal exudate or posterior oropharyngeal erythema.  Eyes:     General: No scleral icterus.       Right eye: No discharge.        Left eye: No discharge.     Conjunctiva/sclera: Conjunctivae normal.     Pupils: Pupils are equal, round, and reactive to light.  Neck:     Vascular: No carotid bruit.  Cardiovascular:     Rate and Rhythm: Normal rate and regular rhythm.     Pulses: Normal pulses.     Heart sounds: Normal heart sounds.  Pulmonary:     Effort: Pulmonary effort is normal. No respiratory distress.     Breath sounds: Normal breath sounds.  Abdominal:     General: Abdomen is flat. Bowel sounds are normal.     Palpations: Abdomen is soft.  Musculoskeletal:        General: Normal range of motion.     Cervical back: Normal range of motion.   Skin:    General: Skin is warm and dry.  Neurological:     General: No focal deficit present.     Mental Status: She is alert and oriented to person,  place, and time. Mental status is at baseline.  Psychiatric:        Mood and Affect: Mood normal.        Behavior: Behavior normal.        Thought Content: Thought content normal.        Judgment: Judgment normal.     Flowsheet Row Office Visit from 07/16/2022 in [facility] [place]  PHQ-9 Total Score 3        Impression and Plan:  Encounter for preventive health examination  Vitamin D deficiency - Plan: Vitamin D, 25-hydroxy  Acquired hypothyroidism - Plan: TSH  Essential hypertension - Plan: CBC with Differential/Platelet, Comprehensive metabolic panel, Lipid panel  IGT (impaired glucose tolerance) - Plan: Hemoglobin A1c  Morbid obesity - Plan: cyclobenzaprine (FLEXERIL) 10 MG tablet  Encounter for hepatitis C screening test for low risk patient  Encounter for screening for HIV  Memory loss - Plan: Vitamin B12  -Recommend routine eye and dental care. -Healthy lifestyle discussed in detail. -Labs to be updated today. -Prostate cancer screening: N/A Health Maintenance  Topic Date Due   HIV Screening  Never done   Hepatitis C Screening: USPSTF Recommendation to screen - Ages 29-79 yo.  Never done   COVID-19 Vaccine (4 - 2023-24 season) 01/15/2023*   Flu Shot  10/29/2022   Mammogram  09/12/2023   Colon Cancer Screening  11/28/2026   DTaP/Tdap/Td vaccine (2 - Td or Tdap) 05/06/2028   Zoster (Shingles) Vaccine  Completed   HPV Vaccine  Aged Out  *Topic was postponed. The date shown is not the original due date.    -Advised to update RSV via [employer]. -She had a total hysterectomy and as such no longer needs Pap smears. -She is concerned about her memory loss with her sister being diagnosed with Lewy body dementia.  Check TSH and B12 today, schedule Mini-Mental for next  check.       Chaya Jan, MD [facility] at [facility]

## 2022-07-30 ENCOUNTER — Encounter: Payer: Self-pay | Admitting: Nurse Practitioner

## 2022-07-30 ENCOUNTER — Ambulatory Visit (INDEPENDENT_AMBULATORY_CARE_PROVIDER_SITE_OTHER): Payer: BC Managed Care – PPO | Admitting: Nurse Practitioner

## 2022-07-30 VITALS — BP 140/78 | HR 56 | Temp 98.4°F | Ht 62.0 in | Wt 256.6 lb

## 2022-07-30 DIAGNOSIS — G4733 Obstructive sleep apnea (adult) (pediatric): Secondary | ICD-10-CM

## 2022-07-30 NOTE — Patient Instructions (Addendum)
Continue to use CPAP every night, minimum of 4-6 hours a night.  Change equipment every 30 days or as directed by DME. Wash your tubing with warm soap and water daily, hang to dry. Wash humidifier portion weekly.  Be aware of reduced alertness and do not drive or operate heavy machinery if experiencing this or drowsiness.  Exercise encouraged, as tolerated. Notify if persistent daytime sleepiness occurs even with consistent use of CPAP.  Orders placed for new machine today   Follow up with Dr. Craige Cotta or Katie Levana Minetti,NP in 12 weeks, or sooner, if needed

## 2022-07-30 NOTE — Progress Notes (Signed)
Reviewed and agree with assessment/plan.   Leeza Heiner, MD [facility] 07/30/2022, 12:24 PM Pager:  [phone]  

## 2022-07-30 NOTE — Assessment & Plan Note (Signed)
Excellent control and compliance with CPAP use. Receives benefit from use. No significant leaks. Orders placed for new CPAP 14-20 cmH2O, mask of choice and heated humidification. Understands proper use/care of device. Aware of safe driving practices.   Patient Instructions  Continue to use CPAP every night, minimum of 4-6 hours a night.  Change equipment every 30 days or as directed by DME. Wash your tubing with warm soap and water daily, hang to dry. Wash humidifier portion weekly.  Be aware of reduced alertness and do not drive or operate heavy machinery if experiencing this or drowsiness.  Exercise encouraged, as tolerated. Notify if persistent daytime sleepiness occurs even with consistent use of CPAP.  Orders placed for new machine today   Follow up with Dr. Craige Cotta or Katie Jeannette Maddy,NP in 12 weeks, or sooner, if needed

## 2022-07-30 NOTE — Progress Notes (Signed)
@Patient  ID: Mary Reyes, female    DOB: 1960-11-21, 62 y.o.   MRN: [id number]  Chief Complaint  Patient presents with   Follow-up    Pt states she is doing fine on cpap. Does need order for new cpap machine     Referring provider: Philip Aspen, [place]*  HPI: 62 year old female, never smoker previously followed for OSA and referred for sleep consult. Past medical history significant for HTN, allergic rhinitis, GERD, hypothyroid, depression.   TEST/EVENTS:  2003 PSG: AHI 57  07/30/2022: Today - sleep consult Patient presents today for sleep consult and to re-establish care for management of her OSA. She has been on CPAP for many years. Wears it nightly. Doesn't think she could sleep without it. She wakes feeling rested. No significant daytime fatigue. Denies any morning headaches or drowsy driving. Her machine gave her a motor life warning. It is well over 62 years old. [employer] told her she needed a new appointment to get a new machine. Wears a full face mask. No trouble with leaks. Machine is set at 14-20 cmH2O.  She goes to bed between 9-8 pm. Falls asleep quickly. Wakes 2-3 times to use the bathroom. Gets up around 8 am. She does sleep long periods, usually about 10 hours. She takes Abilify and occasionally a xanax before bed; no other sleep aids. She is not employed. Last sleep study was sometime around 2003. She has a history of hypertension, well controlled on antihypertensives. No hx of stroke, DM, or cardiac arrhythmia.  She is a never smoker. Doesn't drink alcohol. No excessive caffeine intake. Lives with her husband. Family history of allergies, asthma, Reyes.  [facility] 7  05/01/2022-07/29/2022: CPAP download 89/90 days; 100% >4 hr; average use 11 hr Leaks 95th 5.2  AHI 3.38/h  Allergies  Allergen Reactions   Adhesive [Tape] Rash   Benzoyl Peroxide Rash    Benzine    Milk-Related Compounds Rash    Immunization History  Administered Date(s) Administered    Influenza,inj,Quad PF,6+ Mos 03/30/2014, 01/16/2015, 02/14/2019, 01/10/2020, 12/11/2020, 01/14/2022   PFIZER(Purple Top)SARS-COV-2 Vaccination 06/03/2019, 07/04/2019, 12/29/2019   Tdap 05/06/2018   Zoster Recombinat (Shingrix) 07/09/2020, 10/15/2020    Past Medical History:  Diagnosis Date   Allergy    Anxiety    Arthritis    Blood in stool    Colon polyp    Depression    Eczema    Frequent headaches    GERD (gastroesophageal reflux disease)    Hypertension    Hypothyroidism    Migraine    OSA (obstructive sleep apnea)    CPAP nightly   Osteopenia    Sleep apnea    Thyroid disease    Trimalleolar fracture of ankle, closed, left, sequela     Tobacco History: Social History   Tobacco Use  Smoking Status Never  Smokeless Tobacco Never   Counseling given: Not Answered   Outpatient Medications Prior to Visit  Medication Sig Dispense Refill   ALPRAZolam (XANAX) 0.25 MG tablet Take 0.25 mg by mouth at bedtime as needed for anxiety or sleep.  0   ARIPiprazole (ABILIFY) 2 MG tablet Take 2 mg by mouth daily.     Azelastine HCl 0.15 % SOLN Place into the nose.     b complex vitamins capsule Take 1 capsule by mouth daily.     buPROPion (WELLBUTRIN XL) 300 MG 24 hr tablet Take 300 mg by mouth every morning.     calcium-vitamin D (OSCAL WITH D) 500-200 MG-UNIT  tablet Take 1 tablet by mouth 3 (three) times daily. 90 tablet 12   cholecalciferol (VITAMIN D) 1000 units tablet Take 2,000 Units by mouth at bedtime.     cyclobenzaprine (FLEXERIL) 10 MG tablet Take 1 tablet (10 mg total) by mouth 3 (three) times daily as needed for muscle spasms. 90 tablet 1   desvenlafaxine (PRISTIQ) 100 MG 24 hr tablet Take 100 mg by mouth every morning.     famotidine-calcium carbonate-magnesium hydroxide (PEPCID COMPLETE) 10-800-165 MG chewable tablet Chew 1 tablet by mouth daily as needed.     fexofenadine (ALLEGRA) 180 MG tablet Take by mouth.     fluticasone (FLONASE) 50 MCG/ACT nasal spray Place  into both nostrils daily.     ibuprofen (ADVIL,MOTRIN) 200 MG tablet Take 400 mg by mouth every 6 (six) hours as needed for headache, mild pain or moderate pain.     levothyroxine (SYNTHROID) 50 MCG tablet TAKE 1 TABLET BY MOUTH EVERY MORNING MONDAY-SATURDAY AND TAKE 2 TABLETS BY MOUTH ON SUNDAY 105 tablet 3   liothyronine (CYTOMEL) 5 MCG tablet TAKE 1 AND 1/2 TABLET BY MOUTH ONCE DAILY 135 tablet 3   lisinopril (ZESTRIL) 10 MG tablet Take 1 tablet (10 mg total) by mouth daily. 90 tablet 1   metoprolol tartrate (LOPRESSOR) 25 MG tablet Take 1 tablet (25 mg total) by mouth 2 (two) times daily. 180 tablet 1   No facility-administered medications prior to visit.     Review of Systems:   Constitutional: No weight loss or gain, night sweats, fevers, chills, fatigue, or lassitude. HEENT: No headaches, difficulty swallowing, tooth/dental problems, or sore throat. No sneezing, itching, ear ache, nasal congestion, or post nasal drip CV:  No chest pain, orthopnea, PND, swelling in lower extremities, anasarca, dizziness, palpitations, syncope Resp: No shortness of breath with exertion or at rest. No excess mucus or change in color of mucus. No productive or non-productive. No hemoptysis. No wheezing.  No chest wall deformity GI:  No heartburn, indigestion, abdominal pain, nausea, vomiting, diarrhea, change in bowel habits, loss of appetite, bloody stools.  GU: No dysuria, change in color of urine, urgency or frequency.   Skin: No rash, lesions, ulcerations MSK:  No joint pain or swelling.   Neuro: No dizziness or lightheadedness.  Psych: No SI/HI. Mood stable.     Physical Exam:  BP (!) 140/78   Pulse (!) 56   Temp 98.4 F (36.9 C) (Oral)   Ht 5\' 2"  (1.575 m)   Wt 256 lb 9.6 oz (116.4 kg)   SpO2 96%   BMI 46.93 kg/m   GEN: Pleasant, interactive, well-appearing; morbidly obese; in no acute distress. HEENT:  Normocephalic and atraumatic. PERRLA. Sclera white. Nasal turbinates pink, moist  and patent bilaterally. No rhinorrhea present. Oropharynx pink and moist, without exudate or edema. No lesions, ulcerations, or postnasal drip. Mallampati IV NECK:  Supple w/ fair ROM. No JVD present. Normal carotid impulses w/o bruits. Thyroid symmetrical with no goiter or nodules palpated. No lymphadenopathy.   CV: RRR, no m/r/g, no peripheral edema. Pulses intact, +2 bilaterally. No cyanosis, pallor or clubbing. PULMONARY:  Unlabored, regular breathing. Clear bilaterally A&P w/o wheezes/rales/rhonchi. No accessory muscle use.  GI: BS present and normoactive. Soft, non-tender to palpation. No organomegaly or masses detected.  MSK: No erythema, warmth or tenderness. Cap refil <2 sec all extrem. No deformities or joint swelling noted.  Neuro: A/Ox3. No focal deficits noted.   Skin: Warm, no lesions or rashe Psych: Normal affect and behavior. Judgement  and thought content appropriate.     Lab Results:  CBC    Component Value Date/Time   WBC 4.5 07/16/2022 1028   RBC 4.77 07/16/2022 1028   HGB 14.1 07/16/2022 1028   HCT 42.0 07/16/2022 1028   PLT 235.0 07/16/2022 1028   MCV 88.1 07/16/2022 1028   MCH 29.4 09/17/2020 1424   MCHC 33.5 07/16/2022 1028   RDW 13.2 07/16/2022 1028   LYMPHSABS 1.3 07/16/2022 1028   MONOABS 0.4 07/16/2022 1028   EOSABS 0.1 07/16/2022 1028   BASOSABS 0.0 07/16/2022 1028    BMET    Component Value Date/Time   NA 137 07/16/2022 1028   K 4.2 07/16/2022 1028   CL 98 07/16/2022 1028   CO2 31 07/16/2022 1028   GLUCOSE 108 (H) 07/16/2022 1028   BUN 14 07/16/2022 1028   CREATININE 1.20 07/16/2022 1028   CREATININE 0.92 01/10/2020 1413   CALCIUM 9.4 07/16/2022 1028   GFRNONAA >60 09/17/2020 1424   GFRAA >60 02/11/2016 1400    BNP No results found for: "BNP"   Imaging:  No results found.        No data to display          No results found for: "NITRICOXIDE"      Assessment & Plan:   OSA (obstructive sleep apnea) Excellent control  and compliance with CPAP use. Receives benefit from use. No significant leaks. Orders placed for new CPAP 14-20 cmH2O, mask of choice and heated humidification. Understands proper use/care of device. Aware of safe driving practices.   Patient Instructions  Continue to use CPAP every night, minimum of 4-6 hours a night.  Change equipment every 30 days or as directed by DME. Wash your tubing with warm soap and water daily, hang to dry. Wash humidifier portion weekly.  Be aware of reduced alertness and do not drive or operate heavy machinery if experiencing this or drowsiness.  Exercise encouraged, as tolerated. Notify if persistent daytime sleepiness occurs even with consistent use of CPAP.  Orders placed for new machine today   Follow up with Dr. Craige Cotta or Florentina Addison Kalei Mckillop,NP in 12 weeks, or sooner, if needed    I spent 35 minutes of dedicated to the care of this patient on the date of this encounter to include pre-visit review of records, face-to-face time with the patient discussing conditions above, post visit ordering of testing, clinical documentation with the electronic health record, making appropriate referrals as documented, and communicating necessary findings to members of the patients care team.  Noemi Chapel, NP 07/30/2022  Pt aware and understands NP's role.

## 2022-08-05 DIAGNOSIS — G4733 Obstructive sleep apnea (adult) (pediatric): Secondary | ICD-10-CM | POA: Diagnosis not present

## 2022-09-05 DIAGNOSIS — G4733 Obstructive sleep apnea (adult) (pediatric): Secondary | ICD-10-CM | POA: Diagnosis not present

## 2022-09-07 DIAGNOSIS — G4733 Obstructive sleep apnea (adult) (pediatric): Secondary | ICD-10-CM | POA: Diagnosis not present

## 2022-09-29 DIAGNOSIS — F419 Anxiety disorder, unspecified: Secondary | ICD-10-CM | POA: Diagnosis not present

## 2022-09-29 DIAGNOSIS — R419 Unspecified symptoms and signs involving cognitive functions and awareness: Secondary | ICD-10-CM | POA: Diagnosis not present

## 2022-09-29 DIAGNOSIS — F3341 Major depressive disorder, recurrent, in partial remission: Secondary | ICD-10-CM | POA: Diagnosis not present

## 2022-10-05 DIAGNOSIS — G4733 Obstructive sleep apnea (adult) (pediatric): Secondary | ICD-10-CM | POA: Diagnosis not present

## 2022-10-15 ENCOUNTER — Ambulatory Visit: Payer: BC Managed Care – PPO | Admitting: Internal Medicine

## 2022-10-20 ENCOUNTER — Ambulatory Visit (INDEPENDENT_AMBULATORY_CARE_PROVIDER_SITE_OTHER): Payer: BC Managed Care – PPO | Admitting: Internal Medicine

## 2022-10-20 ENCOUNTER — Encounter: Payer: Self-pay | Admitting: Internal Medicine

## 2022-10-20 VITALS — BP 138/80 | HR 61 | Ht 62.0 in | Wt 261.6 lb

## 2022-10-20 DIAGNOSIS — M8580 Other specified disorders of bone density and structure, unspecified site: Secondary | ICD-10-CM

## 2022-10-20 DIAGNOSIS — E039 Hypothyroidism, unspecified: Secondary | ICD-10-CM | POA: Diagnosis not present

## 2022-10-20 DIAGNOSIS — Z1589 Genetic susceptibility to other disease: Secondary | ICD-10-CM

## 2022-10-20 DIAGNOSIS — E559 Vitamin D deficiency, unspecified: Secondary | ICD-10-CM

## 2022-10-20 DIAGNOSIS — R7303 Prediabetes: Secondary | ICD-10-CM

## 2022-10-20 NOTE — Patient Instructions (Addendum)
Please take methylated vitamin B complex daily.  Please continue Levothyroxine 50 mcg on 6/7 days, and 100 mcg 1/7 days + Cytomel 7.5 mcg.  Take the thyroid hormone every day, with water, at least 30 minutes before breakfast, separated by at least 4 hours from: - acid reflux medications - calcium - iron - multivitamins  Please return in 1 year.

## 2022-10-20 NOTE — Progress Notes (Signed)
Patient ID: Mary Reyes, female   DOB: [birth year], 62 y.o.   MRN: [id number]  HPI  Mary Reyes is a 62 y.o.-year-old female, returning for f/u for hypothyroidism, vitamin D deficiency and osteopenia. Last visit 1 year ago.  Interim history: She continues to have fatigue. She is not exercising.  She does have muscle tightness in her thighs especially when she exerts herself. She is worried about memory loss - will have memory tests coming up with PCP in 2 days.  Hypothyroidism: Reviewed history: She tells me that her thyroid problem started when she found a "lump in her neck" in 2001. She saw ENT at that time, who was following the nodule, which kept growing. She remembers that she was then dx with Mononucleosis. She had a FNA of the nodule and this apparently returned positive for lymphoid tissue. It was considered that this was an enlarged lymph node possibly from mononucleosis. She did not have a thyroid U/S after this episode. She remembers that the nodule did decrease in size after prednisone.  Patient was found to be hyperthyroid during the investigation for her enlarged lymph node. She initially tried oral anti-thyroid medicines, which were not effective. She had RAI tx in 2002. She did not have the start LT4 after her RAI Tx, but became hypothyroid and had to start levothyroxine in 2008. She is not sure why she was recommended to add LT3, but she believes that it was added because of her history of depression.   She is on levothyroxine 50 mcg 6 out of 7 days and 100 mcg 1 out of 7 days + liothyronine 7.5 mcg daily: - in am - fasting - at least 30 min from b'fast - + Ca, PPIs in the evening - no Fe, MVIs - not on Biotin (methyl- folate in the evening)  Reviewed her TFTs: Lab Results  Component Value Date   TSH 1.08 07/16/2022   TSH 1.34 10/16/2021   TSH 1.50 07/15/2021   TSH 0.87 07/09/2020   TSH 1.53 01/10/2020   FREET4 0.65 10/16/2021   FREET4 0.64 10/20/2018    FREET4 0.66 10/26/2017   FREET4 0.72 09/08/2016   FREET4 0.84 02/03/2016  04/03/2014: TSH 0.92, fT4 0.97, fT3 2.8  Pt denies: - feeling nodules in neck - hoarseness - dysphagia - choking  She has + FH of thyroid disorders in: thyroiditis. No FH of thyroid Reyes. + h/o radiation tx to head or neck. No herbal supplements. No recent steroids use.   Vitamin D deficiency: Previously on ergocalciferol, but then changed to 2000 units vitamin D3 daily.  Reviewed vitamin D levels: Lab Results  Component Value Date   VD25OH 48.79 07/16/2022   VD25OH 42.01 07/15/2021   VD25OH 41.96 07/09/2020   VD25OH 37 01/10/2020   VD25OH 44.77 07/06/2019   VD25OH 47.21 10/20/2018   VD25OH 43.35 10/26/2017   VD25OH 43.80 11/03/2016   VD25OH 42.23 02/03/2016   VD25OH 45.34 07/20/2014   Osteopenia:  Reviewed the available DXA scan reports: 11/13/2021 ([facility])  Lumbar spine L1-L4 Femoral neck (FN)  T-score -1.4 RFN: -1.2 LFN: -1.5  Change in BMD from previous DXA test (%) Up 1.2% Up 4.3%*  (*) statistically significant  10/29/2019 ([facility]) Lumbar spine L1-L4 Femoral neck (FN)  T-score -1.5 RFN: -1.7 LFN: -1.5  Change in BMD from previous DXA test (%) Up 3.5% Down 9.1%  (*) statistically significant  - DXA scan 11/10/2016: L1-L4: T -1.7 RFN: T -1.1 LFN: T -1.0  FRAX MOF  risk: 9.4%, Hip fx risk 0.5% - DXA scan 09/05/2014: L1-L4: T -1.6 RFN: T -1.3 LFN: T -1.0 FRAX MOF risk: 5.1%, Hip fx risk 0.3% -She had a left ankle fracture in 01/2016.  She had to have surgery for this.  No fractures since then. -No previous antiresorptive therapy.  She had dysesthesia and I checked her for MTHFR mutation in the past.  This returned positive for: Component     Latest Ref Rng & Units 12/11/2015  MTHFR      C677T Single mutation (C677T) identified  Vitamin B12     211 - 911 pg/mL 447  Methylmalonic Acid, Quant     87 - 318 nmol/L 111  Vitamin B12 and MMA normal.  Pt is heterozygous  for the C667T MTHFR mutation >> this is associated with an approximately 30% decrease in methylation activity.  After the results returned, I called and discussed with the geneticist at [facility], recommended to check a homocystine. If this is high, this could be treated, but otherwise, no intervention is necessary.  Homocysteine was high, so we discussed about taking her methylated vitamin B consistently.    Latest homocystine level finally normalized: Component     Latest Ref Rng & Units 11/28/2019  Homocysteine     <10.4 umol/L 7.7   Component     Latest Ref Rng & Units 11/03/2016 10/26/2017 10/20/2018  Homocysteine     <10.4 umol/L 10.7 (H) 11.4 (H) 11.6 (H)   Previous endocrinologist Dr. Doneta Public [phone] - in [place]. She also has a history of ?MS, GERD, HTN, OSA, migraines. She also has prediabetes: Lab Results  Component Value Date   HGBA1C 5.8 07/16/2022   HGBA1C 5.9 01/08/2022   HGBA1C 5.9 10/16/2021   HGBA1C 6.0 07/15/2021   HGBA1C 5.8 07/09/2020   HGBA1C 5.5 07/06/2019   Father had Lieber's ds. (adult onset blindness in men) Her younger sister has Lewy body dementia - on hospice -in [place].   ROS: + See HPI  Past Medical History:  Diagnosis Date   Allergy    Anxiety    Arthritis    Blood in stool    Colon polyp    Depression    Eczema    Frequent headaches    GERD (gastroesophageal reflux disease)    Hypertension    Hypothyroidism    Migraine    OSA (obstructive sleep apnea)    CPAP nightly   Osteopenia    Sleep apnea    Thyroid disease    Trimalleolar fracture of ankle, closed, left, sequela    Past Surgical History:  Procedure Laterality Date   ABDOMINAL HYSTERECTOMY  2004   ORIF ANKLE FRACTURE Left 02/12/2016   Procedure: OPEN REDUCTION INTERNAL FIXATION (ORIF) LEFT ANKLE FRACTURE;  Surgeon: Tarry Kos, MD;  Location: [facility];  Service: Orthopedics;  Laterality: Left;   WISDOM TOOTH EXTRACTION     History   Social  History   Marital Status: Married    Spouse Name: N/A   Number of Children:  3    Occupational History   N/a   Social History Main Topics   Smoking status: Never Smoker    Smokeless tobacco: Not on file   Alcohol Use: No   Drug Use: No   Current Outpatient Medications on File Prior to Visit  Medication Sig Dispense Refill   ALPRAZolam (XANAX) 0.25 MG tablet Take 0.25 mg by mouth at bedtime as needed for anxiety or sleep.  0  ARIPiprazole (ABILIFY) 2 MG tablet Take 2 mg by mouth daily.     Azelastine HCl 0.15 % SOLN Place into the nose.     b complex vitamins capsule Take 1 capsule by mouth daily.     buPROPion (WELLBUTRIN XL) 300 MG 24 hr tablet Take 300 mg by mouth every morning.     calcium-vitamin D (OSCAL WITH D) 500-200 MG-UNIT tablet Take 1 tablet by mouth 3 (three) times daily. 90 tablet 12   cholecalciferol (VITAMIN D) 1000 units tablet Take 2,000 Units by mouth at bedtime.     cyclobenzaprine (FLEXERIL) 10 MG tablet Take 1 tablet (10 mg total) by mouth 3 (three) times daily as needed for muscle spasms. 90 tablet 1   desvenlafaxine (PRISTIQ) 100 MG 24 hr tablet Take 100 mg by mouth every morning.     famotidine-calcium carbonate-magnesium hydroxide (PEPCID COMPLETE) 10-800-165 MG chewable tablet Chew 1 tablet by mouth daily as needed.     fexofenadine (ALLEGRA) 180 MG tablet Take by mouth.     fluticasone (FLONASE) 50 MCG/ACT nasal spray Place into both nostrils daily.     ibuprofen (ADVIL,MOTRIN) 200 MG tablet Take 400 mg by mouth every 6 (six) hours as needed for headache, mild pain or moderate pain.     levothyroxine (SYNTHROID) 50 MCG tablet TAKE 1 TABLET BY MOUTH EVERY MORNING MONDAY-SATURDAY AND TAKE 2 TABLETS BY MOUTH ON SUNDAY 105 tablet 3   liothyronine (CYTOMEL) 5 MCG tablet TAKE 1 AND 1/2 TABLET BY MOUTH ONCE DAILY 135 tablet 3   lisinopril (ZESTRIL) 10 MG tablet Take 1 tablet (10 mg total) by mouth daily. 90 tablet 1   metoprolol tartrate (LOPRESSOR) 25 MG tablet  Take 1 tablet (25 mg total) by mouth 2 (two) times daily. 180 tablet 1   No current facility-administered medications on file prior to visit.   Allergies  Allergen Reactions   Adhesive [Tape] Rash   Benzoyl Peroxide Rash    Benzine    Milk-Related Compounds Rash   Family History  Problem Relation Age of Onset   Healthy Mother    Alcohol abuse Father        Deceased   Brain Reyes Sister    Asthma Sister    Stroke Maternal Grandmother    Arthritis Paternal Grandmother    Depression Daughter    Heart murmur Daughter    Allergic rhinitis Daughter    Fibromyalgia Daughter    Allergic rhinitis Daughter    Migraines Son    Allergic rhinitis Son    Angioedema Neg Hx    Eczema Neg Hx    Immunodeficiency Neg Hx    Urticaria Neg Hx    Colon Reyes Neg Hx    Esophageal Reyes Neg Hx    Stomach Reyes Neg Hx    Rectal Reyes Neg Hx   + see HPI Also: Sisters with hypertension Child with PDA 2 daughters with history of thyroiditis Sister with brain tumor  PE: BP 138/80   Pulse 61   Ht 5\' 2"  (1.575 m)   Wt 261 lb 9.6 oz (118.7 kg)   SpO2 97%   BMI 47.85 kg/m   Wt Readings from Last 3 Encounters:  10/20/22 261 lb 9.6 oz (118.7 kg)  07/30/22 256 lb 9.6 oz (116.4 kg)  07/16/22 254 lb (115.2 kg)   Constitutional: overweight, in NAD Eyes:  EOMI, no exophthalmos ENT: no neck masses, no cervical lymphadenopathy Cardiovascular: RRR, No MRG Respiratory: CTA B Musculoskeletal: no deformities Skin:no rashes Neurological:  no tremor with outstretched hands  ASSESSMENT: 1. Hypothyroidism  2. Vitamin D deficiency  3. Osteopenia  4. MTHFR heterozygosity - C677T normal  5.  Elevated HbA1c  PLAN:  1. Hypothyroidism -She has longstanding hypothyroidism on a complex regimen comprised on both levothyroxine and liothyronine - latest thyroid labs reviewed with pt. >> normal: Lab Results  Component Value Date   TSH 1.08 07/16/2022  - she continues on LT4 50 mcg 6/7 days,  100 mcg 1/7 days + LT 3 7.5 mcg daily - we previously discussed about simplifying her regimen but she got used to it and wanted to continue it - pt feels good on this dose except for fatigue, which is chronic for her - we discussed about taking the thyroid hormone every day, with water, >30 minutes before breakfast, separated by >4 hours from acid reflux medications, calcium, iron, multivitamins. Pt. is taking it correctly. - I will see her back in 1 year  2. Vitamin D deficiency -She is on 2000 units vitamin D daily -Latest vitamin D level was normal 06/2022: 48.79 -We will not repeat this today  3. Osteopenia -She has a history of ankle fracture in 2017 but no fractures since then -Reviewed the latest bone density scan reports from 10/29/2019: Worsening of the T-scores at the level of the femoral necks, but improved bone density at the spine -Since last visit, she had another bone density scan (11/13/2021) and we reviewed the results together: There was a 4.3% increase in bone density at the femoral necks, otherwise stable.  The FRAX score was normal. -I again advised her to do weightbearing exercises.  Otherwise, I do not feel she absolutely needs antiresorptive medication for now for now. -Of note, a vitamin D level was recently normal: Lab Results  Component Value Date   VD25OH 48.79 07/16/2022   4. MTHFR heterozygosity -In the past, homocystine levels were high and we started methylated vitamin B complex.  She was not taking them consistently in the past but we discussed about the importance of taking them daily.  We discussed that a B12 level is not directly reflecting the methylated B vitamins concentrations. -Latest B12 level was normal recently: Lab Results  Component Value Date   VITAMINB12 332 07/16/2022  -Latest homocysteine level was 3 years ago, normal, at 7.7 -At today's visit, she is taking methylfolate but I advised her to try to take a methylated B complex, if she can find  this  5.  Prediabetes -Patient had several HbA1c levels in the prediabetic range. -At last visit, HbA1c was slightly lower, at 5.9% and she had another HbA1c obtained 3 months ago which was slightly lower, at 5.8% -We discussed about the fact that prediabetes is a reversible condition with improving diet and starting a consistent exercise regimen  Carlus Pavlov, MD PhD [facility]

## 2022-10-22 ENCOUNTER — Ambulatory Visit: Payer: BC Managed Care – PPO | Admitting: Internal Medicine

## 2022-10-22 ENCOUNTER — Encounter: Payer: Self-pay | Admitting: Internal Medicine

## 2022-10-22 VITALS — BP 110/80 | HR 53 | Temp 98.2°F | Wt 261.6 lb

## 2022-10-22 DIAGNOSIS — R7302 Impaired glucose tolerance (oral): Secondary | ICD-10-CM | POA: Diagnosis not present

## 2022-10-22 DIAGNOSIS — E039 Hypothyroidism, unspecified: Secondary | ICD-10-CM | POA: Diagnosis not present

## 2022-10-22 DIAGNOSIS — I10 Essential (primary) hypertension: Secondary | ICD-10-CM

## 2022-10-22 DIAGNOSIS — R413 Other amnesia: Secondary | ICD-10-CM

## 2022-10-22 LAB — POCT GLYCOSYLATED HEMOGLOBIN (HGB A1C): Hemoglobin A1C: 5.8 % — AB (ref 4.0–5.6)

## 2022-10-22 NOTE — Assessment & Plan Note (Signed)
Well-controlled on current medications 

## 2022-10-22 NOTE — Progress Notes (Signed)
Established Patient Office Visit     CC/Reason for Visit: Follow-up chronic conditions  HPI: Mary Reyes is a 62 y.o. female who is coming in today for the above mentioned reasons. Past Medical History is significant for: Hypertension, impaired glucose tolerance, hypothyroidism, OSA, GERD, depression, vitamin D deficiency.  She is scheduled to have a Mini-Mental status exam today due to perceived concerns about memory loss.  She is otherwise feeling well.   Past Medical/Surgical History: Past Medical History:  Diagnosis Date   Allergy    Anxiety    Arthritis    Blood in stool    Colon polyp    Depression    Eczema    Frequent headaches    GERD (gastroesophageal reflux disease)    Hypertension    Hypothyroidism    Migraine    OSA (obstructive sleep apnea)    CPAP nightly   Osteopenia    Sleep apnea    Thyroid disease    Trimalleolar fracture of ankle, closed, left, sequela     Past Surgical History:  Procedure Laterality Date   ABDOMINAL HYSTERECTOMY  2004   ORIF ANKLE FRACTURE Left 02/12/2016   Procedure: OPEN REDUCTION INTERNAL FIXATION (ORIF) LEFT ANKLE FRACTURE;  Surgeon: Tarry Kos, MD;  Location: [facility];  Service: Orthopedics;  Laterality: Left;   WISDOM TOOTH EXTRACTION      Social History:  reports that she has never smoked. She has never used smokeless tobacco. She reports that she does not drink alcohol and does not use drugs.  Allergies: Allergies  Allergen Reactions   Adhesive [Tape] Rash   Benzoyl Peroxide Rash    Benzine    Milk-Related Compounds Rash    Family History:  Family History  Problem Relation Age of Onset   Healthy Mother    Alcohol abuse Father        Deceased   Brain cancer Sister    Asthma Sister    Stroke Maternal Grandmother    Arthritis Paternal Grandmother    Depression Daughter    Heart murmur Daughter    Allergic rhinitis Daughter    Fibromyalgia Daughter    Allergic rhinitis  Daughter    Migraines Son    Allergic rhinitis Son    Angioedema Neg Hx    Eczema Neg Hx    Immunodeficiency Neg Hx    Urticaria Neg Hx    Colon cancer Neg Hx    Esophageal cancer Neg Hx    Stomach cancer Neg Hx    Rectal cancer Neg Hx      Current Outpatient Medications:    ALPRAZolam (XANAX) 0.25 MG tablet, Take 0.25 mg by mouth at bedtime as needed for anxiety or sleep., Disp: , Rfl: 0   ARIPiprazole (ABILIFY) 2 MG tablet, Take 2 mg by mouth daily., Disp: , Rfl:    Azelastine HCl 0.15 % SOLN, Place into the nose., Disp: , Rfl:    b complex vitamins capsule, Take 1 capsule by mouth daily., Disp: , Rfl:    buPROPion (WELLBUTRIN XL) 300 MG 24 hr tablet, Take 300 mg by mouth every morning., Disp: , Rfl:    calcium-vitamin D (OSCAL WITH D) 500-200 MG-UNIT tablet, Take 1 tablet by mouth 3 (three) times daily., Disp: 90 tablet, Rfl: 12   cholecalciferol (VITAMIN D) 1000 units tablet, Take 2,000 Units by mouth at bedtime., Disp: , Rfl:    cyclobenzaprine (FLEXERIL) 10 MG tablet, Take 1 tablet (10 mg total) by  mouth 3 (three) times daily as needed for muscle spasms., Disp: 90 tablet, Rfl: 1   desvenlafaxine (PRISTIQ) 100 MG 24 hr tablet, Take 100 mg by mouth every morning., Disp: , Rfl:    famotidine-calcium carbonate-magnesium hydroxide (PEPCID COMPLETE) 10-800-165 MG chewable tablet, Chew 1 tablet by mouth daily as needed., Disp: , Rfl:    fexofenadine (ALLEGRA) 180 MG tablet, Take by mouth., Disp: , Rfl:    fluticasone (FLONASE) 50 MCG/ACT nasal spray, Place into both nostrils daily., Disp: , Rfl:    ibuprofen (ADVIL,MOTRIN) 200 MG tablet, Take 400 mg by mouth every 6 (six) hours as needed for headache, mild pain or moderate pain., Disp: , Rfl:    levothyroxine (SYNTHROID) 50 MCG tablet, TAKE 1 TABLET BY MOUTH EVERY MORNING MONDAY-SATURDAY AND TAKE 2 TABLETS BY MOUTH ON SUNDAY, Disp: 105 tablet, Rfl: 3   liothyronine (CYTOMEL) 5 MCG tablet, TAKE 1 AND 1/2 TABLET BY MOUTH ONCE DAILY, Disp:  135 tablet, Rfl: 3   lisinopril (ZESTRIL) 10 MG tablet, Take 1 tablet (10 mg total) by mouth daily., Disp: 90 tablet, Rfl: 1   metoprolol tartrate (LOPRESSOR) 25 MG tablet, Take 1 tablet (25 mg total) by mouth 2 (two) times daily., Disp: 180 tablet, Rfl: 1  Review of Systems:  Negative unless indicated in HPI.   Physical Exam: Vitals:   10/22/22 0940  BP: 110/80  Pulse: (!) 53  Temp: 98.2 F (36.8 C)  TempSrc: Oral  SpO2: 98%  Weight: 261 lb 9.6 oz (118.7 kg)    Body mass index is 47.85 kg/m.   Physical Exam Vitals reviewed.  Constitutional:      Appearance: Normal appearance.  HENT:     Head: Normocephalic and atraumatic.  Eyes:     Conjunctiva/sclera: Conjunctivae normal.     Pupils: Pupils are equal, round, and reactive to light.  Cardiovascular:     Rate and Rhythm: Normal rate and regular rhythm.  Pulmonary:     Effort: Pulmonary effort is normal.     Breath sounds: Normal breath sounds.  Skin:    General: Skin is warm and dry.  Neurological:     General: No focal deficit present.     Mental Status: She is alert and oriented to person, place, and time.  Psychiatric:        Mood and Affect: Mood normal.        Behavior: Behavior normal.        Thought Content: Thought content normal.        Judgment: Judgment normal.      Impression and Plan:  IGT (impaired glucose tolerance) Assessment & Plan: A1c remains stable at 5.8.  Orders: -     POCT glycosylated hemoglobin (Hb A1C)  Essential hypertension Assessment & Plan: Well-controlled on current medications.   Acquired hypothyroidism Assessment & Plan: TSH was within goal at last check at 1.080.   Memory loss Assessment & Plan:    10/22/2022   10:03 AM  MMSE - Mini Mental State Exam  Orientation to time 3  Orientation to Place 5  Registration 3  Attention/ Calculation 5  Recall 3  Language- name 2 objects 2  Language- repeat 1  Language- follow 3 step command 3  Language- read &  follow direction 1  Write a sentence 1  Copy design 1  Total score 28    No significant cognitive deficit identified.      Time spent:35 minutes reviewing chart, interviewing and examining patient and formulating plan of  care.     Chaya Jan, MD [facility]

## 2022-10-22 NOTE — Assessment & Plan Note (Signed)
A1c remains stable at 5.8.

## 2022-10-22 NOTE — Assessment & Plan Note (Signed)
    10/22/2022   10:03 AM  MMSE - Mini Mental State Exam  Orientation to time 3  Orientation to Place 5  Registration 3  Attention/ Calculation 5  Recall 3  Language- name 2 objects 2  Language- repeat 1  Language- follow 3 step command 3  Language- read & follow direction 1  Write a sentence 1  Copy design 1  Total score 28    No significant cognitive deficit identified.

## 2022-10-22 NOTE — Assessment & Plan Note (Signed)
TSH was within goal at last check at 1.080.

## 2022-10-27 ENCOUNTER — Ambulatory Visit: Payer: BC Managed Care – PPO | Admitting: Nurse Practitioner

## 2022-10-27 ENCOUNTER — Encounter: Payer: Self-pay | Admitting: Nurse Practitioner

## 2022-10-27 VITALS — BP 130/62 | HR 61 | Temp 98.2°F | Ht 62.0 in | Wt 265.2 lb

## 2022-10-27 DIAGNOSIS — Z6841 Body Mass Index (BMI) 40.0 and over, adult: Secondary | ICD-10-CM | POA: Diagnosis not present

## 2022-10-27 DIAGNOSIS — G4733 Obstructive sleep apnea (adult) (pediatric): Secondary | ICD-10-CM

## 2022-10-27 NOTE — Progress Notes (Signed)
Reviewed and agree with assessment/plan.   Coralyn Helling, MD [facility] 10/27/2022, 10:53 AM Pager:  [phone]

## 2022-10-27 NOTE — Assessment & Plan Note (Signed)
BMI 48. Healthy weight loss encouraged.

## 2022-10-27 NOTE — Assessment & Plan Note (Signed)
Severe OSA on CPAP. Excellent control and compliance. No significant leaks. Receives benefit from use. Feels like pressure settings need to be adjusted; average pressure 19.8. Will increase her to 16-20 cmH2O. Discuss possibly setting her at a set pressure of 19 cmH2O if she continues to have difficulties. She would like to try the auto for now. Aware of safe driving practices. Understands proper use/care of device.  Patient Instructions  Continue to use CPAP every night, minimum of 4-6 hours a night.  Change equipment every 30 days or as directed by DME. Wash your tubing with warm soap and water daily, hang to dry. Wash humidifier portion weekly.  Be aware of reduced alertness and do not drive or operate heavy machinery if experiencing this or drowsiness.  Exercise encouraged, as tolerated. Notify if persistent daytime sleepiness occurs even with consistent use of CPAP.   Adjust CPAP pressures to 16-20 cmH2O  Follow up with Dr. Vassie Loll, Dr. Wynona Neat or Katie Rashi Granier,NP in 6 months, or sooner, if needed

## 2022-10-27 NOTE — Patient Instructions (Addendum)
Continue to use CPAP every night, minimum of 4-6 hours a night.  Change equipment every 30 days or as directed by DME. Wash your tubing with warm soap and water daily, hang to dry. Wash humidifier portion weekly.  Be aware of reduced alertness and do not drive or operate heavy machinery if experiencing this or drowsiness.  Exercise encouraged, as tolerated. Notify if persistent daytime sleepiness occurs even with consistent use of CPAP.   Adjust CPAP pressures to 16-20 cmH2O  Follow up with Dr. Vassie Loll, Dr. Wynona Neat or Katie Jozette Castrellon,NP in 6 months, or sooner, if needed

## 2022-10-27 NOTE — Progress Notes (Signed)
@Patient  ID: Mary Reyes, female    DOB: [birth year], 62 y.o.   MRN: [id number]  Chief Complaint  Patient presents with   Follow-up    OSA follow up, using cpap, denies issues    Referring provider: Philip Aspen, [place]*  HPI: 62 year old female, never smoker previously followed for OSA and referred for sleep consult. Past medical history significant for HTN, allergic rhinitis, GERD, hypothyroid, depression.   TEST/EVENTS:  2003 PSG: AHI 57  07/30/2022: OV with Richad Ramsay NP for sleep consult and to re-establish care for management of her OSA. She has been on CPAP for many years. Wears it nightly. Doesn't think she could sleep without it. She wakes feeling rested. No significant daytime fatigue. Denies any morning headaches or drowsy driving. Her machine gave her a motor life warning. It is well over 47 years old. [employer] told her she needed a new appointment to get a new machine. Wears a full face mask. No trouble with leaks. Machine is set at 14-20 cmH2O.  She goes to bed between 9-8 pm. Falls asleep quickly. Wakes 2-3 times to use the bathroom. Gets up around 8 am. She does sleep long periods, usually about 10 hours. She takes Abilify and occasionally a xanax before bed; no other sleep aids. She is not employed. Last sleep study was sometime around 2003. She has a history of hypertension, well controlled on antihypertensives. No hx of stroke, DM, or cardiac arrhythmia.  She is a never smoker. Doesn't drink alcohol. No excessive caffeine intake. Lives with her husband. Family history of allergies, asthma, Reyes. [place] 7  10/27/2022: Today - follow up Patient presents today for follow up after receiving new CPAP machine. She is doing well on this for the most part. She feels like her bottom number may need to be brought up a little bit. Feels better with the higher pressures. She's sleeping well at night. She wakes feeling rested. Does have some daytime tiredness that is unchanged but  doesn't impact her daily life. Denies morning headaches, drowsy driving or sleep parasomnias/paralysis. She is not having any significant leaks.  09/26/2022-10/25/2022: CPAP 14-20 cmH2O 30/30 days; 100% >4 hr; average use 11 hr 27 min Pressure 95th 19.8 Leaks 95th 11 AHI 4.7  Allergies  Allergen Reactions   Adhesive [Tape] Rash   Benzoyl Peroxide Rash    Benzine    Milk-Related Compounds Rash    Immunization History  Administered Date(s) Administered   Influenza,inj,Quad PF,6+ Mos 03/30/2014, 01/16/2015, 02/14/2019, 01/10/2020, 12/11/2020, 01/14/2022   PFIZER(Purple Top)SARS-COV-2 Vaccination 06/03/2019, 07/04/2019, 12/29/2019   Tdap 05/06/2018   Zoster Recombinant(Shingrix) 07/09/2020, 10/15/2020    Past Medical History:  Diagnosis Date   Allergy    Anxiety    Arthritis    Blood in stool    Colon polyp    Depression    Eczema    Frequent headaches    GERD (gastroesophageal reflux disease)    Hypertension    Hypothyroidism    Migraine    OSA (obstructive sleep apnea)    CPAP nightly   Osteopenia    Sleep apnea    Thyroid disease    Trimalleolar fracture of ankle, closed, left, sequela     Tobacco History: Social History   Tobacco Use  Smoking Status Never  Smokeless Tobacco Never   Counseling given: Not Answered   Outpatient Medications Prior to Visit  Medication Sig Dispense Refill   ALPRAZolam (XANAX) 0.25 MG tablet Take 0.25 mg by mouth at bedtime as  needed for anxiety or sleep.  0   ARIPiprazole (ABILIFY) 2 MG tablet Take 2 mg by mouth daily.     Azelastine HCl 0.15 % SOLN Place into the nose.     b complex vitamins capsule Take 1 capsule by mouth daily.     buPROPion (WELLBUTRIN XL) 300 MG 24 hr tablet Take 300 mg by mouth every morning.     calcium-vitamin D (OSCAL WITH D) 500-200 MG-UNIT tablet Take 1 tablet by mouth 3 (three) times daily. 90 tablet 12   cholecalciferol (VITAMIN D) 1000 units tablet Take 2,000 Units by mouth at bedtime.      cyclobenzaprine (FLEXERIL) 10 MG tablet Take 1 tablet (10 mg total) by mouth 3 (three) times daily as needed for muscle spasms. 90 tablet 1   desvenlafaxine (PRISTIQ) 100 MG 24 hr tablet Take 100 mg by mouth every morning.     famotidine-calcium carbonate-magnesium hydroxide (PEPCID COMPLETE) 10-800-165 MG chewable tablet Chew 1 tablet by mouth daily as needed.     fexofenadine (ALLEGRA) 180 MG tablet Take by mouth.     fluticasone (FLONASE) 50 MCG/ACT nasal spray Place into both nostrils daily.     ibuprofen (ADVIL,MOTRIN) 200 MG tablet Take 400 mg by mouth every 6 (six) hours as needed for headache, mild pain or moderate pain.     levothyroxine (SYNTHROID) 50 MCG tablet TAKE 1 TABLET BY MOUTH EVERY MORNING MONDAY-SATURDAY AND TAKE 2 TABLETS BY MOUTH ON SUNDAY 105 tablet 3   liothyronine (CYTOMEL) 5 MCG tablet TAKE 1 AND 1/2 TABLET BY MOUTH ONCE DAILY 135 tablet 3   lisinopril (ZESTRIL) 10 MG tablet Take 1 tablet (10 mg total) by mouth daily. 90 tablet 1   metoprolol tartrate (LOPRESSOR) 25 MG tablet Take 1 tablet (25 mg total) by mouth 2 (two) times daily. 180 tablet 1   No facility-administered medications prior to visit.     Review of Systems:   Constitutional: No weight loss or gain, night sweats, fevers, chills, or lassitude. +occasional fatigue  HEENT: No headaches, difficulty swallowing, tooth/dental problems, or sore throat. No sneezing, itching, ear ache, nasal congestion, or post nasal drip CV:  No chest pain, orthopnea, PND, swelling in lower extremities, anasarca, dizziness, palpitations, syncope Resp: No shortness of breath with exertion or at rest. No excess mucus or change in color of mucus. No productive or non-productive. No hemoptysis. No wheezing.  No chest wall deformity GI:  No heartburn, indigestion GU: No dysuria, change in color of urine, urgency or frequency.   Skin: No rash, lesions, ulcerations MSK:  No joint pain or swelling.   Neuro: No dizziness or  lightheadedness.  Psych: No SI/HI. Mood stable.     Physical Exam:  BP 130/62 (BP Location: Left Arm, Patient Position: Sitting, Cuff Size: Large)   Pulse 61   Temp 98.2 F (36.8 C) (Oral)   Ht 5\' 2"  (1.575 m)   Wt 265 lb 3.2 oz (120.3 kg)   SpO2 97%   BMI 48.51 kg/m   GEN: Pleasant, interactive, well-appearing; morbidly obese; in no acute distress. HEENT:  Normocephalic and atraumatic. PERRLA. Sclera white. Nasal turbinates pink, moist and patent bilaterally. No rhinorrhea present. Oropharynx pink and moist, without exudate or edema. No lesions, ulcerations, or postnasal drip. Mallampati IV NECK:  Supple w/ fair ROM. No JVD present. Normal carotid impulses w/o bruits. Thyroid symmetrical with no goiter or nodules palpated. No lymphadenopathy.   CV: RRR, no m/r/g, no peripheral edema. Pulses intact, +2 bilaterally. No cyanosis,  pallor or clubbing. PULMONARY:  Unlabored, regular breathing. Clear bilaterally A&P w/o wheezes/rales/rhonchi. No accessory muscle use.  GI: BS present and normoactive. Soft, non-tender to palpation. No organomegaly or masses detected.  MSK: No erythema, warmth or tenderness. Cap refil <2 sec all extrem. No deformities or joint swelling noted.  Neuro: A/Ox3. No focal deficits noted.   Skin: Warm, no lesions or rashe Psych: Normal affect and behavior. Judgement and thought content appropriate.     Lab Results:  CBC    Component Value Date/Time   WBC 4.5 07/16/2022 1028   RBC 4.77 07/16/2022 1028   HGB 14.1 07/16/2022 1028   HCT 42.0 07/16/2022 1028   PLT 235.0 07/16/2022 1028   MCV 88.1 07/16/2022 1028   MCH 29.4 09/17/2020 1424   MCHC 33.5 07/16/2022 1028   RDW 13.2 07/16/2022 1028   LYMPHSABS 1.3 07/16/2022 1028   MONOABS 0.4 07/16/2022 1028   EOSABS 0.1 07/16/2022 1028   BASOSABS 0.0 07/16/2022 1028    BMET    Component Value Date/Time   NA 137 07/16/2022 1028   K 4.2 07/16/2022 1028   CL 98 07/16/2022 1028   CO2 31 07/16/2022 1028    GLUCOSE 108 (H) 07/16/2022 1028   BUN 14 07/16/2022 1028   CREATININE 1.20 07/16/2022 1028   CREATININE 0.92 01/10/2020 1413   CALCIUM 9.4 07/16/2022 1028   GFRNONAA >60 09/17/2020 1424   GFRAA >60 02/11/2016 1400    BNP No results found for: "BNP"   Imaging:  No results found.  Administration History     None           No data to display          No results found for: "NITRICOXIDE"      Assessment & Plan:   OSA (obstructive sleep apnea) Severe OSA on CPAP. Excellent control and compliance. No significant leaks. Receives benefit from use. Feels like pressure settings need to be adjusted; average pressure 19.8. Will increase her to 16-20 cmH2O. Discuss possibly setting her at a set pressure of 19 cmH2O if she continues to have difficulties. She would like to try the auto for now. Aware of safe driving practices. Understands proper use/care of device.  Patient Instructions  Continue to use CPAP every night, minimum of 4-6 hours a night.  Change equipment every 30 days or as directed by DME. Wash your tubing with warm soap and water daily, hang to dry. Wash humidifier portion weekly.  Be aware of reduced alertness and do not drive or operate heavy machinery if experiencing this or drowsiness.  Exercise encouraged, as tolerated. Notify if persistent daytime sleepiness occurs even with consistent use of CPAP.   Adjust CPAP pressures to 16-20 cmH2O  Follow up with Dr. Vassie Loll, Dr. Wynona Neat or Florentina Addison Arlenne Kimbley,NP in 6 months, or sooner, if needed    Class 3 severe obesity with body mass index (BMI) of 45.0 to 49.9 in adult (HCC) BMI 48. Healthy weight loss encouraged.    I spent 31 minutes of dedicated to the care of this patient on the date of this encounter to include pre-visit review of records, face-to-face time with the patient discussing conditions above, post visit ordering of testing, clinical documentation with the electronic health record, making appropriate  referrals as documented, and communicating necessary findings to members of the patients care team.  Noemi Chapel, NP 10/27/2022  Pt aware and understands NP's role.

## 2022-10-29 DIAGNOSIS — F3341 Major depressive disorder, recurrent, in partial remission: Secondary | ICD-10-CM | POA: Diagnosis not present

## 2022-10-29 DIAGNOSIS — F419 Anxiety disorder, unspecified: Secondary | ICD-10-CM | POA: Diagnosis not present

## 2022-11-20 ENCOUNTER — Other Ambulatory Visit: Payer: Self-pay | Admitting: Internal Medicine

## 2022-11-20 DIAGNOSIS — I10 Essential (primary) hypertension: Secondary | ICD-10-CM

## 2022-11-26 ENCOUNTER — Other Ambulatory Visit: Payer: Self-pay | Admitting: Internal Medicine

## 2022-11-26 DIAGNOSIS — I10 Essential (primary) hypertension: Secondary | ICD-10-CM

## 2022-12-04 ENCOUNTER — Other Ambulatory Visit: Payer: Self-pay | Admitting: Internal Medicine

## 2022-12-04 DIAGNOSIS — Z1231 Encounter for screening mammogram for malignant neoplasm of breast: Secondary | ICD-10-CM

## 2022-12-08 DIAGNOSIS — G4733 Obstructive sleep apnea (adult) (pediatric): Secondary | ICD-10-CM | POA: Diagnosis not present

## 2022-12-18 ENCOUNTER — Ambulatory Visit: Payer: BC Managed Care – PPO

## 2022-12-22 ENCOUNTER — Other Ambulatory Visit: Payer: Self-pay

## 2022-12-22 ENCOUNTER — Encounter (HOSPITAL_BASED_OUTPATIENT_CLINIC_OR_DEPARTMENT_OTHER): Payer: Self-pay

## 2022-12-22 ENCOUNTER — Emergency Department (HOSPITAL_BASED_OUTPATIENT_CLINIC_OR_DEPARTMENT_OTHER)
Admission: EM | Admit: 2022-12-22 | Discharge: 2022-12-22 | Disposition: A | Discharge status: Home / Self Care | Payer: BC Managed Care – PPO

## 2022-12-22 DIAGNOSIS — R110 Nausea: Secondary | ICD-10-CM | POA: Diagnosis not present

## 2022-12-22 DIAGNOSIS — R001 Bradycardia, unspecified: Secondary | ICD-10-CM | POA: Diagnosis not present

## 2022-12-22 DIAGNOSIS — R42 Dizziness and giddiness: Secondary | ICD-10-CM | POA: Diagnosis not present

## 2022-12-22 LAB — BASIC METABOLIC PANEL
Anion gap: 8 (ref 5–15)
BUN: 12 mg/dL (ref 8–23)
CO2: 29 mmol/L (ref 22–32)
Calcium: 9.1 mg/dL (ref 8.9–10.3)
Chloride: 101 mmol/L (ref 98–111)
Creatinine, Ser: 0.97 mg/dL (ref 0.44–1.00)
GFR, Estimated: >60 mL/min (ref >60)
Glucose, Bld: 129 mg/dL — ABNORMAL HIGH (ref 70–99)
Potassium: 3.9 mmol/L (ref 3.5–5.1)
Sodium: 138 mmol/L (ref 135–145)

## 2022-12-22 LAB — CBC
HCT: 41.7 % (ref 36.0–46.0)
Hemoglobin: 14.2 g/dL (ref 12.0–15.0)
MCH: 29.5 pg (ref 26.0–34.0)
MCHC: 34.1 g/dL (ref 30.0–36.0)
MCV: 86.7 fL (ref 80.0–100.0)
Platelets: 251 10*3/uL (ref 150–400)
RBC: 4.81 MIL/uL (ref 3.87–5.11)
RDW: 12.8 % (ref 11.5–15.5)
WBC: 3.6 10*3/uL — ABNORMAL LOW (ref 4.0–10.5)
nRBC: 0 % (ref 0.0–0.2)

## 2022-12-22 LAB — CBG MONITORING, ED: Glucose-Capillary: 118 mg/dL — ABNORMAL HIGH (ref 70–99)

## 2022-12-22 MED ORDER — SODIUM CHLORIDE 0.9 % IV BOLUS
500.0000 mL | Freq: Once | INTRAVENOUS | Status: AC
  Administered 2022-12-22: 500 mL via INTRAVENOUS

## 2022-12-22 MED ORDER — SCOPOLAMINE 1 MG/3DAYS TD PT72
1.0000 | MEDICATED_PATCH | TRANSDERMAL | Status: DC
  Filled 2022-12-22: qty 1

## 2022-12-22 MED ORDER — ONDANSETRON 4 MG PO TBDP
4.0000 mg | ORAL_TABLET | Freq: Once | ORAL | Status: AC
  Administered 2022-12-22: 4 mg via ORAL
  Filled 2022-12-22: qty 1

## 2022-12-22 MED ORDER — MECLIZINE HCL 25 MG PO TABS
25.0000 mg | ORAL_TABLET | Freq: Once | ORAL | Status: AC
  Administered 2022-12-22: 25 mg via ORAL
  Filled 2022-12-22: qty 1

## 2022-12-22 MED ORDER — MECLIZINE HCL 25 MG PO TABS: 25.0000 mg | ORAL_TABLET | Freq: Three times a day (TID) | ORAL | 0 refills | Status: AC | PRN

## 2022-12-22 NOTE — ED Provider Notes (Signed)
[place] EMERGENCY DEPARTMENT AT [facility] Provider Note   CSN: [id number] Arrival date & time: 12/22/22  0940     History  No chief complaint on file.   Mary Reyes is a 62 y.o. female.  62 year old female presenting emergency department with vertigo.  Has a history of similar symptoms 1 years ago.  Reports onset yesterday morning when she awoke symptoms lasted approximately for 2 hours, and were intermittent during that time.  And then improved.  Reported they returned again this morning, but again are slowly improving.  They are worsened with head movement and last for several seconds with rest.  Associated with nausea, but no vomiting.  Denies headache, vision loss, changes, unilateral weakness, facial droop.  No recent URIs.  No fevers.        Home Medications Prior to Admission medications   Medication Sig Start Date End Date Taking? Authorizing Provider  meclizine (ANTIVERT) 25 MG tablet Take 1 tablet (25 mg total) by mouth 3 (three) times daily as needed for up to 7 days for dizziness. 12/22/22 12/29/22 Yes Ginna Schuur, Harmon Dun, DO  ALPRAZolam Prudy Feeler) 0.25 MG tablet Take 0.25 mg by mouth at bedtime as needed for anxiety or sleep.    [provider]  ARIPiprazole (ABILIFY) 2 MG tablet Take 2 mg by mouth daily.    [provider]  Azelastine HCl 0.15 % SOLN Place into the nose.    [provider]  b complex vitamins capsule Take 1 capsule by mouth daily.    [provider]  buPROPion (WELLBUTRIN XL) 300 MG 24 hr tablet Take 300 mg by mouth every morning. 12/23/19   [provider]  calcium-vitamin D (OSCAL WITH D) 500-200 MG-UNIT tablet Take 1 tablet by mouth 3 (three) times daily. 02/12/16   Tarry Kos, MD  cholecalciferol (VITAMIN D) 1000 units tablet Take 2,000 Units by mouth at bedtime.    [provider]  cyclobenzaprine (FLEXERIL) 10 MG tablet Take 1 tablet (10 mg total) by mouth 3 (three) times daily as  needed for muscle spasms. 07/16/22   Philip Aspen, Limmie Patricia, MD  desvenlafaxine (PRISTIQ) 100 MG 24 hr tablet Take 100 mg by mouth every morning. 11/18/19   [provider]  famotidine-calcium carbonate-magnesium hydroxide (PEPCID COMPLETE) 10-800-165 MG chewable tablet Chew 1 tablet by mouth daily as needed.    [provider]  fexofenadine (ALLEGRA) 180 MG tablet Take by mouth.    [provider]  fluticasone (FLONASE) 50 MCG/ACT nasal spray Place into both nostrils daily.    [provider]  ibuprofen (ADVIL,MOTRIN) 200 MG tablet Take 400 mg by mouth every 6 (six) hours as needed for headache, mild pain or moderate pain.    [provider]  levothyroxine (SYNTHROID) 50 MCG tablet TAKE 1 TABLET BY MOUTH EVERY MORNING MONDAY-SATURDAY AND TAKE 2 TABLETS BY MOUTH ON SUNDAY 05/08/22   Carlus Pavlov, MD  liothyronine (CYTOMEL) 5 MCG tablet TAKE 1 AND 1/2 TABLET BY MOUTH ONCE DAILY 05/08/22   Carlus Pavlov, MD  lisinopril (ZESTRIL) 10 MG tablet TAKE 1 TABLET DAILY 11/24/22   Philip Aspen, Limmie Patricia, MD  metoprolol tartrate (LOPRESSOR) 25 MG tablet TAKE 1 TABLET TWICE A DAY 12/01/22   Philip Aspen, Limmie Patricia, MD      Allergies    Adhesive [tape], Benzoyl peroxide, and Milk-related compounds    Review of Systems   Review of Systems  Physical Exam Updated Vital Signs BP (!) 168/68  Pulse (!) 54   Temp 98.1 F (36.7 C)   Resp 19   Ht 5\' 2"  (1.575 m)   Wt 117.9 kg   SpO2 99%   BMI 47.55 kg/m  Physical Exam Vitals and nursing note reviewed.  Constitutional:      General: She is not in acute distress. HENT:     Head: Normocephalic and atraumatic.     Nose: Nose normal.     Mouth/Throat:     Mouth: Mucous membranes are moist.  Eyes:     Extraocular Movements: Extraocular movements intact.     Conjunctiva/sclera: Conjunctivae normal.     Pupils: Pupils are equal, round, and reactive to light.  Cardiovascular:     Rate and Rhythm:  Normal rate and regular rhythm.  Pulmonary:     Effort: Pulmonary effort is normal.     Breath sounds: Normal breath sounds.  Abdominal:     General: Abdomen is flat. There is no distension.     Tenderness: There is no abdominal tenderness. There is no guarding.  Musculoskeletal:        General: Normal range of motion.     Cervical back: Normal range of motion.  Skin:    General: Skin is warm and dry.     Capillary Refill: Capillary refill takes less than 2 seconds.  Neurological:     Mental Status: She is alert and oriented to person, place, and time.     Cranial Nerves: No cranial nerve deficit.     Sensory: Sensory deficit (at baselin) present.     Motor: No weakness.     Coordination: Coordination normal.     ED Results / Procedures / Treatments   Labs (all labs ordered are listed, but only abnormal results are displayed) Labs Reviewed  CBC - Abnormal; Notable for the following components:      Result Value   WBC 3.6 (*)    All other components within normal limits  BASIC METABOLIC PANEL - Abnormal; Notable for the following components:   Glucose, Bld 129 (*)    All other components within normal limits  CBG MONITORING, ED - Abnormal; Notable for the following components:   Glucose-Capillary 118 (*)    All other components within normal limits    EKG EKG Interpretation Date/Time:  Tuesday December 22 2022 09:51:07 EDT Ventricular Rate:  54 PR Interval:  188 QRS Duration:  86 QT Interval:  448 QTC Calculation: 424 R Axis:   76  Text Interpretation: Sinus bradycardia Septal infarct , age undetermined Abnormal ECG When compared with ECG of 17-Sep-2020 14:15, Septal infarct is now Present Confirmed by Estanislado Pandy [phone]) on 12/22/2022 11:20:57 AM  Radiology No results found.  Procedures Procedures    Medications Ordered in ED Medications  meclizine (ANTIVERT) tablet 25 mg (25 mg Oral Given 12/22/22 1043)  ondansetron (ZOFRAN-ODT) disintegrating tablet 4 mg  (4 mg Oral Given 12/22/22 1043)  sodium chloride 0.9 % bolus 500 mL (0 mLs Intravenous Stopped 12/22/22 1215)    ED Course/ Medical Decision Making/ A&P Clinical Course as of 12/22/22 1507  Tue Dec 22, 2022  1050 MRI 09/17/20: "IMPRESSION: 1. No acute intracranial abnormality. 2. Mild-to-moderate chronic microvascular ischemic disease. 3. Probable DVA at the subcortical right frontal lobe. Associated T2/FLAIR signal intensity within this region likely reflects a degree of associated steal phenomenon. 4. Prominent bilateral mastoid effusions. Correlation with physical exam recommended. " [TY]  1212 Patient ambulated with steady gait in department.  Asymptomatic currently.  Vital signs reassuring.  Labs with no significant metabolic derangements.  No leukocytosis to suggest systemic infection.  No electrolyte derangements.  Normal kidney function.  Blood glucose negative.  EKG without arrhythmia or ST segment changes to get ischemia.  Again patient's presentation and physical exam with likely peripheral vertigo.  Stable for discharge at this time.  Return precautions given. [TY]    Clinical Course User Index [TY] Coral Spikes, DO                                 Medical Decision Making 62 year old female presenting emergency department for vertigo.  She is afebrile, slightly bradycardic but hemodynamically stable.  Physical exam reassuring with no localizing neurodeficits.  Exam consistent with peripheral cause of vertigo.  Will get basic labs, and treat with meclizine and reevaluate.  If no improvement will consider imaging.  See ED course for further MDM and disposition.  Amount and/or Complexity of Data Reviewed Independent Historian: spouse    Details: Notes that patient has abnormal sensation over her whole body and has been worked up extensively at [facility].  Patient reports no change in sensorium today. External Data Reviewed:     Details: See ED course Labs: ordered. Radiology:      Details: See ED course ECG/medicine tests:     Details: See ED course  Risk Prescription drug management.          Final Clinical Impression(s) / ED Diagnoses Final diagnoses:  Vertigo    Rx / DC Orders ED Discharge Orders          Ordered    meclizine (ANTIVERT) 25 MG tablet  3 times daily PRN        12/22/22 1214              Coral Spikes, DO 12/22/22 1507

## 2022-12-22 NOTE — ED Triage Notes (Signed)
In for eval of intermittent dizziness onset yesterday. Slight nausea. Denies vomiting. Similar episode about 1 year ago. Unknown diagnosis.

## 2022-12-22 NOTE — ED Notes (Signed)
Provider requested that the Pt be ambulated, preferably without the walk since that's her norm... Pt was ambulated without the walker... Pt had no obvious lean or struggle to ambulate... Pt stated that everything felt fine when ambulating, no dizziness/lightheaded or blurry vision... Family agreed that the pt ambulating appeared to be her norm.Marland KitchenMarland Kitchen

## 2022-12-22 NOTE — Discharge Instructions (Signed)
Up with your primary doctor.  Return immediately if develop fevers, chills, vision changes, facial droop, unilateral weakness, vertigo returns, or if you are unable to walk in a straight line, or your movements become uncoordinated.  You may also return if develop any chest pain, shortness of breath, passout or any new or worsening symptoms that are concerning to you.

## 2022-12-22 NOTE — ED Notes (Signed)
Discharge paperwork given and verbally understood.

## 2022-12-30 ENCOUNTER — Ambulatory Visit: Payer: BC Managed Care – PPO | Admitting: Internal Medicine

## 2023-01-04 ENCOUNTER — Ambulatory Visit (INDEPENDENT_AMBULATORY_CARE_PROVIDER_SITE_OTHER): Payer: BC Managed Care – PPO | Admitting: Family Medicine

## 2023-01-04 ENCOUNTER — Encounter: Payer: Self-pay | Admitting: Family Medicine

## 2023-01-04 VITALS — BP 136/70 | HR 55 | Temp 99.0°F | Ht 62.0 in | Wt 260.2 lb

## 2023-01-04 DIAGNOSIS — Z23 Encounter for immunization: Secondary | ICD-10-CM

## 2023-01-04 DIAGNOSIS — R42 Dizziness and giddiness: Secondary | ICD-10-CM | POA: Diagnosis not present

## 2023-01-04 MED ORDER — MECLIZINE HCL 25 MG PO TABS: 25.0000 mg | ORAL_TABLET | ORAL | 0 refills | Status: AC | PRN

## 2023-01-04 NOTE — Progress Notes (Signed)
   Subjective:    Patient ID: Mary Reyes, female    DOB: 01-25-1961, 62 y.o.   MRN: [id number]  HPI Here to follow up an ED visit on 12-22-22 for dizziness. The day before she developed dizziness which was brought on by moving her head quickly. No hx of trauma. She has had vertigo in the past. At the ED her labs and EKG were normal. A brain MRI showed no acute changes but there were bilateral mastoid effusions. She was given IV fluids and Zofran, and sent home with Meclizine. Since then she has felt completely back to normal. She notes that this happened once before about one year ago.    Review of Systems  Constitutional: Negative.   Respiratory: Negative.    Cardiovascular: Negative.   Neurological: Negative.        Objective:   Physical Exam Constitutional:      Appearance: Normal appearance.  Cardiovascular:     Rate and Rhythm: Normal rate and regular rhythm.     Pulses: Normal pulses.     Heart sounds: Normal heart sounds.  Pulmonary:     Effort: Pulmonary effort is normal.     Breath sounds: Normal breath sounds.  Neurological:     General: No focal deficit present.     Mental Status: She is alert and oriented to person, place, and time.     Coordination: Coordination normal.     Gait: Gait normal.           Assessment & Plan:  She had a bout of vertigo, and this seems to have resolved. We will give her some more Meclizine to keep on hand, and she will keep herself hydrated. She will follow up as needed. We spent a total of (33   ) minutes reviewing records and discussing these issues.  Gershon Crane, MD   Gershon Crane, MD

## 2023-01-11 ENCOUNTER — Ambulatory Visit
Admission: RE | Admit: 2023-01-11 | Discharge: 2023-01-11 | Disposition: A | Discharge status: Home / Self Care | Payer: BC Managed Care – PPO | Source: Ambulatory Visit | Attending: Internal Medicine | Admitting: Internal Medicine

## 2023-01-11 DIAGNOSIS — Z1231 Encounter for screening mammogram for malignant neoplasm of breast: Secondary | ICD-10-CM | POA: Diagnosis not present

## 2023-01-13 ENCOUNTER — Ambulatory Visit: Payer: BC Managed Care – PPO | Admitting: Internal Medicine

## 2023-01-26 ENCOUNTER — Telehealth: Payer: Self-pay

## 2023-01-26 NOTE — Telephone Encounter (Signed)
Transition Care Management Follow-up Telephone Call Date of discharge and from where: [facility] 9/24 How have you been since you were released from the hospital? Doing fine and has followed up with provider  Any questions or concerns? No  Items Reviewed: Did the pt receive and understand the discharge instructions provided? Yes  Medications obtained and verified? Yes  Other? No  Any new allergies since your discharge? No  Dietary orders reviewed? No Do you have support at home? Yes     Follow up appointments reviewed:  PCP Hospital f/u appt confirmed? Yes  Scheduled to see  on  @ . Specialist Hospital f/u appt confirmed? Yes  Scheduled to see  on @ . Are transportation arrangements needed? No  If their condition worsens, is the pt aware to call PCP or go to the Emergency Dept.? Yes Was the patient provided with contact information for the PCP's office or ED? Yes Was to pt encouraged to call back with questions or concerns? Yes

## 2023-02-17 ENCOUNTER — Other Ambulatory Visit (HOSPITAL_BASED_OUTPATIENT_CLINIC_OR_DEPARTMENT_OTHER): Payer: Self-pay

## 2023-02-17 MED ORDER — AREXVY 120 MCG/0.5ML IM SUSR
0.5000 mL | Freq: Once | INTRAMUSCULAR | 0 refills | Status: AC
  Filled 2023-02-17: qty 0.5, 1d supply, fill #0

## 2023-03-16 DIAGNOSIS — G4733 Obstructive sleep apnea (adult) (pediatric): Secondary | ICD-10-CM | POA: Diagnosis not present

## 2023-03-30 DIAGNOSIS — G4733 Obstructive sleep apnea (adult) (pediatric): Secondary | ICD-10-CM | POA: Diagnosis not present

## 2023-04-22 ENCOUNTER — Ambulatory Visit: Payer: BC Managed Care – PPO | Admitting: Internal Medicine

## 2023-04-22 ENCOUNTER — Encounter: Payer: Self-pay | Admitting: Internal Medicine

## 2023-04-22 VITALS — BP 154/75 | HR 65 | Temp 98.7°F | Wt 259.1 lb

## 2023-04-22 DIAGNOSIS — E039 Hypothyroidism, unspecified: Secondary | ICD-10-CM

## 2023-04-22 DIAGNOSIS — I10 Essential (primary) hypertension: Secondary | ICD-10-CM | POA: Diagnosis not present

## 2023-04-22 DIAGNOSIS — E559 Vitamin D deficiency, unspecified: Secondary | ICD-10-CM | POA: Diagnosis not present

## 2023-04-22 DIAGNOSIS — R7302 Impaired glucose tolerance (oral): Secondary | ICD-10-CM

## 2023-04-22 LAB — POCT GLYCOSYLATED HEMOGLOBIN (HGB A1C): Hemoglobin A1C: 5.8 % — AB (ref 4.0–5.6)

## 2023-04-22 NOTE — Progress Notes (Signed)
Established Patient Office Visit     CC/Reason for Visit: Follow-up chronic conditions  HPI: Mary Reyes is a 63 y.o. female who is coming in today for the above mentioned reasons. Past Medical History is significant for: Hypertension, impaired glucose tolerance, morbid obesity, hypothyroidism, OSA, GERD, depression, vitamin D deficiency.  She is feeling well without acute concerns or complaints.   Past Medical/Surgical History: Past Medical History:  Diagnosis Date   Allergy    Anxiety    Arthritis    Blood in stool    Colon polyp    Depression    Eczema    Frequent headaches    GERD (gastroesophageal reflux disease)    Hypertension    Hypothyroidism    Migraine    OSA (obstructive sleep apnea)    CPAP nightly   Osteopenia    Sleep apnea    Thyroid disease    Trimalleolar fracture of ankle, closed, left, sequela     Past Surgical History:  Procedure Laterality Date   ABDOMINAL HYSTERECTOMY  2004   ORIF ANKLE FRACTURE Left 02/12/2016   Procedure: OPEN REDUCTION INTERNAL FIXATION (ORIF) LEFT ANKLE FRACTURE;  Surgeon: Tarry Kos, MD;  Location: [facility];  Service: Orthopedics;  Laterality: Left;   WISDOM TOOTH EXTRACTION      Social History:  reports that she has never smoked. She has never used smokeless tobacco. She reports that she does not drink alcohol and does not use drugs.  Allergies: Allergies  Allergen Reactions   Adhesive [Tape] Rash   Benzoyl Peroxide Rash    Benzine    Milk-Related Compounds Rash    Family History:  Family History  Problem Relation Age of Onset   Healthy Mother    Alcohol abuse Father        Deceased   Brain cancer Sister    Asthma Sister    Stroke Maternal Grandmother    Arthritis Paternal Grandmother    Depression Daughter    Heart murmur Daughter    Allergic rhinitis Daughter    Fibromyalgia Daughter    Allergic rhinitis Daughter    Migraines Son    Allergic rhinitis Son    Angioedema  Neg Hx    Eczema Neg Hx    Immunodeficiency Neg Hx    Urticaria Neg Hx    Colon cancer Neg Hx    Esophageal cancer Neg Hx    Stomach cancer Neg Hx    Rectal cancer Neg Hx      Current Outpatient Medications:    ALPRAZolam (XANAX) 0.25 MG tablet, Take 0.25 mg by mouth at bedtime as needed for anxiety or sleep., Disp: , Rfl: 0   ARIPiprazole (ABILIFY) 2 MG tablet, Take 2 mg by mouth daily., Disp: , Rfl:    Azelastine HCl 0.15 % SOLN, Place into the nose., Disp: , Rfl:    b complex vitamins capsule, Take 1 capsule by mouth daily., Disp: , Rfl:    buPROPion (WELLBUTRIN XL) 300 MG 24 hr tablet, Take 300 mg by mouth every morning., Disp: , Rfl:    calcium-vitamin D (OSCAL WITH D) 500-200 MG-UNIT tablet, Take 1 tablet by mouth 3 (three) times daily., Disp: 90 tablet, Rfl: 12   cholecalciferol (VITAMIN D) 1000 units tablet, Take 2,000 Units by mouth at bedtime., Disp: , Rfl:    cyclobenzaprine (FLEXERIL) 10 MG tablet, Take 1 tablet (10 mg total) by mouth 3 (three) times daily as needed for muscle spasms., Disp: 90  tablet, Rfl: 1   desvenlafaxine (PRISTIQ) 100 MG 24 hr tablet, Take 100 mg by mouth every morning., Disp: , Rfl:    famotidine-calcium carbonate-magnesium hydroxide (PEPCID COMPLETE) 10-800-165 MG chewable tablet, Chew 1 tablet by mouth daily as needed., Disp: , Rfl:    fexofenadine (ALLEGRA) 180 MG tablet, Take by mouth., Disp: , Rfl:    fluticasone (FLONASE) 50 MCG/ACT nasal spray, Place into both nostrils daily., Disp: , Rfl:    ibuprofen (ADVIL,MOTRIN) 200 MG tablet, Take 400 mg by mouth every 6 (six) hours as needed for headache, mild pain or moderate pain., Disp: , Rfl:    levothyroxine (SYNTHROID) 50 MCG tablet, TAKE 1 TABLET BY MOUTH EVERY MORNING MONDAY-SATURDAY AND TAKE 2 TABLETS BY MOUTH ON SUNDAY, Disp: 105 tablet, Rfl: 3   liothyronine (CYTOMEL) 5 MCG tablet, TAKE 1 AND 1/2 TABLET BY MOUTH ONCE DAILY, Disp: 135 tablet, Rfl: 3   lisinopril (ZESTRIL) 10 MG tablet, TAKE 1  TABLET DAILY, Disp: 90 tablet, Rfl: 1   meclizine (ANTIVERT) 25 MG tablet, Take 1 tablet (25 mg total) by mouth every 4 (four) hours as needed for dizziness., Disp: 60 tablet, Rfl: 0   metoprolol tartrate (LOPRESSOR) 25 MG tablet, TAKE 1 TABLET TWICE A DAY, Disp: 180 tablet, Rfl: 1  Review of Systems:  Negative unless indicated in HPI.   Physical Exam: Vitals:   04/22/23 0944 04/22/23 0945  BP: (!) 179/89 (!) 154/75  Pulse: 65   Temp: 98.7 F (37.1 C)   TempSrc: Oral   SpO2: 99%   Weight: 259 lb 1.6 oz (117.5 kg)     Body mass index is 47.39 kg/m.   Physical Exam Vitals reviewed.  Constitutional:      Appearance: Normal appearance. She is obese.  HENT:     Head: Normocephalic and atraumatic.  Eyes:     Conjunctiva/sclera: Conjunctivae normal.     Pupils: Pupils are equal, round, and reactive to light.  Cardiovascular:     Rate and Rhythm: Normal rate and regular rhythm.  Pulmonary:     Effort: Pulmonary effort is normal.     Breath sounds: Normal breath sounds.  Skin:    General: Skin is warm and dry.  Neurological:     General: No focal deficit present.     Mental Status: She is alert and oriented to person, place, and time.  Psychiatric:        Mood and Affect: Mood normal.        Behavior: Behavior normal.        Thought Content: Thought content normal.        Judgment: Judgment normal.     Flowsheet Row Office Visit from 04/22/2023 in [facility]  PHQ-9 Total Score 4        Impression and Plan:  IGT (impaired glucose tolerance) -     POCT glycosylated hemoglobin (Hb A1C)  Vitamin D deficiency Assessment & Plan: Check levels next visit.   Essential hypertension Assessment & Plan: In office blood today is elevated.  She states it is because she has not taken her medication today.  She is otherwise adherent.  She will do ambulatory blood pressure monitoring and notify us if pressures are consistently above  130/80.   Acquired hypothyroidism Assessment & Plan: On levothyroxine 50 mg and liothyronine 5 mcg.  Last TSH was within range.      Time spent:32 minutes reviewing chart, interviewing and examining patient and formulating plan of care.  Chaya Jan, MD [facility]

## 2023-04-22 NOTE — Assessment & Plan Note (Signed)
On levothyroxine 50 mg and liothyronine 5 mcg.  Last TSH was within range.

## 2023-04-22 NOTE — Assessment & Plan Note (Signed)
Check levels next visit.

## 2023-04-22 NOTE — Assessment & Plan Note (Signed)
In office blood today is elevated.  She states it is because she has not taken her medication today.  She is otherwise adherent.  She will do ambulatory blood pressure monitoring and notify us if pressures are consistently above 130/80.

## 2023-04-25 ENCOUNTER — Other Ambulatory Visit: Payer: Self-pay | Admitting: Internal Medicine

## 2023-04-27 DIAGNOSIS — F419 Anxiety disorder, unspecified: Secondary | ICD-10-CM | POA: Diagnosis not present

## 2023-04-27 DIAGNOSIS — R419 Unspecified symptoms and signs involving cognitive functions and awareness: Secondary | ICD-10-CM | POA: Diagnosis not present

## 2023-04-27 DIAGNOSIS — F3341 Major depressive disorder, recurrent, in partial remission: Secondary | ICD-10-CM | POA: Diagnosis not present

## 2023-04-28 DIAGNOSIS — F419 Anxiety disorder, unspecified: Secondary | ICD-10-CM | POA: Diagnosis not present

## 2023-04-28 DIAGNOSIS — F3341 Major depressive disorder, recurrent, in partial remission: Secondary | ICD-10-CM | POA: Diagnosis not present

## 2023-04-29 ENCOUNTER — Encounter: Payer: Self-pay | Admitting: Nurse Practitioner

## 2023-04-29 ENCOUNTER — Ambulatory Visit: Payer: BC Managed Care – PPO | Admitting: Nurse Practitioner

## 2023-04-29 VITALS — BP 122/68 | HR 78 | Ht 62.0 in | Wt 261.2 lb

## 2023-04-29 DIAGNOSIS — G4733 Obstructive sleep apnea (adult) (pediatric): Secondary | ICD-10-CM | POA: Diagnosis not present

## 2023-04-29 NOTE — Assessment & Plan Note (Signed)
Severe OSA on CPAP.  Excellent compliance and control.  Residual AHI seems to fluctuate between 4-6 based on previous review.  She does receive benefit from use.  Aware of proper care/use of device.  Understands risks of untreated sleep apnea.  Encouraged her to work on sleep hygiene measures.  Healthy weight loss encouraged as well.  Safe during practices reviewed.  Patient Instructions  Continue to use CPAP every night, minimum of 4-6 hours a night.  Change equipment as directed. Wash your tubing with warm soap and water daily, hang to dry. Wash humidifier portion weekly. Use bottled, distilled water and change daily Be aware of reduced alertness and do not drive or operate heavy machinery if experiencing this or drowsiness.  Exercise encouraged, as tolerated. Healthy weight management discussed.  Avoid or decrease alcohol consumption and medications that make you more sleepy, if possible. Notify if persistent daytime sleepiness occurs even with consistent use of PAP therapy.   Follow up in one year with Mary Abbas Beyene,NP or sooner, if needed

## 2023-04-29 NOTE — Patient Instructions (Signed)
Continue to use CPAP every night, minimum of 4-6 hours a night.  Change equipment as directed. Wash your tubing with warm soap and water daily, hang to dry. Wash humidifier portion weekly. Use bottled, distilled water and change daily Be aware of reduced alertness and do not drive or operate heavy machinery if experiencing this or drowsiness.  Exercise encouraged, as tolerated. Healthy weight management discussed.  Avoid or decrease alcohol consumption and medications that make you more sleepy, if possible. Notify if persistent daytime sleepiness occurs even with consistent use of PAP therapy.   Follow up in one year with Katie Jamila Slatten,NP or sooner, if needed

## 2023-04-29 NOTE — Progress Notes (Signed)
@Patient  ID: Mary Reyes, female    DOB: [birth year], 63 y.o.   MRN: [id number]  Chief Complaint  Patient presents with   Follow-up    Referring provider: Philip Aspen, [place]*  HPI: 63 year old female, never smoker previously followed for OSA on CPAP. Past medical history significant for HTN, allergic rhinitis, GERD, hypothyroid, depression.   TEST/EVENTS:  2003 PSG: AHI 57  07/30/2022: OV with Jessey Stehlin NP for sleep consult and to re-establish care for management of her OSA. She has been on CPAP for many years. Wears it nightly. Doesn't think she could sleep without it. She wakes feeling rested. No significant daytime fatigue. Denies any morning headaches or drowsy driving. Her machine gave her a motor life warning. It is well over 35 years old. [employer] told her she needed a new appointment to get a new machine. Wears a full face mask. No trouble with leaks. Machine is set at 14-20 cmH2O.  She goes to bed between 9-8 pm. Falls asleep quickly. Wakes 2-3 times to use the bathroom. Gets up around 8 am. She does sleep long periods, usually about 10 hours. She takes Abilify and occasionally a xanax before bed; no other sleep aids. She is not employed. Last sleep study was sometime around 2003. She has a history of hypertension, well controlled on antihypertensives. No hx of stroke, DM, or cardiac arrhythmia.  She is a never smoker. Doesn't drink alcohol. No excessive caffeine intake. Lives with her husband. Family history of allergies, asthma, Reyes. [place] 7  10/27/2022: OV with Makaylee Spielberg NP for follow up after receiving new CPAP machine. She is doing well on this for the most part. She feels like her bottom number may need to be brought up a little bit. Feels better with the higher pressures. She's sleeping well at night. She wakes feeling rested. Does have some daytime tiredness that is unchanged but doesn't impact her daily life. Denies morning headaches, drowsy driving or sleep  parasomnias/paralysis. She is not having any significant leaks. 09/26/2022-10/25/2022: CPAP 14-20 cmH2O 30/30 days; 100% >4 hr; average use 11 hr 27 min Pressure 95th 19.8 Leaks 95th 11 AHI 4.7  04/29/2023: Today - follow up Patient presents today for follow-up.  She has been doing well with her CPAP.  Wears it nightly.  She had previously felt like her pressure setting could be adjusted up a little bit.  Looks like this was never completed by the DME.  She does not want to make any changes at this time.  Feels like energy levels are at her baseline.  Sleep does feel refreshing.  She does feel like she sleeps longer than she should which sometimes make her a little more tired during the day.  Does not impact her daily life.  Denies any drowsy driving, sleep parasomnia/paralysis.  No significant leaks.  03/30/2023-04/28/2023: CPAP 14-20 cmH2O 30/30 days; 100% >4 hr; average use 12 hours 4 minutes Pressure 95th 19.8 Leaks 95th 3.2 AHI 6.5  Allergies  Allergen Reactions   Adhesive [Tape] Rash   Benzoyl Peroxide Rash    Benzine    Milk-Related Compounds Rash    Immunization History  Administered Date(s) Administered   Influenza, Seasonal, Injecte, Preservative Fre 01/04/2023   Influenza,inj,Quad PF,6+ Mos 03/30/2014, 01/16/2015, 02/14/2019, 01/10/2020, 12/11/2020, 01/14/2022   PFIZER(Purple Top)SARS-COV-2 Vaccination 06/03/2019, 07/04/2019, 12/29/2019   Respiratory Syncytial Virus Vaccine,Recomb Aduvanted(Arexvy) 02/17/2023   Tdap 05/06/2018   Zoster Recombinant(Shingrix) 07/09/2020, 10/15/2020    Past Medical History:  Diagnosis Date   Allergy  Anxiety    Arthritis    Blood in stool    Colon polyp    Depression    Eczema    Frequent headaches    GERD (gastroesophageal reflux disease)    Hypertension    Hypothyroidism    Migraine    OSA (obstructive sleep apnea)    CPAP nightly   Osteopenia    Sleep apnea    Thyroid disease    Trimalleolar fracture of ankle, closed,  left, sequela     Tobacco History: Social History   Tobacco Use  Smoking Status Never  Smokeless Tobacco Never   Counseling given: Not Answered   Outpatient Medications Prior to Visit  Medication Sig Dispense Refill   ALPRAZolam (XANAX) 0.25 MG tablet Take 0.25 mg by mouth at bedtime as needed for anxiety or sleep.  0   ARIPiprazole (ABILIFY) 2 MG tablet Take 2 mg by mouth daily.     b complex vitamins capsule Take 1 capsule by mouth daily.     buPROPion (WELLBUTRIN XL) 300 MG 24 hr tablet Take 300 mg by mouth every morning.     calcium-vitamin D (OSCAL WITH D) 500-200 MG-UNIT tablet Take 1 tablet by mouth 3 (three) times daily. 90 tablet 12   cholecalciferol (VITAMIN D) 1000 units tablet Take 2,000 Units by mouth at bedtime.     cyclobenzaprine (FLEXERIL) 10 MG tablet Take 1 tablet (10 mg total) by mouth 3 (three) times daily as needed for muscle spasms. 90 tablet 1   desvenlafaxine (PRISTIQ) 100 MG 24 hr tablet Take 100 mg by mouth every morning.     famotidine-calcium carbonate-magnesium hydroxide (PEPCID COMPLETE) 10-800-165 MG chewable tablet Chew 1 tablet by mouth daily as needed.     fexofenadine (ALLEGRA) 180 MG tablet Take by mouth.     fluticasone (FLONASE) 50 MCG/ACT nasal spray Place into both nostrils daily.     ibuprofen (ADVIL,MOTRIN) 200 MG tablet Take 400 mg by mouth every 6 (six) hours as needed for headache, mild pain or moderate pain.     levothyroxine (SYNTHROID) 50 MCG tablet TAKE 1 TABLET BY MOUTH EVERY MORNING MONDAY-SATURDAY AND TAKE 2 TABLETS BY MOUTH ON SUNDAY 105 tablet 3   liothyronine (CYTOMEL) 5 MCG tablet TAKE ONE AND ONE-HALF TABLETS ONCE DAILY 135 tablet 3   lisinopril (ZESTRIL) 10 MG tablet TAKE 1 TABLET DAILY 90 tablet 1   meclizine (ANTIVERT) 25 MG tablet Take 1 tablet (25 mg total) by mouth every 4 (four) hours as needed for dizziness. 60 tablet 0   metoprolol tartrate (LOPRESSOR) 25 MG tablet TAKE 1 TABLET TWICE A DAY 180 tablet 1   Azelastine  HCl 0.15 % SOLN Place into the nose. (Patient not taking: Reported on 04/29/2023)     No facility-administered medications prior to visit.     Review of Systems:   Constitutional: No weight loss or gain, night sweats, fevers, chills, or lassitude. +occasional fatigue  HEENT: No headaches, difficulty swallowing, tooth/dental problems, or sore throat. No sneezing, itching, ear ache, nasal congestion, or post nasal drip CV:  No chest pain, orthopnea, PND, swelling in lower extremities, anasarca, dizziness, palpitations, syncope Resp: No shortness of breath with exertion or at rest. No excess mucus or change in color of mucus. No productive or non-productive. No hemoptysis. No wheezing.  No chest wall deformity GI:  No heartburn, indigestion GU: No dysuria, change in color of urine, urgency or frequency.   Skin: No rash, lesions, ulcerations MSK:  No joint pain or  swelling.   Neuro: No dizziness or lightheadedness.  Psych: No SI/HI. Mood stable.     Physical Exam:  BP 122/68 (BP Location: Right Arm, Patient Position: Sitting, Cuff Size: Normal)   Pulse 78   Ht 5\' 2"  (1.575 m)   Wt 261 lb 3.2 oz (118.5 kg)   SpO2 98%   BMI 47.77 kg/m   GEN: Pleasant, interactive, well-appearing; morbidly obese; in no acute distress. HEENT:  Normocephalic and atraumatic. PERRLA. Sclera white. Nasal turbinates pink, moist and patent bilaterally. No rhinorrhea present. Oropharynx pink and moist, without exudate or edema. No lesions, ulcerations, or postnasal drip. Mallampati IV NECK:  Supple w/ fair ROM. No JVD present. Normal carotid impulses w/o bruits. Thyroid symmetrical with no goiter or nodules palpated. No lymphadenopathy.   CV: RRR, no m/r/g, no peripheral edema. Pulses intact, +2 bilaterally. No cyanosis, pallor or clubbing. PULMONARY:  Unlabored, regular breathing. Clear bilaterally A&P w/o wheezes/rales/rhonchi. No accessory muscle use.  GI: BS present and normoactive. Soft, non-tender to  palpation. No organomegaly or masses detected.  MSK: No erythema, warmth or tenderness. Cap refil <2 sec all extrem. No deformities or joint swelling noted.  Neuro: A/Ox3. No focal deficits noted.   Skin: Warm, no lesions or rashe Psych: Normal affect and behavior. Judgement and thought content appropriate.     Lab Results:  CBC    Component Value Date/Time   WBC 3.6 (L) 12/22/2022 1026   RBC 4.81 12/22/2022 1026   HGB 14.2 12/22/2022 1026   HCT 41.7 12/22/2022 1026   PLT 251 12/22/2022 1026   MCV 86.7 12/22/2022 1026   MCH 29.5 12/22/2022 1026   MCHC 34.1 12/22/2022 1026   RDW 12.8 12/22/2022 1026   LYMPHSABS 1.3 07/16/2022 1028   MONOABS 0.4 07/16/2022 1028   EOSABS 0.1 07/16/2022 1028   BASOSABS 0.0 07/16/2022 1028    BMET    Component Value Date/Time   NA 138 12/22/2022 1026   K 3.9 12/22/2022 1026   CL 101 12/22/2022 1026   CO2 29 12/22/2022 1026   GLUCOSE 129 (H) 12/22/2022 1026   BUN 12 12/22/2022 1026   CREATININE 0.97 12/22/2022 1026   CREATININE 0.92 01/10/2020 1413   CALCIUM 9.1 12/22/2022 1026   GFRNONAA >60 12/22/2022 1026   GFRAA >60 02/11/2016 1400    BNP No results found for: "BNP"   Imaging:  No results found.  Administration History     None           No data to display          No results found for: "NITRICOXIDE"      Assessment & Plan:   OSA (obstructive sleep apnea) Severe OSA on CPAP.  Excellent compliance and control.  Residual AHI seems to fluctuate between 4-6 based on previous review.  She does receive benefit from use.  Aware of proper care/use of device.  Understands risks of untreated sleep apnea.  Encouraged her to work on sleep hygiene measures.  Healthy weight loss encouraged as well.  Safe during practices reviewed.  Patient Instructions  Continue to use CPAP every night, minimum of 4-6 hours a night.  Change equipment as directed. Wash your tubing with warm soap and water daily, hang to dry. Wash  humidifier portion weekly. Use bottled, distilled water and change daily Be aware of reduced alertness and do not drive or operate heavy machinery if experiencing this or drowsiness.  Exercise encouraged, as tolerated. Healthy weight management discussed.  Avoid or decrease alcohol  consumption and medications that make you more sleepy, if possible. Notify if persistent daytime sleepiness occurs even with consistent use of PAP therapy.   Follow up in one year with Katie Toluwani Ruder,NP or sooner, if needed     I spent 25 minutes of dedicated to the care of this patient on the date of this encounter to include pre-visit review of records, face-to-face time with the patient discussing conditions above, post visit ordering of testing, clinical documentation with the electronic health record, making appropriate referrals as documented, and communicating necessary findings to members of the patients care team.  Noemi Chapel, NP 04/29/2023  Pt aware and understands NP's role.

## 2023-04-30 ENCOUNTER — Other Ambulatory Visit: Payer: Self-pay

## 2023-04-30 MED ORDER — LEVOTHYROXINE SODIUM 50 MCG PO TABS: ORAL_TABLET | ORAL | 3 refills | Status: DC

## 2023-05-05 ENCOUNTER — Other Ambulatory Visit: Payer: Self-pay | Admitting: Internal Medicine

## 2023-05-05 DIAGNOSIS — I10 Essential (primary) hypertension: Secondary | ICD-10-CM

## 2023-05-06 ENCOUNTER — Telehealth: Payer: Self-pay | Admitting: Internal Medicine

## 2023-05-06 MED ORDER — LEVOTHYROXINE SODIUM 50 MCG PO TABS: ORAL_TABLET | ORAL | 1 refills | Status: DC

## 2023-05-06 NOTE — Telephone Encounter (Signed)
 Patient is calling to say that the pharmacy,   [facility] - [name], [place] - [address] (Ph: [phone])  Has received the refill request from this office but that they are telling her that they need to speak with someone in the office before they will fill the prescription. Patient states that she is not out of the medication (levothyroxine  levothyroxine  (SYNTHROID ) 50 MCG tablet), but she is starting to run low.

## 2023-05-06 NOTE — Telephone Encounter (Signed)
 Requested Prescriptions   Signed Prescriptions Disp Refills   levothyroxine (SYNTHROID) 50 MCG tablet 105 tablet 1    Sig: TAKE 1 TABLET BY MOUTH EVERY MORNING MONDAY-SATURDAY AND TAKE 2 TABLETS BY MOUTH ON SUNDAY    Authorizing Provider: Carlus Pavlov    Ordering User: Pollie Meyer

## 2023-05-12 ENCOUNTER — Other Ambulatory Visit: Payer: Self-pay | Admitting: Internal Medicine

## 2023-05-12 DIAGNOSIS — I10 Essential (primary) hypertension: Secondary | ICD-10-CM

## 2023-05-13 ENCOUNTER — Other Ambulatory Visit: Payer: Self-pay | Admitting: Internal Medicine

## 2023-05-27 DIAGNOSIS — F419 Anxiety disorder, unspecified: Secondary | ICD-10-CM | POA: Diagnosis not present

## 2023-05-27 DIAGNOSIS — F3341 Major depressive disorder, recurrent, in partial remission: Secondary | ICD-10-CM | POA: Diagnosis not present

## 2023-05-28 ENCOUNTER — Telehealth: Payer: Self-pay | Admitting: Internal Medicine

## 2023-05-28 NOTE — Telephone Encounter (Signed)
 Patient is requesting that her RX for Levothyroxine 50 MCG be switched from  [facility] to [facility] [address] [facility] [address]. Is requesting 90 day RX with refills if appropriate

## 2023-05-28 NOTE — Telephone Encounter (Signed)
Error

## 2023-05-31 MED ORDER — LEVOTHYROXINE SODIUM 50 MCG PO TABS: ORAL_TABLET | ORAL | 1 refills | Status: DC

## 2023-05-31 NOTE — Telephone Encounter (Signed)
 Requested Prescriptions   Signed Prescriptions Disp Refills   levothyroxine (SYNTHROID) 50 MCG tablet 105 tablet 1    Sig: TAKE 1 TABLET BY MOUTH EVERY MORNING MONDAY-SATURDAY AND TAKE 2 TABLETS BY MOUTH ON SUNDAY    Authorizing Provider: Carlus Pavlov    Ordering User: Pollie Meyer

## 2023-06-01 ENCOUNTER — Telehealth: Payer: Self-pay

## 2023-06-01 NOTE — Telephone Encounter (Signed)
 Copied from CRM [id number]. Topic: Clinical - Medical Advice >> Jun 01, 2023  3:23 PM Kathryne Eriksson wrote: Reason for CRM: Requesting Medical Advice >> Jun 01, 2023  3:26 PM Kathryne Eriksson wrote: Patient is requesting a call back from Philip Aspen, Limmie Patricia, MD in regards to some medical advice. Patient states the blood pressure medication she's been taking isn't really working as her blood pressure is still being elevated. Patient wants to know what she should do next. Patient call back number [phone]

## 2023-06-01 NOTE — Telephone Encounter (Signed)
 Appointment scheduled.

## 2023-06-01 NOTE — Telephone Encounter (Signed)
 Spoke to patient and her blood pressure readings have been averaging at 175/88.  Please advise

## 2023-06-03 ENCOUNTER — Encounter: Payer: Self-pay | Admitting: Internal Medicine

## 2023-06-03 ENCOUNTER — Ambulatory Visit (INDEPENDENT_AMBULATORY_CARE_PROVIDER_SITE_OTHER): Admitting: Internal Medicine

## 2023-06-03 VITALS — BP 146/74 | HR 62 | Temp 98.4°F | Wt 262.9 lb

## 2023-06-03 DIAGNOSIS — I10 Essential (primary) hypertension: Secondary | ICD-10-CM

## 2023-06-03 MED ORDER — AMLODIPINE BESYLATE 5 MG PO TABS
5.0000 mg | ORAL_TABLET | Freq: Every day | ORAL | 1 refills | Status: DC
Start: 2023-06-03 — End: 2023-10-28

## 2023-06-03 NOTE — Progress Notes (Signed)
 Established Patient Office Visit     CC/Reason for Visit: Follow-up blood pressure  HPI: Mary Reyes is a 63 y.o. female who is coming in today for the above mentioned reasons.  At last visit her blood pressure was noted to be elevated.  She was asked to do ambulatory measurements and return today.  Her average home blood pressure has been 170/90.  In office today 146/78.  She is feeling well without concerns or complaints.   Past Medical/Surgical History: Past Medical History:  Diagnosis Date   Allergy    Anxiety    Arthritis    Blood in stool    Colon polyp    Depression    Eczema    Frequent headaches    GERD (gastroesophageal reflux disease)    Hypertension    Hypothyroidism    Migraine    OSA (obstructive sleep apnea)    CPAP nightly   Osteopenia    Sleep apnea    Thyroid disease    Trimalleolar fracture of ankle, closed, left, sequela     Past Surgical History:  Procedure Laterality Date   ABDOMINAL HYSTERECTOMY  2004   ORIF ANKLE FRACTURE Left 02/12/2016   Procedure: OPEN REDUCTION INTERNAL FIXATION (ORIF) LEFT ANKLE FRACTURE;  Surgeon: Tarry Kos, MD;  Location: [facility];  Service: Orthopedics;  Laterality: Left;   WISDOM TOOTH EXTRACTION      Social History:  reports that she has never smoked. She has never used smokeless tobacco. She reports that she does not drink alcohol and does not use drugs.  Allergies: Allergies  Allergen Reactions   Adhesive [Tape] Rash   Benzoyl Peroxide Rash    Benzine    Milk-Related Compounds Rash    Family History:  Family History  Problem Relation Age of Onset   Healthy Mother    Alcohol abuse Father        Deceased   Brain cancer Sister    Asthma Sister    Stroke Maternal Grandmother    Arthritis Paternal Grandmother    Depression Daughter    Heart murmur Daughter    Allergic rhinitis Daughter    Fibromyalgia Daughter    Allergic rhinitis Daughter    Migraines Son    Allergic  rhinitis Son    Angioedema Neg Hx    Eczema Neg Hx    Immunodeficiency Neg Hx    Urticaria Neg Hx    Colon cancer Neg Hx    Esophageal cancer Neg Hx    Stomach cancer Neg Hx    Rectal cancer Neg Hx      Current Outpatient Medications:    ALPRAZolam (XANAX) 0.25 MG tablet, Take 0.25 mg by mouth at bedtime as needed for anxiety or sleep., Disp: , Rfl: 0   amLODipine (NORVASC) 5 MG tablet, Take 1 tablet (5 mg total) by mouth daily., Disp: 90 tablet, Rfl: 1   ARIPiprazole (ABILIFY) 2 MG tablet, Take 2 mg by mouth daily., Disp: , Rfl:    Azelastine HCl 0.15 % SOLN, Place into the nose., Disp: , Rfl:    b complex vitamins capsule, Take 1 capsule by mouth daily., Disp: , Rfl:    buPROPion (WELLBUTRIN XL) 300 MG 24 hr tablet, Take 300 mg by mouth every morning., Disp: , Rfl:    calcium-vitamin D (OSCAL WITH D) 500-200 MG-UNIT tablet, Take 1 tablet by mouth 3 (three) times daily., Disp: 90 tablet, Rfl: 12   cholecalciferol (VITAMIN D) 1000 units  tablet, Take 2,000 Units by mouth at bedtime., Disp: , Rfl:    cyclobenzaprine (FLEXERIL) 10 MG tablet, Take 1 tablet (10 mg total) by mouth 3 (three) times daily as needed for muscle spasms., Disp: 90 tablet, Rfl: 1   desvenlafaxine (PRISTIQ) 100 MG 24 hr tablet, Take 100 mg by mouth every morning., Disp: , Rfl:    famotidine-calcium carbonate-magnesium hydroxide (PEPCID COMPLETE) 10-800-165 MG chewable tablet, Chew 1 tablet by mouth daily as needed., Disp: , Rfl:    fexofenadine (ALLEGRA) 180 MG tablet, Take by mouth., Disp: , Rfl:    fluticasone (FLONASE) 50 MCG/ACT nasal spray, Place into both nostrils daily., Disp: , Rfl:    ibuprofen (ADVIL,MOTRIN) 200 MG tablet, Take 400 mg by mouth every 6 (six) hours as needed for headache, mild pain or moderate pain., Disp: , Rfl:    levothyroxine (SYNTHROID) 50 MCG tablet, TAKE 1 TABLET BY MOUTH EVERY MORNING MONDAY-SATURDAY AND TAKE 2 TABLETS BY MOUTH ON SUNDAY, Disp: 105 tablet, Rfl: 1   liothyronine (CYTOMEL)  5 MCG tablet, TAKE 1 AND 1/2 TABLETS BY MOUTH ONCE DAILY, Disp: 135 tablet, Rfl: 3   meclizine (ANTIVERT) 25 MG tablet, Take 1 tablet (25 mg total) by mouth every 4 (four) hours as needed for dizziness., Disp: 60 tablet, Rfl: 0  Review of Systems:  Negative unless indicated in HPI.   Physical Exam: Vitals:   06/03/23 1532 06/03/23 1536  BP: (!) 140/78 (!) 146/74  Pulse: 62   Temp: 98.4 F (36.9 C)   TempSrc: Oral   SpO2: 97%   Weight: 262 lb 14.4 oz (119.3 kg)     Body mass index is 48.09 kg/m.   Physical Exam Vitals reviewed.  Constitutional:      Appearance: Normal appearance. She is obese.  HENT:     Head: Normocephalic and atraumatic.  Cardiovascular:     Rate and Rhythm: Normal rate and regular rhythm.  Pulmonary:     Effort: Pulmonary effort is normal.     Breath sounds: Normal breath sounds.  Skin:    General: Skin is warm and dry.  Neurological:     General: No focal deficit present.     Mental Status: She is alert and oriented to person, place, and time.  Psychiatric:        Mood and Affect: Mood normal.        Behavior: Behavior normal.        Thought Content: Thought content normal.        Judgment: Judgment normal.      Impression and Plan:  Essential hypertension -     amLODIPine Besylate; Take 1 tablet (5 mg total) by mouth daily.  Dispense: 90 tablet; Refill: 1   -Discontinue lisinopril and metoprolol. -Start amlodipine 5 mg daily, continue ambulatory blood pressure measurements and return in 6 to 8 weeks for follow-up.  Time spent:30 minutes reviewing chart, interviewing and examining patient and formulating plan of care.     Chaya Jan, MD [facility]

## 2023-06-07 ENCOUNTER — Encounter: Payer: Self-pay | Admitting: Family Medicine

## 2023-06-07 ENCOUNTER — Ambulatory Visit: Payer: Self-pay | Admitting: Internal Medicine

## 2023-06-07 ENCOUNTER — Ambulatory Visit (INDEPENDENT_AMBULATORY_CARE_PROVIDER_SITE_OTHER): Admitting: Family Medicine

## 2023-06-07 VITALS — BP 140/78 | HR 80 | Temp 97.8°F | Resp 16 | Ht 62.0 in | Wt 265.0 lb

## 2023-06-07 DIAGNOSIS — E039 Hypothyroidism, unspecified: Secondary | ICD-10-CM | POA: Diagnosis not present

## 2023-06-07 DIAGNOSIS — R002 Palpitations: Secondary | ICD-10-CM

## 2023-06-07 DIAGNOSIS — I10 Essential (primary) hypertension: Secondary | ICD-10-CM

## 2023-06-07 LAB — BASIC METABOLIC PANEL
BUN: 10 mg/dL (ref 6–23)
CO2: 28 meq/L (ref 19–32)
Calcium: 9.3 mg/dL (ref 8.4–10.5)
Chloride: 103 meq/L (ref 96–112)
Creatinine, Ser: 0.86 mg/dL (ref 0.40–1.20)
GFR: 72.26 mL/min (ref >60.00)
Glucose, Bld: 107 mg/dL — ABNORMAL HIGH (ref 70–99)
Potassium: 3.7 meq/L (ref 3.5–5.1)
Sodium: 140 meq/L (ref 135–145)

## 2023-06-07 LAB — CBC
HCT: 41.7 % (ref 36.0–46.0)
Hemoglobin: 14.1 g/dL (ref 12.0–15.0)
MCHC: 33.9 g/dL (ref 30.0–36.0)
MCV: 89.6 fl (ref 78.0–100.0)
Platelets: 265 10*3/uL (ref 150.0–400.0)
RBC: 4.66 Mil/uL (ref 3.87–5.11)
RDW: 13.8 % (ref 11.5–15.5)
WBC: 5.4 10*3/uL (ref 4.0–10.5)

## 2023-06-07 LAB — TSH: TSH: 2.09 u[IU]/mL (ref 0.35–5.50)

## 2023-06-07 MED ORDER — OLMESARTAN MEDOXOMIL 20 MG PO TABS
20.0000 mg | ORAL_TABLET | Freq: Every day | ORAL | 1 refills | Status: DC
Start: 2023-06-07 — End: 2023-07-22

## 2023-06-07 NOTE — Progress Notes (Signed)
 ACUTE VISIT Chief Complaint  Patient presents with   Medication Reaction    Started Amlodipine on Saturday evening, palpitations started after that.    HPI: [name] is a 62 y.o. female with a PMHx significant for HTN, OSA, GERD, hypothyroidism, vitamin D deficiency, and depression, among others, who is here today complaining of palpitations.   Palpitations:  Patient complains of constant palpitations since starting amlodipine 5 mg on 3/6. She has taken the medication two times.  Palpitations  This is a new problem. The current episode started in the past 7 days. The problem occurs constantly. The problem has been unchanged. Nothing aggravates the symptoms. Pertinent negatives include no chest fullness, diaphoresis, dizziness, fever, irregular heartbeat, malaise/fatigue, near-syncope, numbness, syncope, vomiting or weakness. She has tried nothing for the symptoms. Her past medical history is significant for anxiety and hyperthyroidism.   She has felt palpitations both in her chest and her head. The palpitations have a regular rhythm.  She has been checking her BP at home. Most recently it was 173/95. In general, she says it is usually higher when she checks it at home. The last time she checked her HR it was 73.  In the past she had been on metoprolol and lisinopril, but they were not effective in lowering her BP.  Pertinent negatives include headache, chest pain, SOB, cough, nausea, or abdominal pain.   OSA: She is wearing her CPAP.  Denies orthopnea, PND, or edema.  Hypothyroidism on levothyroxine 50 mcg daily x 6 days and 100 mcg on Sundays. Follows with endocrinologist.  Lab Results  Component Value Date   TSH 1.08 07/16/2022   Lab Results  Component Value Date   NA 138 12/22/2022   CL 101 12/22/2022   K 3.9 12/22/2022   CO2 29 12/22/2022   BUN 12 12/22/2022   CREATININE 0.97 12/22/2022   GFRNONAA >60 12/22/2022   CALCIUM 9.1 12/22/2022   ALBUMIN 4.3  07/16/2022   GLUCOSE 129 (H) 12/22/2022   Review of Systems  Constitutional:  Negative for activity change, appetite change, diaphoresis, fever and malaise/fatigue.  HENT:  Negative for nosebleeds and sore throat.   Eyes:  Negative for redness and visual disturbance.  Respiratory:  Negative for wheezing.   Cardiovascular:  Positive for palpitations. Negative for syncope and near-syncope.  Gastrointestinal:  Negative for vomiting.       No changes in bowel habits.  Endocrine: Negative for cold intolerance and heat intolerance.  Genitourinary:  Negative for decreased urine volume, dysuria and hematuria.  Skin:  Negative for rash.  Neurological:  Negative for dizziness, weakness and numbness.  Psychiatric/Behavioral:  Negative for confusion and hallucinations.   See other pertinent positives and negatives in HPI.  Current Outpatient Medications on File Prior to Visit  Medication Sig Dispense Refill   ALPRAZolam (XANAX) 0.25 MG tablet Take 0.25 mg by mouth at bedtime as needed for anxiety or sleep.  0   amLODipine (NORVASC) 5 MG tablet Take 1 tablet (5 mg total) by mouth daily. 90 tablet 1   ARIPiprazole (ABILIFY) 2 MG tablet Take 2 mg by mouth daily.     Azelastine HCl 0.15 % SOLN Place into the nose.     b complex vitamins capsule Take 1 capsule by mouth daily.     buPROPion (WELLBUTRIN XL) 300 MG 24 hr tablet Take 300 mg by mouth every morning.     calcium-vitamin D (OSCAL WITH D) 500-200 MG-UNIT tablet Take 1 tablet by mouth 3 (three)  times daily. 90 tablet 12   cholecalciferol (VITAMIN D) 1000 units tablet Take 2,000 Units by mouth at bedtime.     cyclobenzaprine (FLEXERIL) 10 MG tablet Take 1 tablet (10 mg total) by mouth 3 (three) times daily as needed for muscle spasms. 90 tablet 1   desvenlafaxine (PRISTIQ) 100 MG 24 hr tablet Take 100 mg by mouth every morning.     famotidine-calcium carbonate-magnesium hydroxide (PEPCID COMPLETE) 10-800-165 MG chewable tablet Chew 1 tablet by  mouth daily as needed.     fexofenadine (ALLEGRA) 180 MG tablet Take by mouth.     fluticasone (FLONASE) 50 MCG/ACT nasal spray Place into both nostrils daily.     ibuprofen (ADVIL,MOTRIN) 200 MG tablet Take 400 mg by mouth every 6 (six) hours as needed for headache, mild pain or moderate pain.     levothyroxine (SYNTHROID) 50 MCG tablet TAKE 1 TABLET BY MOUTH EVERY MORNING MONDAY-SATURDAY AND TAKE 2 TABLETS BY MOUTH ON SUNDAY 105 tablet 1   liothyronine (CYTOMEL) 5 MCG tablet TAKE 1 AND 1/2 TABLETS BY MOUTH ONCE DAILY 135 tablet 3   meclizine (ANTIVERT) 25 MG tablet Take 1 tablet (25 mg total) by mouth every 4 (four) hours as needed for dizziness. 60 tablet 0   No current facility-administered medications on file prior to visit.   Past Medical History:  Diagnosis Date   Allergy    Anxiety    Arthritis    Blood in stool    Colon polyp    Depression    Eczema    Frequent headaches    GERD (gastroesophageal reflux disease)    Hypertension    Hypothyroidism    Migraine    OSA (obstructive sleep apnea)    CPAP nightly   Osteopenia    Sleep apnea    Thyroid disease    Trimalleolar fracture of ankle, closed, left, sequela    Allergies  Allergen Reactions   Adhesive [Tape] Rash   Benzoyl Peroxide Rash    Benzine    Milk-Related Compounds Rash    Social History   Socioeconomic History   Marital status: Married    Spouse name: Not on file   Number of children: 3   Years of education: Not on file   Highest education level: 12th grade  Occupational History   Occupation: [employer]  Tobacco Use   Smoking status: Never   Smokeless tobacco: Never  Vaping Use   Vaping status: Never Used  Substance and Sexual Activity   Alcohol use: No    Alcohol/week: 0.0 standard drinks of alcohol   Drug use: No   Sexual activity: Not on file  Other Topics Concern   Not on file  Social History Narrative   She lives with husband.  They have three grown children.   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    She is not on disability.     Highest level of education:  Some college   Social Drivers of Health   Financial Resource Strain: Low Risk  (10/19/2022)   Overall Financial Resource Strain (CARDIA)    Difficulty of Paying Living Expenses: Not hard at all  Food Insecurity: No Food Insecurity (10/19/2022)   Hunger Vital Sign    Worried About Running Out of Food in the Last Year: Never true    Ran Out of Food in the Last Year: Never true  Transportation Needs: No Transportation Needs (10/19/2022)   PRAPARE - Transportation  Lack of Transportation (Medical): No    Lack of Transportation (Non-Medical): No  Physical Activity: Unknown (10/19/2022)   Exercise Vital Sign    Days of Exercise per Week: 0 days    Minutes of Exercise per Session: Not on file  Stress: No Stress Concern Present (10/19/2022)   [employer] of Occupational Health - Occupational Stress Questionnaire    Feeling of Stress : Not at all  Social Connections: Socially Integrated (10/19/2022)   Social Connection and Isolation Panel [NHANES]    Frequency of Communication with Friends and Family: More than three times a week    Frequency of Social Gatherings with Friends and Family: More than three times a week    Attends Religious Services: More than 4 times per year    Active Member of [employer] or Organizations: Yes    Attends Banker Meetings: More than 4 times per year    Marital Status: Married    Vitals:   06/07/23 1049  BP: (!) 140/78  Pulse: 80  Resp: 16  Temp: 97.8 F (36.6 C)  SpO2: 98%   Body mass index is 48.47 kg/m.  Physical Exam Vitals and nursing note reviewed.  Constitutional:      General: She is not in acute distress.    Appearance: She is well-developed.  HENT:     Head: Normocephalic and atraumatic.     Mouth/Throat:     Mouth: Mucous membranes are moist.     Pharynx: Oropharynx is clear. Uvula midline.  Eyes:      Conjunctiva/sclera: Conjunctivae normal.  Neck:     Vascular: No carotid bruit.  Cardiovascular:     Rate and Rhythm: Normal rate and regular rhythm.     Heart sounds: Murmur (RUSB SEM I/VI) heard.  Pulmonary:     Effort: Pulmonary effort is normal. No respiratory distress.     Breath sounds: Normal breath sounds.  Abdominal:     Palpations: Abdomen is soft. There is no mass.     Tenderness: There is no abdominal tenderness.  Lymphadenopathy:     Cervical: No cervical adenopathy.  Skin:    General: Skin is warm.     Findings: No erythema or rash.  Neurological:     General: No focal deficit present.     Mental Status: She is alert and oriented to person, place, and time.     Cranial Nerves: No cranial nerve deficit.     Gait: Gait normal.  Psychiatric:        Mood and Affect: Mood and affect normal.   ASSESSMENT AND PLAN:  Ms. Couse was seen today for palpitations.  Lab Results  Component Value Date   NA 140 06/07/2023   CL 103 06/07/2023   K 3.7 06/07/2023   CO2 28 06/07/2023   BUN 10 06/07/2023   CREATININE 0.86 06/07/2023   GFR 72.26 06/07/2023   CALCIUM 9.3 06/07/2023   ALBUMIN 4.3 07/16/2022   GLUCOSE 107 (H) 06/07/2023   Lab Results  Component Value Date   WBC 5.4 06/07/2023   HGB 14.1 06/07/2023   HCT 41.7 06/07/2023   MCV 89.6 06/07/2023   PLT 265.0 06/07/2023   Lab Results  Component Value Date   TSH 2.09 06/07/2023   Palpitations We discussed possible etiologies, started after taking amlodipine, so today we discontinue medication. Continue monitoring HR. EKG today:SR, normal axis and intervals.? IVCD, nonspecific ST and T wave abnormalities. When compared with EKG done in 12/18/2022 and 09/16/2020 there are no  significant abnormalities except for sinus bradycardia seen in 2024 is not longer present. I do not think she needs further cardiac workup at this time. She was clearly instructed about warning signs. Follow-up with PCP in 4 to 5 weeks, before  if needed.  She already has a follow-up appointment scheduled.  -     CBC; Future -     Basic metabolic panel; Future -     TSH; Future -     EKG 12-Lead  Essential hypertension Prior EKG sinus bradycardia. She reports that in the past diuretics caused hypokalemia. We discussed a few options, she agrees with switching amlodipine to olmesartan 20 mg, she can start 1/2 tablet daily and increase to 1 tablet in a week if well-tolerated. Continue monitoring BP regularly. Follow-up with PCP in 4-5 weeks, before if needed.  -     EKG 12-Lead -     Olmesartan Medoxomil; Take 1 tablet (20 mg total) by mouth daily.  Dispense: 30 tablet; Refill: 1  Acquired hypothyroidism Problem has been adequately controlled, last TSH in 06/2022 was 1.08. Following with endocrinologist.  -     TSH; Future  Return in about 5 weeks (around 07/12/2023) for HTN and palpitations with PCP.  I, Rolla Etienne Wierda, acting as a scribe for Loveah Like [place], MD., have documented all relevant documentation on the behalf of Charlsie Fleeger [place], MD, as directed by  Jaxiel Kines [place], MD while in the presence of Haizel Gatchell [place], MD.   I, Baer Hinton [place], MD, have reviewed all documentation for this visit. The documentation on 06/07/23 for the exam, diagnosis, procedures, and orders are all accurate and complete.  Helana Macbride G. [place], MD  [facility]. [place] office.

## 2023-06-07 NOTE — Patient Instructions (Addendum)
 A few things to remember from today's visit:  Palpitations - Plan: CBC, Basic metabolic panel, TSH, EKG 12-Lead  Essential hypertension - Plan: EKG 12-Lead, olmesartan (BENICAR) 20 MG tablet  Acquired hypothyroidism - Plan: TSH Stop Amlodipine. Monitor for new symptoms. Start olmesartan, initially you can try 1/2 tablet daily and increase to 1 tablet in 1 week if well-tolerated. Continue monitoring blood pressure regularly. Please arrange a 4-5 weeks follow-up appointment with PCP. If you need refills for medications you take chronically, please call your pharmacy. Do not use My Chart to request refills or for acute issues that need immediate attention. If you send a my chart message, it may take a few days to be addressed, specially if I am not in the office.  Please be sure medication list is accurate. If a new problem present, please set up appointment sooner than planned today.

## 2023-06-07 NOTE — Telephone Encounter (Signed)
   Chief Complaint: heart pounding Symptoms: forceful heart beat  Frequency: constant   Disposition: [] ED /[] Urgent Care (no appt availability in office) / [x] Appointment(In office/virtual)/ []  [facility]/ [] Home Care/ [] Refused Recommended Disposition /[]  Mobile Bus/ []  Follow-up with PCP Additional Notes: pt complaining of "heart pounding" since starting Amlodipine 5mg  on Saturday. Pt states heart rate has been 85-95 bpm. Pt feels it is regular and hasn't noticed any palpitations.  Pt denies chest pain/heaviness and SOB. Pt stated she had this feeling before when her blood pressure medication was adjusted and potassium was low. Pt has appt today at noon with Dr [place] to address concerns. RN gave care advice and pt verbalized understanding.          Reason for Disposition  Age > 60 years  (Exception: Brief heartbeat symptoms that went away and now feels well.)  Answer Assessment - Initial Assessment Questions 1. DESCRIPTION: "Please describe your heart rate or heartbeat that you are having" (e.g., fast/slow, regular/irregular, skipped or extra beats, "palpitations")     Fast/pounding  2. ONSET: "When did it start?" (Minutes, hours or days)      Saturday  3. DURATION: "How long does it last" (e.g., seconds, minutes, hours)     Stays that way  4. PATTERN "Does it come and go, or has it been constant since it started?"  "Does it get worse with exertion?"   "Are you feeling it now?"     Constant   6. HEART RATE: "Can you tell me your heart rate?" "How many beats in 15 seconds?"  (Note: not all patients can do this)       85 7. RECURRENT SYMPTOM: "Have you ever had this before?" If Yes, ask: "When was the last time?" and "What happened that time?"      Yes- when changed to different bp med 8. CAUSE: "What do you think is causing the palpitations?"     Norvasc 9. CARDIAC HISTORY: "Do you have any history of heart disease?" (e.g., heart attack, angina, bypass  surgery, angioplasty, arrhythmia)      Denies  10. OTHER SYMPTOMS: "Do you have any other symptoms?" (e.g., dizziness, chest pain, sweating, difficulty breathing)       Denies all  Protocols used: Heart Rate and Heartbeat Questions-A-AH

## 2023-06-07 NOTE — Telephone Encounter (Signed)
 Copied from CRM [id number]. Topic: Clinical - Red Word Triage >> Jun 07, 2023  9:38 AM Elizebeth Brooking wrote: [facility] that prompted transfer to Nurse Triage: Patient called in believes she may be having a reaction to the new medication that she was put on amLODipine (NORVASC) 5 MG tablet , stated he chest is pounding really hard,

## 2023-06-28 DIAGNOSIS — G4733 Obstructive sleep apnea (adult) (pediatric): Secondary | ICD-10-CM | POA: Diagnosis not present

## 2023-07-22 ENCOUNTER — Encounter: Payer: Self-pay | Admitting: Internal Medicine

## 2023-07-22 ENCOUNTER — Ambulatory Visit: Admitting: Internal Medicine

## 2023-07-22 VITALS — BP 149/72 | HR 66 | Temp 98.2°F | Resp 16 | Ht 62.0 in | Wt 259.0 lb

## 2023-07-22 DIAGNOSIS — I10 Essential (primary) hypertension: Secondary | ICD-10-CM

## 2023-07-22 DIAGNOSIS — R002 Palpitations: Secondary | ICD-10-CM

## 2023-07-22 MED ORDER — OLMESARTAN MEDOXOMIL 40 MG PO TABS
40.0000 mg | ORAL_TABLET | Freq: Every day | ORAL | 1 refills | Status: DC
Start: 2023-07-22 — End: 2023-08-31

## 2023-07-22 MED ORDER — METOPROLOL TARTRATE 25 MG PO TABS
25.0000 mg | ORAL_TABLET | Freq: Two times a day (BID) | ORAL | 1 refills | Status: DC
Start: 2023-07-22 — End: 2023-08-31

## 2023-07-22 NOTE — Progress Notes (Signed)
 Established Patient Office Visit     CC/Reason for Visit: Follow-up blood pressure and palpitations  HPI: Mary Reyes is a 63 y.o. female who is coming in today for the above mentioned reasons.  In March she was taken off metoprolol  and placed on amlodipine  for blood pressure.  Shortly thereafter she started complaining of palpitations.  Saw another provider and was taken off amlodipine  and placed on olmesartan  20 mg.  She states that palpitations persist and blood pressure remains elevated at home.  Systolics average 170-180.  In office her blood pressure today is 149/72.   Past Medical/Surgical History: Past Medical History:  Diagnosis Date   Allergy    Anxiety    Arthritis    Blood in stool    Colon polyp    Depression    Eczema    Frequent headaches    GERD (gastroesophageal reflux disease)    Hypertension    Hypothyroidism    Migraine    OSA (obstructive sleep apnea)    CPAP nightly   Osteopenia    Sleep apnea    Thyroid  disease    Trimalleolar fracture of ankle, closed, left, sequela     Past Surgical History:  Procedure Laterality Date   ABDOMINAL HYSTERECTOMY  2004   ORIF ANKLE FRACTURE Left 02/12/2016   Procedure: OPEN REDUCTION INTERNAL FIXATION (ORIF) LEFT ANKLE FRACTURE;  Surgeon: Wes Hamman, MD;  Location: [facility];  Service: Orthopedics;  Laterality: Left;   WISDOM TOOTH EXTRACTION      Social History:  reports that she has never smoked. She has never used smokeless tobacco. She reports that she does not drink alcohol and does not use drugs.  Allergies: Allergies  Allergen Reactions   Adhesive [Tape] Rash   Benzoyl Peroxide Rash    Benzine    Milk-Related Compounds Rash    Family History:  Family History  Problem Relation Age of Onset   Healthy Mother    Alcohol abuse Father        Deceased   Brain cancer Sister    Asthma Sister    Stroke Maternal Grandmother    Arthritis Paternal Grandmother    Depression  Daughter    Heart murmur Daughter    Allergic rhinitis Daughter    Fibromyalgia Daughter    Allergic rhinitis Daughter    Migraines Son    Allergic rhinitis Son    Angioedema Neg Hx    Eczema Neg Hx    Immunodeficiency Neg Hx    Urticaria Neg Hx    Colon cancer Neg Hx    Esophageal cancer Neg Hx    Stomach cancer Neg Hx    Rectal cancer Neg Hx      Current Outpatient Medications:    ALPRAZolam  (XANAX ) 0.25 MG tablet, Take 0.25 mg by mouth at bedtime as needed for anxiety or sleep., Disp: , Rfl: 0   amLODipine  (NORVASC ) 5 MG tablet, Take 1 tablet (5 mg total) by mouth daily., Disp: 90 tablet, Rfl: 1   ARIPiprazole  (ABILIFY ) 2 MG tablet, Take 2 mg by mouth daily., Disp: , Rfl:    Azelastine  HCl 0.15 % SOLN, Place into the nose., Disp: , Rfl:    b complex vitamins capsule, Take 1 capsule by mouth daily., Disp: , Rfl:    buPROPion  (WELLBUTRIN  XL) 300 MG 24 hr tablet, Take 300 mg by mouth every morning., Disp: , Rfl:    calcium -vitamin D  (OSCAL WITH D) 500-200 MG-UNIT tablet, Take  1 tablet by mouth 3 (three) times daily., Disp: 90 tablet, Rfl: 12   cholecalciferol  (VITAMIN D ) 1000 units tablet, Take 2,000 Units by mouth at bedtime., Disp: , Rfl:    cyclobenzaprine  (FLEXERIL ) 10 MG tablet, Take 1 tablet (10 mg total) by mouth 3 (three) times daily as needed for muscle spasms., Disp: 90 tablet, Rfl: 1   desvenlafaxine (PRISTIQ) 100 MG 24 hr tablet, Take 100 mg by mouth every morning., Disp: , Rfl:    famotidine-calcium  carbonate-magnesium  hydroxide (PEPCID COMPLETE) 10-800-165 MG chewable tablet, Chew 1 tablet by mouth daily as needed., Disp: , Rfl:    fexofenadine (ALLEGRA) 180 MG tablet, Take by mouth., Disp: , Rfl:    fluticasone  (FLONASE ) 50 MCG/ACT nasal spray, Place into both nostrils daily., Disp: , Rfl:    ibuprofen  (ADVIL ,MOTRIN ) 200 MG tablet, Take 400 mg by mouth every 6 (six) hours as needed for headache, mild pain or moderate pain., Disp: , Rfl:    levothyroxine  (SYNTHROID ) 50  MCG tablet, TAKE 1 TABLET BY MOUTH EVERY MORNING MONDAY-SATURDAY AND TAKE 2 TABLETS BY MOUTH ON SUNDAY, Disp: 105 tablet, Rfl: 1   liothyronine  (CYTOMEL ) 5 MCG tablet, TAKE 1 AND 1/2 TABLETS BY MOUTH ONCE DAILY, Disp: 135 tablet, Rfl: 3   meclizine  (ANTIVERT ) 25 MG tablet, Take 1 tablet (25 mg total) by mouth every 4 (four) hours as needed for dizziness., Disp: 60 tablet, Rfl: 0   metoprolol  tartrate (LOPRESSOR ) 25 MG tablet, Take 1 tablet (25 mg total) by mouth 2 (two) times daily., Disp: 180 tablet, Rfl: 1   olmesartan  (BENICAR ) 40 MG tablet, Take 1 tablet (40 mg total) by mouth daily., Disp: 90 tablet, Rfl: 1  Review of Systems:  Negative unless indicated in HPI.   Physical Exam: Vitals:   07/22/23 1027  BP: (!) 149/72  Pulse: 66  Resp: 16  Temp: 98.2 F (36.8 C)  TempSrc: Oral  SpO2: 97%  Weight: 259 lb (117.5 kg)  Height: 5\' 2"  (1.575 m)    Body mass index is 47.37 kg/m.   Physical Exam Vitals reviewed.  Constitutional:      Appearance: Normal appearance. She is obese.  HENT:     Head: Normocephalic and atraumatic.  Eyes:     Conjunctiva/sclera: Conjunctivae normal.  Cardiovascular:     Rate and Rhythm: Normal rate and regular rhythm.  Pulmonary:     Effort: Pulmonary effort is normal.     Breath sounds: Normal breath sounds.  Skin:    General: Skin is warm and dry.  Neurological:     General: No focal deficit present.     Mental Status: She is alert and oriented to person, place, and time.  Psychiatric:        Mood and Affect: Mood normal.        Behavior: Behavior normal.        Thought Content: Thought content normal.        Judgment: Judgment normal.      Impression and Plan:  Essential hypertension -     Olmesartan  Medoxomil; Take 1 tablet (40 mg total) by mouth daily.  Dispense: 90 tablet; Refill: 1  Palpitations -     Metoprolol  Tartrate; Take 1 tablet (25 mg total) by mouth 2 (two) times daily.  Dispense: 180 tablet; Refill: 1   - I think  her palpitations are likely due to her coming off beta-blocker.  Will resume metoprolol  25 mg twice daily. - Since blood pressure is not well-controlled we will increase  olmesartan  to 40 mg daily.  She will continue to monitor blood pressure and follow-up in 6 to 8 weeks.  Time spent:32 minutes reviewing chart, interviewing and examining patient and formulating plan of care.     Marguerita Shih, MD [facility]

## 2023-07-31 ENCOUNTER — Other Ambulatory Visit: Payer: Self-pay | Admitting: Family Medicine

## 2023-07-31 DIAGNOSIS — I10 Essential (primary) hypertension: Secondary | ICD-10-CM

## 2023-08-31 ENCOUNTER — Ambulatory Visit (INDEPENDENT_AMBULATORY_CARE_PROVIDER_SITE_OTHER): Admitting: Internal Medicine

## 2023-08-31 ENCOUNTER — Encounter: Payer: Self-pay | Admitting: Internal Medicine

## 2023-08-31 VITALS — BP 164/86 | HR 53 | Temp 98.2°F | Wt 261.1 lb

## 2023-08-31 DIAGNOSIS — R002 Palpitations: Secondary | ICD-10-CM

## 2023-08-31 DIAGNOSIS — I10 Essential (primary) hypertension: Secondary | ICD-10-CM

## 2023-08-31 MED ORDER — METOPROLOL TARTRATE 25 MG PO TABS
12.5000 mg | ORAL_TABLET | Freq: Two times a day (BID) | ORAL | 1 refills | Status: AC
Start: 2023-08-31 — End: ?

## 2023-08-31 MED ORDER — OLMESARTAN MEDOXOMIL-HCTZ 40-25 MG PO TABS: 1.0000 | ORAL_TABLET | Freq: Every day | ORAL | 1 refills | Status: DC

## 2023-08-31 NOTE — Progress Notes (Signed)
 Established Patient Office Visit     CC/Reason for Visit: Blood pressure follow-up  HPI: Mary Reyes is a 63 y.o. female who is coming in today for the above mentioned reasons.  At last visit we increased olmesartan  from 20 to 40 mg due to uncontrolled blood pressure and added back metoprolol  25 mg twice daily due to palpitations.  Her blood pressure remains elevated in the 160-170 systolic range with diastolics in the 80s to 90s.  Her palpitations have improved, however she is feeling quite tired.   Past Medical/Surgical History: Past Medical History:  Diagnosis Date   Allergy    Anxiety    Arthritis    Blood in stool    Colon polyp    Depression    Eczema    Frequent headaches    GERD (gastroesophageal reflux disease)    Hypertension    Hypothyroidism    Migraine    OSA (obstructive sleep apnea)    CPAP nightly   Osteopenia    Sleep apnea    Thyroid  disease    Trimalleolar fracture of ankle, closed, left, sequela     Past Surgical History:  Procedure Laterality Date   ABDOMINAL HYSTERECTOMY  2004   ORIF ANKLE FRACTURE Left 02/12/2016   Procedure: OPEN REDUCTION INTERNAL FIXATION (ORIF) LEFT ANKLE FRACTURE;  Surgeon: Wes Hamman, MD;  Location: [facility];  Service: Orthopedics;  Laterality: Left;   WISDOM TOOTH EXTRACTION      Social History:  reports that she has never smoked. She has never used smokeless tobacco. She reports that she does not drink alcohol and does not use drugs.  Allergies: Allergies  Allergen Reactions   Adhesive [Tape] Rash   Benzoyl Peroxide Rash    Benzine    Milk-Related Compounds Rash    Family History:  Family History  Problem Relation Age of Onset   Healthy Mother    Alcohol abuse Father        Deceased   Brain cancer Sister    Asthma Sister    Stroke Maternal Grandmother    Arthritis Paternal Grandmother    Depression Daughter    Heart murmur Daughter    Allergic rhinitis Daughter     Fibromyalgia Daughter    Allergic rhinitis Daughter    Migraines Son    Allergic rhinitis Son    Angioedema Neg Hx    Eczema Neg Hx    Immunodeficiency Neg Hx    Urticaria Neg Hx    Colon cancer Neg Hx    Esophageal cancer Neg Hx    Stomach cancer Neg Hx    Rectal cancer Neg Hx      Current Outpatient Medications:    ALPRAZolam  (XANAX ) 0.25 MG tablet, Take 0.25 mg by mouth at bedtime as needed for anxiety or sleep., Disp: , Rfl: 0   amLODipine  (NORVASC ) 5 MG tablet, Take 1 tablet (5 mg total) by mouth daily., Disp: 90 tablet, Rfl: 1   ARIPiprazole  (ABILIFY ) 2 MG tablet, Take 2 mg by mouth daily., Disp: , Rfl:    Azelastine  HCl 0.15 % SOLN, Place into the nose., Disp: , Rfl:    b complex vitamins capsule, Take 1 capsule by mouth daily., Disp: , Rfl:    buPROPion  (WELLBUTRIN  XL) 300 MG 24 hr tablet, Take 300 mg by mouth every morning., Disp: , Rfl:    calcium -vitamin D  (OSCAL WITH D) 500-200 MG-UNIT tablet, Take 1 tablet by mouth 3 (three) times daily., Disp:  90 tablet, Rfl: 12   cholecalciferol  (VITAMIN D ) 1000 units tablet, Take 2,000 Units by mouth at bedtime., Disp: , Rfl:    cyclobenzaprine  (FLEXERIL ) 10 MG tablet, Take 1 tablet (10 mg total) by mouth 3 (three) times daily as needed for muscle spasms., Disp: 90 tablet, Rfl: 1   desvenlafaxine (PRISTIQ) 100 MG 24 hr tablet, Take 100 mg by mouth every morning., Disp: , Rfl:    famotidine-calcium  carbonate-magnesium  hydroxide (PEPCID COMPLETE) 10-800-165 MG chewable tablet, Chew 1 tablet by mouth daily as needed., Disp: , Rfl:    fexofenadine (ALLEGRA) 180 MG tablet, Take by mouth., Disp: , Rfl:    fluticasone  (FLONASE ) 50 MCG/ACT nasal spray, Place into both nostrils daily., Disp: , Rfl:    ibuprofen  (ADVIL ,MOTRIN ) 200 MG tablet, Take 400 mg by mouth every 6 (six) hours as needed for headache, mild pain or moderate pain., Disp: , Rfl:    levothyroxine  (SYNTHROID ) 50 MCG tablet, TAKE 1 TABLET BY MOUTH EVERY MORNING MONDAY-SATURDAY AND  TAKE 2 TABLETS BY MOUTH ON SUNDAY, Disp: 105 tablet, Rfl: 1   liothyronine  (CYTOMEL ) 5 MCG tablet, TAKE 1 AND 1/2 TABLETS BY MOUTH ONCE DAILY, Disp: 135 tablet, Rfl: 3   meclizine  (ANTIVERT ) 25 MG tablet, Take 1 tablet (25 mg total) by mouth every 4 (four) hours as needed for dizziness., Disp: 60 tablet, Rfl: 0   olmesartan -hydrochlorothiazide  (BENICAR  HCT) 40-25 MG tablet, Take 1 tablet by mouth daily., Disp: 90 tablet, Rfl: 1   metoprolol  tartrate (LOPRESSOR ) 25 MG tablet, Take 0.5 tablets (12.5 mg total) by mouth 2 (two) times daily., Disp: 180 tablet, Rfl: 1  Review of Systems:  Negative unless indicated in HPI.   Physical Exam: Vitals:   08/31/23 1047 08/31/23 1050  BP: (!) 140/78 (!) 164/86  Pulse: (!) 53   Temp: 98.2 F (36.8 C)   TempSrc: Oral   SpO2: 97%   Weight: 261 lb 1.6 oz (118.4 kg)     Body mass index is 47.76 kg/m.   Physical Exam Vitals reviewed.  Constitutional:      Appearance: Normal appearance. She is obese.  HENT:     Head: Normocephalic and atraumatic.  Eyes:     Conjunctiva/sclera: Conjunctivae normal.  Cardiovascular:     Rate and Rhythm: Normal rate and regular rhythm.  Pulmonary:     Effort: Pulmonary effort is normal.     Breath sounds: Normal breath sounds.  Skin:    General: Skin is warm and dry.  Neurological:     General: No focal deficit present.     Mental Status: She is alert and oriented to person, place, and time.  Psychiatric:        Mood and Affect: Mood normal.        Behavior: Behavior normal.        Thought Content: Thought content normal.        Judgment: Judgment normal.      Impression and Plan:  Essential hypertension -     Olmesartan  Medoxomil-HCTZ; Take 1 tablet by mouth daily.  Dispense: 90 tablet; Refill: 1  Palpitations -     Metoprolol  Tartrate; Take 0.5 tablets (12.5 mg total) by mouth 2 (two) times daily.  Dispense: 180 tablet; Refill: 1   - Decrease metoprolol  to 12.5 mg twice daily due to exercise  intolerance and fatigue. - Add hydrochlorothiazide  to olmesartan , follow-up in 6 to 8 weeks, check BMP to follow renal function and electrolytes.  Time spent:31 minutes reviewing chart, interviewing and examining  patient and formulating plan of care.     Marguerita Shih, MD [facility]

## 2023-09-02 ENCOUNTER — Ambulatory Visit: Admitting: Internal Medicine

## 2023-09-23 DIAGNOSIS — R419 Unspecified symptoms and signs involving cognitive functions and awareness: Secondary | ICD-10-CM | POA: Diagnosis not present

## 2023-09-23 DIAGNOSIS — F3341 Major depressive disorder, recurrent, in partial remission: Secondary | ICD-10-CM | POA: Diagnosis not present

## 2023-09-23 DIAGNOSIS — F419 Anxiety disorder, unspecified: Secondary | ICD-10-CM | POA: Diagnosis not present

## 2023-09-25 DIAGNOSIS — F3341 Major depressive disorder, recurrent, in partial remission: Secondary | ICD-10-CM | POA: Diagnosis not present

## 2023-09-25 DIAGNOSIS — F419 Anxiety disorder, unspecified: Secondary | ICD-10-CM | POA: Diagnosis not present

## 2023-09-28 DIAGNOSIS — G4733 Obstructive sleep apnea (adult) (pediatric): Secondary | ICD-10-CM | POA: Diagnosis not present

## 2023-10-07 DIAGNOSIS — G4733 Obstructive sleep apnea (adult) (pediatric): Secondary | ICD-10-CM | POA: Diagnosis not present

## 2023-10-21 ENCOUNTER — Encounter: Payer: BC Managed Care – PPO | Admitting: Internal Medicine

## 2023-10-22 DIAGNOSIS — F3341 Major depressive disorder, recurrent, in partial remission: Secondary | ICD-10-CM | POA: Diagnosis not present

## 2023-10-22 DIAGNOSIS — F419 Anxiety disorder, unspecified: Secondary | ICD-10-CM | POA: Diagnosis not present

## 2023-10-22 DIAGNOSIS — R419 Unspecified symptoms and signs involving cognitive functions and awareness: Secondary | ICD-10-CM | POA: Diagnosis not present

## 2023-10-25 DIAGNOSIS — F419 Anxiety disorder, unspecified: Secondary | ICD-10-CM | POA: Diagnosis not present

## 2023-10-25 DIAGNOSIS — F3341 Major depressive disorder, recurrent, in partial remission: Secondary | ICD-10-CM | POA: Diagnosis not present

## 2023-10-26 ENCOUNTER — Ambulatory Visit (INDEPENDENT_AMBULATORY_CARE_PROVIDER_SITE_OTHER): Admitting: Internal Medicine

## 2023-10-26 ENCOUNTER — Encounter: Payer: Self-pay | Admitting: Internal Medicine

## 2023-10-26 VITALS — BP 120/68 | HR 60 | Temp 98.1°F | Ht 63.0 in | Wt 261.7 lb

## 2023-10-26 DIAGNOSIS — E559 Vitamin D deficiency, unspecified: Secondary | ICD-10-CM | POA: Diagnosis not present

## 2023-10-26 DIAGNOSIS — I10 Essential (primary) hypertension: Secondary | ICD-10-CM | POA: Diagnosis not present

## 2023-10-26 DIAGNOSIS — E039 Hypothyroidism, unspecified: Secondary | ICD-10-CM

## 2023-10-26 DIAGNOSIS — Z0000 Encounter for general adult medical examination without abnormal findings: Secondary | ICD-10-CM | POA: Diagnosis not present

## 2023-10-26 DIAGNOSIS — R7302 Impaired glucose tolerance (oral): Secondary | ICD-10-CM

## 2023-10-26 DIAGNOSIS — Z23 Encounter for immunization: Secondary | ICD-10-CM

## 2023-10-26 LAB — COMPREHENSIVE METABOLIC PANEL WITH GFR
ALT: 41 U/L — ABNORMAL HIGH (ref 0–35)
AST: 26 U/L (ref 0–37)
Albumin: 4.2 g/dL (ref 3.5–5.2)
Alkaline Phosphatase: 62 U/L (ref 39–117)
BUN: 12 mg/dL (ref 6–23)
CO2: 27 meq/L (ref 19–32)
Calcium: 9.5 mg/dL (ref 8.4–10.5)
Chloride: 92 meq/L — ABNORMAL LOW (ref 96–112)
Creatinine, Ser: 0.89 mg/dL (ref 0.40–1.20)
GFR: 69.16 mL/min (ref >60.00)
Glucose, Bld: 87 mg/dL (ref 70–99)
Potassium: 3.8 meq/L (ref 3.5–5.1)
Sodium: 129 meq/L — ABNORMAL LOW (ref 135–145)
Total Bilirubin: 0.4 mg/dL (ref 0.2–1.2)
Total Protein: 7.4 g/dL (ref 6.0–8.3)

## 2023-10-26 LAB — CBC WITH DIFFERENTIAL/PLATELET
Basophils Absolute: 0 K/uL (ref 0.0–0.1)
Basophils Relative: 0.8 % (ref 0.0–3.0)
Eosinophils Absolute: 0.1 K/uL (ref 0.0–0.7)
Eosinophils Relative: 2.7 % (ref 0.0–5.0)
HCT: 39.4 % (ref 36.0–46.0)
Hemoglobin: 13.4 g/dL (ref 12.0–15.0)
Lymphocytes Relative: 30.2 % (ref 12.0–46.0)
Lymphs Abs: 1.5 K/uL (ref 0.7–4.0)
MCHC: 34.1 g/dL (ref 30.0–36.0)
MCV: 85.8 fl (ref 78.0–100.0)
Monocytes Absolute: 0.6 K/uL (ref 0.1–1.0)
Monocytes Relative: 12.6 % — ABNORMAL HIGH (ref 3.0–12.0)
Neutro Abs: 2.7 K/uL (ref 1.4–7.7)
Neutrophils Relative %: 53.7 % (ref 43.0–77.0)
Platelets: 294 K/uL (ref 150.0–400.0)
RBC: 4.59 Mil/uL (ref 3.87–5.11)
RDW: 13.9 % (ref 11.5–15.5)
WBC: 5 K/uL (ref 4.0–10.5)

## 2023-10-26 LAB — VITAMIN D 25 HYDROXY (VIT D DEFICIENCY, FRACTURES): VITD: 44.22 ng/mL (ref 30.00–100.00)

## 2023-10-26 LAB — LIPID PANEL
Cholesterol: 172 mg/dL (ref 0–200)
HDL: 60.1 mg/dL (ref >39.00)
LDL Cholesterol: 87 mg/dL (ref 0–99)
NonHDL: 111.52
Total CHOL/HDL Ratio: 3
Triglycerides: 125 mg/dL (ref 0.0–149.0)
VLDL: 25 mg/dL (ref 0.0–40.0)

## 2023-10-26 LAB — TSH: TSH: 1.23 u[IU]/mL (ref 0.35–5.50)

## 2023-10-26 LAB — VITAMIN B12: Vitamin B-12: 426 pg/mL (ref 211–911)

## 2023-10-26 LAB — HEMOGLOBIN A1C: Hgb A1c MFr Bld: 6.3 % (ref 4.6–6.5)

## 2023-10-26 MED ORDER — OLMESARTAN MEDOXOMIL-HCTZ 40-25 MG PO TABS
1.0000 | ORAL_TABLET | Freq: Every day | ORAL | 1 refills | Status: DC
Start: 2023-10-26 — End: 2023-11-16

## 2023-10-26 NOTE — Progress Notes (Signed)
 Established Patient Office Visit     CC/Reason for Visit: Annual preventive exam  HPI: Mary Reyes is a 63 y.o. female who is coming in today for the above mentioned reasons. Past Medical History is significant for: Morbid obesity, hypertension, hypothyroidism, depression followed by psychiatry.  She is feeling well, no major concerns or complaints.   Past Medical/Surgical History: Past Medical History:  Diagnosis Date   Allergy    Anxiety    Arthritis    Blood in stool    Colon polyp    Depression    Eczema    Frequent headaches    GERD (gastroesophageal reflux disease)    Hypertension    Hypothyroidism    Migraine    OSA (obstructive sleep apnea)    CPAP nightly   Osteopenia    Sleep apnea    Thyroid  disease    Trimalleolar fracture of ankle, closed, left, sequela     Past Surgical History:  Procedure Laterality Date   ABDOMINAL HYSTERECTOMY  2004   ORIF ANKLE FRACTURE Left 02/12/2016   Procedure: OPEN REDUCTION INTERNAL FIXATION (ORIF) LEFT ANKLE FRACTURE;  Surgeon: Kay CHRISTELLA Cummins, MD;  Location: [facility];  Service: Orthopedics;  Laterality: Left;   WISDOM TOOTH EXTRACTION      Social History:  reports that she has never smoked. She has never used smokeless tobacco. She reports that she does not drink alcohol and does not use drugs.  Allergies: Allergies  Allergen Reactions   Adhesive [Tape] Rash   Benzoyl Peroxide Rash    Benzine    Milk-Related Compounds Rash    Family History:  Family History  Problem Relation Age of Onset   Healthy Mother    Alcohol abuse Father        Deceased   Brain cancer Sister    Asthma Sister    Stroke Maternal Grandmother    Arthritis Paternal Grandmother    Depression Daughter    Heart murmur Daughter    Allergic rhinitis Daughter    Fibromyalgia Daughter    Allergic rhinitis Daughter    Migraines Son    Allergic rhinitis Son    Angioedema Neg Hx    Eczema Neg Hx    Immunodeficiency  Neg Hx    Urticaria Neg Hx    Colon cancer Neg Hx    Esophageal cancer Neg Hx    Stomach cancer Neg Hx    Rectal cancer Neg Hx      Current Outpatient Medications:    ALPRAZolam  (XANAX ) 0.25 MG tablet, Take 0.25 mg by mouth at bedtime as needed for anxiety or sleep., Disp: , Rfl: 0   ARIPiprazole  (ABILIFY ) 2 MG tablet, Take 2 mg by mouth daily., Disp: , Rfl:    Azelastine  HCl 0.15 % SOLN, Place into the nose., Disp: , Rfl:    b complex vitamins capsule, Take 1 capsule by mouth daily., Disp: , Rfl:    buPROPion  (WELLBUTRIN  XL) 300 MG 24 hr tablet, Take 300 mg by mouth every morning., Disp: , Rfl:    calcium -vitamin D  (OSCAL WITH D) 500-200 MG-UNIT tablet, Take 1 tablet by mouth 3 (three) times daily., Disp: 90 tablet, Rfl: 12   cholecalciferol  (VITAMIN D ) 1000 units tablet, Take 2,000 Units by mouth at bedtime., Disp: , Rfl:    cyclobenzaprine  (FLEXERIL ) 10 MG tablet, Take 1 tablet (10 mg total) by mouth 3 (three) times daily as needed for muscle spasms., Disp: 90 tablet, Rfl: 1  desvenlafaxine (PRISTIQ) 100 MG 24 hr tablet, Take 100 mg by mouth every morning., Disp: , Rfl:    famotidine-calcium  carbonate-magnesium  hydroxide (PEPCID COMPLETE) 10-800-165 MG chewable tablet, Chew 1 tablet by mouth daily as needed., Disp: , Rfl:    fexofenadine (ALLEGRA) 180 MG tablet, Take by mouth., Disp: , Rfl:    fluticasone  (FLONASE ) 50 MCG/ACT nasal spray, Place into both nostrils daily., Disp: , Rfl:    ibuprofen  (ADVIL ,MOTRIN ) 200 MG tablet, Take 400 mg by mouth every 6 (six) hours as needed for headache, mild pain or moderate pain., Disp: , Rfl:    levothyroxine  (SYNTHROID ) 50 MCG tablet, TAKE 1 TABLET BY MOUTH EVERY MORNING MONDAY-SATURDAY AND TAKE 2 TABLETS BY MOUTH ON SUNDAY, Disp: 105 tablet, Rfl: 1   liothyronine  (CYTOMEL ) 5 MCG tablet, TAKE 1 AND 1/2 TABLETS BY MOUTH ONCE DAILY, Disp: 135 tablet, Rfl: 3   meclizine  (ANTIVERT ) 25 MG tablet, Take 1 tablet (25 mg total) by mouth every 4 (four) hours  as needed for dizziness., Disp: 60 tablet, Rfl: 0   metoprolol  tartrate (LOPRESSOR ) 25 MG tablet, Take 0.5 tablets (12.5 mg total) by mouth 2 (two) times daily., Disp: 180 tablet, Rfl: 1   amLODipine  (NORVASC ) 5 MG tablet, Take 1 tablet (5 mg total) by mouth daily., Disp: 90 tablet, Rfl: 1   olmesartan -hydrochlorothiazide  (BENICAR  HCT) 40-25 MG tablet, Take 1 tablet by mouth daily., Disp: 90 tablet, Rfl: 1  Review of Systems:  Negative unless indicated in HPI.   Physical Exam: Vitals:   10/26/23 1009  BP: 120/68  Pulse: 60  Temp: 98.1 F (36.7 C)  TempSrc: Oral  SpO2: 96%  Weight: 261 lb 11.2 oz (118.7 kg)  Height: 5' 3 (1.6 m)    Body mass index is 46.36 kg/m.   Physical Exam Vitals reviewed.  Constitutional:      General: She is not in acute distress.    Appearance: Normal appearance. She is obese. She is not ill-appearing, toxic-appearing or diaphoretic.  HENT:     Head: Normocephalic.     Right Ear: Tympanic membrane, ear canal and external ear normal. There is no impacted cerumen.     Left Ear: Tympanic membrane, ear canal and external ear normal. There is no impacted cerumen.     Nose: Nose normal.     Mouth/Throat:     Mouth: Mucous membranes are moist.     Pharynx: Oropharynx is clear. No oropharyngeal exudate or posterior oropharyngeal erythema.  Eyes:     General: No scleral icterus.       Right eye: No discharge.        Left eye: No discharge.     Conjunctiva/sclera: Conjunctivae normal.     Pupils: Pupils are equal, round, and reactive to light.  Neck:     Vascular: No carotid bruit.  Cardiovascular:     Rate and Rhythm: Normal rate and regular rhythm.     Pulses: Normal pulses.     Heart sounds: Normal heart sounds.  Pulmonary:     Effort: Pulmonary effort is normal. No respiratory distress.     Breath sounds: Normal breath sounds.  Abdominal:     General: Abdomen is flat. Bowel sounds are normal.     Palpations: Abdomen is soft.   Musculoskeletal:        General: Normal range of motion.     Cervical back: Normal range of motion.  Skin:    General: Skin is warm and dry.  Neurological:  General: No focal deficit present.     Mental Status: She is alert and oriented to person, place, and time. Mental status is at baseline.  Psychiatric:        Mood and Affect: Mood normal.        Behavior: Behavior normal.        Thought Content: Thought content normal.        Judgment: Judgment normal.        10/26/2023   10:23 AM 10/22/2022   10:03 AM  MMSE - Mini Mental State Exam  Orientation to time 5 3  Orientation to Place 5 5  Registration 3 3  Attention/ Calculation 5 5  Recall 3 3  Language- name 2 objects 2 2  Language- repeat 1 1  Language- follow 3 step command 3 3  Language- read & follow direction 1 1  Write a sentence 1 1  Copy design 1 1  Total score 30 28      Impression and Plan:  Encounter for preventive health examination  Essential hypertension -     CBC with Differential/Platelet; Future -     Comprehensive metabolic panel with GFR; Future -     Lipid panel; Future -     Olmesartan  Medoxomil-HCTZ; Take 1 tablet by mouth daily.  Dispense: 90 tablet; Refill: 1  IGT (impaired glucose tolerance) -     Hemoglobin A1c; Future  Acquired hypothyroidism  Vitamin D  deficiency -     VITAMIN D  25 Hydroxy (Vit-D Deficiency, Fractures); Future  Immunization due  Morbid obesity (HCC) -     TSH; Future -     Vitamin B12; Future   -Recommend routine eye and dental care. -Healthy lifestyle discussed in detail. -Labs to be updated today. -Prostate cancer screening: Not applicable Health Maintenance  Topic Date Due   Hepatitis C Screening  Never done   COVID-19 Vaccine (4 - 2024-25 season) 11/29/2022   HIV Screening  01/04/2024*   Flu Shot  10/29/2023   Mammogram  01/10/2025   Colon Cancer Screening  11/28/2026   DTaP/Tdap/Td vaccine (2 - Td or Tdap) 05/06/2028   Zoster (Shingles)  Vaccine  Completed   Hepatitis B Vaccine  Aged Out   HPV Vaccine  Aged Out   Meningitis B Vaccine  Aged Out  *Topic was postponed. The date shown is not the original due date.    PCV 20 administered in office today.    Tully Theophilus Andrews, MD [facility]

## 2023-10-26 NOTE — Addendum Note (Signed)
 Addended by: KATHRYNE MILLMAN B on: 10/26/2023 10:57 AM   Modules accepted: Orders

## 2023-10-27 ENCOUNTER — Ambulatory Visit: Payer: Self-pay | Admitting: Internal Medicine

## 2023-10-27 DIAGNOSIS — E871 Hypo-osmolality and hyponatremia: Secondary | ICD-10-CM

## 2023-10-28 ENCOUNTER — Encounter: Payer: Self-pay | Admitting: Internal Medicine

## 2023-10-28 ENCOUNTER — Ambulatory Visit (INDEPENDENT_AMBULATORY_CARE_PROVIDER_SITE_OTHER): Payer: BC Managed Care – PPO | Admitting: Internal Medicine

## 2023-10-28 VITALS — BP 130/80 | HR 61 | Ht 63.0 in | Wt 262.0 lb

## 2023-10-28 DIAGNOSIS — M810 Age-related osteoporosis without current pathological fracture: Secondary | ICD-10-CM

## 2023-10-28 DIAGNOSIS — Z1589 Genetic susceptibility to other disease: Secondary | ICD-10-CM

## 2023-10-28 DIAGNOSIS — M8580 Other specified disorders of bone density and structure, unspecified site: Secondary | ICD-10-CM | POA: Diagnosis not present

## 2023-10-28 DIAGNOSIS — E559 Vitamin D deficiency, unspecified: Secondary | ICD-10-CM | POA: Diagnosis not present

## 2023-10-28 DIAGNOSIS — E039 Hypothyroidism, unspecified: Secondary | ICD-10-CM

## 2023-10-28 DIAGNOSIS — R7303 Prediabetes: Secondary | ICD-10-CM

## 2023-10-28 MED ORDER — LEVOTHYROXINE SODIUM 50 MCG PO TABS: ORAL_TABLET | ORAL | 3 refills | Status: AC

## 2023-10-28 MED ORDER — LIOTHYRONINE SODIUM 5 MCG PO TABS: 7.5000 ug | ORAL_TABLET | Freq: Every day | ORAL | 3 refills | Status: AC

## 2023-10-28 NOTE — Progress Notes (Signed)
 Patient ID: Mary Reyes, female   DOB: [birth year], 63 y.o.   MRN: [id number]  HPI  Mary Reyes is a 63 y.o.-year-old female, returning for f/u for hypothyroidism, vitamin D  deficiency and osteoporosis. Last visit 1 year ago.  Interim history: She continues to have fatigue.  Also, dysesthesia, which has been investigated previously without a clear pathology found.  She also has mild tremors in her left hand especially when holding a pen or a piece of paper.  No neck pain.  No associated numbness or weakness in her arm. She is not exercising.  She does have muscle tightness in her thighs especially when she exerts herself.  Hypothyroidism: Reviewed history: She tells me that her thyroid  problem started when she found a lump in her neck in 2001. She saw ENT at that time, who was following the nodule, which kept growing. She remembers that she was then dx with Mononucleosis. She had a FNA of the nodule and this apparently returned positive for lymphoid tissue. It was considered that this was an enlarged lymph node possibly from mononucleosis. She did not have a thyroid  U/S after this episode. She remembers that the nodule did decrease in size after prednisone.  Patient was found to be hyperthyroid during the investigation for her enlarged lymph node. She initially tried oral anti-thyroid  medicines, which were not effective. She had RAI tx in 2002. She did not have the start LT4 after her RAI Tx, but became hypothyroid and had to start levothyroxine  in 2008. She is not sure why she was recommended to add LT3, but she believes that it was added because of her history of depression.   She is on levothyroxine  50 mcg 6 out of 7 days and 100 mcg 1 out of 7 days + liothyronine  7.5 mcg daily: - in am - fasting - at least 30 min from b'fast - + Ca, PPIs in the evening - + MVI in the evening - no Fe - not on Biotin  except MVI  Reviewed her TFTs: Lab Results  Component Value Date   TSH 1.23  10/26/2023   TSH 2.09 06/07/2023   TSH 1.08 07/16/2022   TSH 1.34 10/16/2021   TSH 1.50 07/15/2021   FREET4 0.65 10/16/2021   FREET4 0.64 10/20/2018   FREET4 0.66 10/26/2017   FREET4 0.72 09/08/2016   FREET4 0.84 02/03/2016  04/03/2014: TSH 0.92, fT4 0.97, fT3 2.8  Pt denies: - feeling nodules in neck - hoarseness - dysphagia - choking  She has + FH of thyroid  disorders in: thyroiditis. No FH of thyroid  cancer. + h/o radiation tx to head or neck. No herbal supplements. No recent steroids use.   Vitamin D  deficiency: Previously on ergocalciferol , but then changed to 2000 units vitamin D3 daily.  Reviewed vitamin D  levels: Lab Results  Component Value Date   VD25OH 44.22 10/26/2023   VD25OH 48.79 07/16/2022   VD25OH 42.01 07/15/2021   VD25OH 41.96 07/09/2020   VD25OH 37 01/10/2020   VD25OH 44.77 07/06/2019   VD25OH 47.21 10/20/2018   VD25OH 43.35 10/26/2017   VD25OH 43.80 11/03/2016   VD25OH 42.23 02/03/2016   Clinical osteoporosis:  Reviewed the available DXA scan reports - osteopenia: 11/13/2021 ([facility])  Lumbar spine L1-L4 Femoral neck (FN)  T-score -1.4 RFN: -1.2 LFN: -1.5  Change in BMD from previous DXA test (%) Up 1.2% Up 4.3%*  (*) statistically significant  10/29/2019 ([facility]) Lumbar spine L1-L4 Femoral neck (FN)  T-score -1.5 RFN: -1.7 LFN: -1.5  Change in BMD from previous DXA test (%) Up 3.5% Down 9.1%  (*) statistically significant  - DXA scan 11/10/2016: L1-L4: T -1.7 RFN: T -1.1 LFN: T -1.0  FRAX MOF risk: 9.4%, Hip fx risk 0.5% - DXA scan 09/05/2014: L1-L4: T -1.6 RFN: T -1.3 LFN: T -1.0 FRAX MOF risk: 5.1%, Hip fx risk 0.3% -She had a fragility fracture (L ankle) in 01/2016 after sliding on the ground.  She had to have surgery for this.  No fractures since then. -No previous antiresorptive therapy.  She had dysesthesia and I checked her for MTHFR mutation in the past.  This returned positive for: Component     Latest Ref Rng  & Units 12/11/2015  MTHFR      C677T Single mutation (C677T) identified  Vitamin B12     211 - 911 pg/mL 447  Methylmalonic Acid, Quant     87 - 318 nmol/L 111  Vitamin B12 and MMA normal.  Pt is heterozygous for the C667T MTHFR mutation >> this is associated with an approximately 30% decrease in methylation activity.  After the results returned, I called and discussed with the geneticist at [facility], recommended to check a homocystine. If this is high, this could be treated, but otherwise, no intervention is necessary.  Homocysteine was high, so we discussed about taking her methylated vitamin B consistently.    Latest homocystine level finally normalized: Component     Latest Ref Rng & Units 11/28/2019  Homocysteine     <10.4 umol/L 7.7   Component     Latest Ref Rng & Units 11/03/2016 10/26/2017 10/20/2018  Homocysteine     <10.4 umol/L 10.7 (H) 11.4 (H) 11.6 (H)   Previous endocrinologist Dr. Jacquelynne [phone] - in [place]. She also has a history of ?MS, GERD, HTN, OSA, migraines. She also has prediabetes: Lab Results  Component Value Date   HGBA1C 6.3 10/26/2023   HGBA1C 5.8 (A) 04/22/2023   HGBA1C 5.8 (A) 10/22/2022   HGBA1C 5.8 07/16/2022   HGBA1C 5.9 01/08/2022   HGBA1C 5.9 10/16/2021   HGBA1C 6.0 07/15/2021   HGBA1C 5.8 07/09/2020   HGBA1C 5.5 07/06/2019   Father had Lieber's ds. (adult onset blindness in men) Her younger sister has Lewy body dementia - in [place] .   ROS: + See HPI  Past Medical History:  Diagnosis Date   Allergy    Anxiety    Arthritis    Blood in stool    Colon polyp    Depression    Eczema    Frequent headaches    GERD (gastroesophageal reflux disease)    Hypertension    Hypothyroidism    Migraine    OSA (obstructive sleep apnea)    CPAP nightly   Osteopenia    Sleep apnea    Thyroid  disease    Trimalleolar fracture of ankle, closed, left, sequela    Past Surgical History:  Procedure Laterality Date   ABDOMINAL  HYSTERECTOMY  2004   ORIF ANKLE FRACTURE Left 02/12/2016   Procedure: OPEN REDUCTION INTERNAL FIXATION (ORIF) LEFT ANKLE FRACTURE;  Surgeon: Kay CHRISTELLA Cummins, MD;  Location: [facility];  Service: Orthopedics;  Laterality: Left;   WISDOM TOOTH EXTRACTION     History   Social History   Marital Status: Married    Spouse Name: N/A   Number of Children:  3    Occupational History   N/a   Social History Main Topics   Smoking status: Never Smoker  Smokeless tobacco: Not on file   Alcohol Use: No   Drug Use: No   Current Outpatient Medications on File Prior to Visit  Medication Sig Dispense Refill   ALPRAZolam  (XANAX ) 0.25 MG tablet Take 0.25 mg by mouth at bedtime as needed for anxiety or sleep.  0   amLODipine  (NORVASC ) 5 MG tablet Take 1 tablet (5 mg total) by mouth daily. 90 tablet 1   ARIPiprazole  (ABILIFY ) 2 MG tablet Take 2 mg by mouth daily.     Azelastine  HCl 0.15 % SOLN Place into the nose.     b complex vitamins capsule Take 1 capsule by mouth daily.     buPROPion  (WELLBUTRIN  XL) 300 MG 24 hr tablet Take 300 mg by mouth every morning.     calcium -vitamin D  (OSCAL WITH D) 500-200 MG-UNIT tablet Take 1 tablet by mouth 3 (three) times daily. 90 tablet 12   cholecalciferol  (VITAMIN D ) 1000 units tablet Take 2,000 Units by mouth at bedtime.     cyclobenzaprine  (FLEXERIL ) 10 MG tablet Take 1 tablet (10 mg total) by mouth 3 (three) times daily as needed for muscle spasms. 90 tablet 1   desvenlafaxine (PRISTIQ) 100 MG 24 hr tablet Take 100 mg by mouth every morning.     famotidine-calcium  carbonate-magnesium  hydroxide (PEPCID COMPLETE) 10-800-165 MG chewable tablet Chew 1 tablet by mouth daily as needed.     fexofenadine (ALLEGRA) 180 MG tablet Take by mouth.     fluticasone  (FLONASE ) 50 MCG/ACT nasal spray Place into both nostrils daily.     ibuprofen  (ADVIL ,MOTRIN ) 200 MG tablet Take 400 mg by mouth every 6 (six) hours as needed for headache, mild pain or moderate pain.      levothyroxine  (SYNTHROID ) 50 MCG tablet TAKE 1 TABLET BY MOUTH EVERY MORNING MONDAY-SATURDAY AND TAKE 2 TABLETS BY MOUTH ON SUNDAY 105 tablet 1   liothyronine  (CYTOMEL ) 5 MCG tablet TAKE 1 AND 1/2 TABLETS BY MOUTH ONCE DAILY 135 tablet 3   meclizine  (ANTIVERT ) 25 MG tablet Take 1 tablet (25 mg total) by mouth every 4 (four) hours as needed for dizziness. 60 tablet 0   metoprolol  tartrate (LOPRESSOR ) 25 MG tablet Take 0.5 tablets (12.5 mg total) by mouth 2 (two) times daily. 180 tablet 1   olmesartan -hydrochlorothiazide  (BENICAR  HCT) 40-25 MG tablet Take 1 tablet by mouth daily. 90 tablet 1   No current facility-administered medications on file prior to visit.   Allergies  Allergen Reactions   Adhesive [Tape] Rash   Benzoyl Peroxide Rash    Benzine    Milk-Related Compounds Rash   Family History  Problem Relation Age of Onset   Healthy Mother    Alcohol abuse Father        Deceased   Brain cancer Sister    Asthma Sister    Stroke Maternal Grandmother    Arthritis Paternal Grandmother    Depression Daughter    Heart murmur Daughter    Allergic rhinitis Daughter    Fibromyalgia Daughter    Allergic rhinitis Daughter    Migraines Son    Allergic rhinitis Son    Angioedema Neg Hx    Eczema Neg Hx    Immunodeficiency Neg Hx    Urticaria Neg Hx    Colon cancer Neg Hx    Esophageal cancer Neg Hx    Stomach cancer Neg Hx    Rectal cancer Neg Hx   + see HPI Also: Sisters with hypertension Child with PDA 2 daughters with history of thyroiditis Sister  with brain tumor  PE: BP 130/80   Pulse 61   Ht 5' 3 (1.6 m)   Wt 262 lb (118.8 kg)   SpO2 94%   BMI 46.41 kg/m   Wt Readings from Last 15 Encounters:  10/28/23 262 lb (118.8 kg)  10/26/23 261 lb 11.2 oz (118.7 kg)  08/31/23 261 lb 1.6 oz (118.4 kg)  07/22/23 259 lb (117.5 kg)  06/07/23 265 lb (120.2 kg)  06/03/23 262 lb 14.4 oz (119.3 kg)  04/29/23 261 lb 3.2 oz (118.5 kg)  04/22/23 259 lb 1.6 oz (117.5 kg)   01/04/23 260 lb 3.2 oz (118 kg)  12/22/22 260 lb (117.9 kg)  10/27/22 265 lb 3.2 oz (120.3 kg)  10/22/22 261 lb 9.6 oz (118.7 kg)  10/20/22 261 lb 9.6 oz (118.7 kg)  07/30/22 256 lb 9.6 oz (116.4 kg)  07/16/22 254 lb (115.2 kg)   Constitutional: overweight, in NAD Eyes:  EOMI, no exophthalmos ENT: no neck masses, no cervical lymphadenopathy Cardiovascular: RRR, No MRG, + periankle B edema, nonpitting Respiratory: CTA B Musculoskeletal: no deformities Skin:no rashes Neurological: no tremor with outstretched hands  ASSESSMENT: 1. Hypothyroidism  2. Vitamin D  deficiency  3. Osteopenia  4. MTHFR heterozygosity - C677T normal  5.  Elevated HbA1c  PLAN:  1. Hypothyroidism - Patient has longstanding hypothyroidism, controlled on a complex regimen comprised of both levothyroxine  and liothyronine  - latest thyroid  labs reviewed with pt. >> normal: Lab Results  Component Value Date   TSH 1.23 10/26/2023  - she continues on LT4 50 mcg 6/7 days, 100 mcg 1/7 days, along with LT3 7.5 mcg daily - pt feels good on this dose, except for fatigue, which is chronic for her. - we discussed about taking the thyroid  hormone every day, with water, >30 minutes before breakfast, separated by >4 hours from acid reflux medications, calcium , iron, multivitamins. Pt. is taking it correctly. - I refilled her thyroid  hormone prescriptions today  2. Vitamin D  deficiency - Latest vitamin D  level was normal (see below) 2 days ago - will continue 2000 units vitamin D  daily  3. Osteopenia -She has a history of ankle fracture in 2017 but no fractures since then -Reviewed the latest bone density scan reports from 10/29/2019: Worsening of the T-scores at the level of the femoral necks, but improved bone density at the spine -she had another bone density scan (11/13/2021) and we reviewed the results together: There was a 4.3% increase in bone density at the femoral necks, otherwise stable.  The FRAX score was  normal.  She is due for another bone density scan later this year.  I ordered this today. - I recommended weightbearing exercises but she is not able to do these. - Vitamin D  level was recently normal: Lab Results  Component Value Date   VD25OH 44.22 10/26/2023   4. MTHFR heterozygosity -In the past, homocystine levels were high and we started methylated vitamin B complex.  She was not taking them consistently in the past but we discussed about the importance of taking them daily.  We discussed that a B12 level is not directly reflecting the methylated B vitamins concentrations. - Latest B12 level was normal 2 days ago: Lab Results  Component Value Date   VITAMINB12 426 10/26/2023  - Latest homocystine level was normal 4 years ago, at 7.7 - We discussed again at today's visit the importance of taking methylated B vitamins.  She continues to take only a methylated folate.  At today's visit, we  looked together online and I again recommended several options for methylated B complex on [employer].  She will try these.  5.  Prediabetes - She had several HbA1c levels in the prediabetic range, with the latest being 6.3%, increased from 5.8-5.9%. - We previously discussed about the fact that prediabetes is a reversible condition with improving diet and starting a consistent exercise regimen  Lela Fendt, MD PhD [facility]

## 2023-10-28 NOTE — Patient Instructions (Addendum)
 Please try to start a methylated vitamin B complex daily.  Please continue Levothyroxine  50 mcg on 6/7 days, and 100 mcg 1/7 days + Cytomel  7.5 mcg.  Take the thyroid  hormone every day, with water, at least 30 minutes before breakfast, separated by at least 4 hours from: - acid reflux medications - calcium  - iron - multivitamins  Please call and schedule bone density scan at the [facility] after 11/14/2023): [phone].   Please return in 1 year.

## 2023-11-10 ENCOUNTER — Other Ambulatory Visit (INDEPENDENT_AMBULATORY_CARE_PROVIDER_SITE_OTHER)

## 2023-11-10 DIAGNOSIS — E871 Hypo-osmolality and hyponatremia: Secondary | ICD-10-CM | POA: Diagnosis not present

## 2023-11-10 LAB — BASIC METABOLIC PANEL WITH GFR
BUN: 12 mg/dL (ref 6–23)
CO2: 30 meq/L (ref 19–32)
Calcium: 9.2 mg/dL (ref 8.4–10.5)
Chloride: 91 meq/L — ABNORMAL LOW (ref 96–112)
Creatinine, Ser: 0.98 mg/dL (ref 0.40–1.20)
GFR: 61.59 mL/min (ref >60.00)
Glucose, Bld: 111 mg/dL — ABNORMAL HIGH (ref 70–99)
Potassium: 3.8 meq/L (ref 3.5–5.1)
Sodium: 129 meq/L — ABNORMAL LOW (ref 135–145)

## 2023-11-11 ENCOUNTER — Ambulatory Visit: Payer: Self-pay | Admitting: Internal Medicine

## 2023-11-11 DIAGNOSIS — I10 Essential (primary) hypertension: Secondary | ICD-10-CM

## 2023-11-15 ENCOUNTER — Telehealth: Payer: Self-pay | Admitting: *Deleted

## 2023-11-15 NOTE — Telephone Encounter (Signed)
 Copied from CRM [id number]. Topic: Clinical - Medical Advice >> Nov 15, 2023  2:22 PM Turkey A wrote: Reason for CRM: Patient would like to know what should she do next since she has received her lab results? Please contact

## 2023-11-15 NOTE — Telephone Encounter (Signed)
 Copied from CRM #[id number]. Topic: Clinical - Lab/Test Results >> Nov 12, 2023 10:48 AM Dedra B wrote: Reason for CRM: Pt would like to speak with a nurse regarding her lab results.

## 2023-11-16 ENCOUNTER — Telehealth: Payer: Self-pay | Admitting: *Deleted

## 2023-11-16 MED ORDER — OLMESARTAN MEDOXOMIL 40 MG PO TABS: 40.0000 mg | ORAL_TABLET | Freq: Every day | ORAL | 1 refills | Status: AC

## 2023-11-16 NOTE — Telephone Encounter (Signed)
 Copied from CRM #[id number]. Topic: Clinical - Lab/Test Results >> Nov 16, 2023  1:45 PM Rea C wrote: Reason for CRM: Patient called back to follow up on her most recent lab results. Advised a message was sent yesterday and we are awaiting response from provider.   [phone] (M)

## 2023-11-17 NOTE — Telephone Encounter (Signed)
Patient is aware See lab result note 

## 2023-12-21 ENCOUNTER — Ambulatory Visit (INDEPENDENT_AMBULATORY_CARE_PROVIDER_SITE_OTHER): Admitting: Internal Medicine

## 2023-12-21 ENCOUNTER — Ambulatory Visit: Payer: Self-pay | Admitting: Internal Medicine

## 2023-12-21 ENCOUNTER — Encounter: Payer: Self-pay | Admitting: Internal Medicine

## 2023-12-21 VITALS — BP 118/80 | HR 55 | Temp 98.2°F | Wt 263.8 lb

## 2023-12-21 DIAGNOSIS — I10 Essential (primary) hypertension: Secondary | ICD-10-CM

## 2023-12-21 DIAGNOSIS — G4733 Obstructive sleep apnea (adult) (pediatric): Secondary | ICD-10-CM | POA: Diagnosis not present

## 2023-12-21 DIAGNOSIS — Z23 Encounter for immunization: Secondary | ICD-10-CM | POA: Diagnosis not present

## 2023-12-21 DIAGNOSIS — R7302 Impaired glucose tolerance (oral): Secondary | ICD-10-CM

## 2023-12-21 DIAGNOSIS — E871 Hypo-osmolality and hyponatremia: Secondary | ICD-10-CM | POA: Diagnosis not present

## 2023-12-21 LAB — BASIC METABOLIC PANEL WITH GFR
BUN: 10 mg/dL (ref 6–23)
CO2: 30 meq/L (ref 19–32)
Calcium: 9.5 mg/dL (ref 8.4–10.5)
Chloride: 101 meq/L (ref 96–112)
Creatinine, Ser: 1.01 mg/dL (ref 0.40–1.20)
GFR: 59.36 mL/min — ABNORMAL LOW (ref >60.00)
Glucose, Bld: 82 mg/dL (ref 70–99)
Potassium: 4.1 meq/L (ref 3.5–5.1)
Sodium: 136 meq/L (ref 135–145)

## 2023-12-21 MED ORDER — TIRZEPATIDE-WEIGHT MANAGEMENT 2.5 MG/0.5ML ~~LOC~~ SOAJ: 2.5000 mg | SUBCUTANEOUS | 0 refills | Status: DC

## 2023-12-21 NOTE — Addendum Note (Signed)
 Addended by: KATHRYNE MILLMAN B on: 12/21/2023 10:48 AM   Modules accepted: Orders

## 2023-12-21 NOTE — Progress Notes (Signed)
 Established Patient Office Visit     CC/Reason for Visit: Follow-up chronic medical conditions  HPI: Mary Reyes is a 63 y.o. female who is coming in today for the above mentioned reasons. Past Medical History is significant for: Morbid obesity, obstructive sleep apnea on CPAP therapy, hypertension with recent hyponatremia requiring discontinuation of hydrochlorothiazide  here for blood pressure check, hypothyroidism, depression, vitamin D  deficiency.  She is feeling improved.  Home blood pressure measurements have been improved.  Requesting flu vaccine.  She is wanting to see if she would be a good candidate for GLP-1 for weight loss and for treatment of sleep apnea.  She has been working hard on lifestyle changes and is still having a difficult time getting to her goal weight.   Past Medical/Surgical History: Past Medical History:  Diagnosis Date   Allergy    Anxiety    Arthritis    Blood in stool    Colon polyp    Depression    Eczema    Frequent headaches    GERD (gastroesophageal reflux disease)    Hypertension    Hypothyroidism    Migraine    OSA (obstructive sleep apnea)    CPAP nightly   Osteopenia    Sleep apnea    Thyroid  disease    Trimalleolar fracture of ankle, closed, left, sequela     Past Surgical History:  Procedure Laterality Date   ABDOMINAL HYSTERECTOMY  2004   ORIF ANKLE FRACTURE Left 02/12/2016   Procedure: OPEN REDUCTION INTERNAL FIXATION (ORIF) LEFT ANKLE FRACTURE;  Surgeon: Kay CHRISTELLA Cummins, MD;  Location: [facility];  Service: Orthopedics;  Laterality: Left;   WISDOM TOOTH EXTRACTION      Social History:  reports that she has never smoked. She has never used smokeless tobacco. She reports that she does not drink alcohol and does not use drugs.  Allergies: Allergies  Allergen Reactions   Adhesive [Tape] Rash   Benzoyl Peroxide Rash    Benzine    Milk-Related Compounds Rash    Family History:  Family History   Problem Relation Age of Onset   Healthy Mother    Alcohol abuse Father        Deceased   Brain cancer Sister    Asthma Sister    Stroke Maternal Grandmother    Arthritis Paternal Grandmother    Depression Daughter    Heart murmur Daughter    Allergic rhinitis Daughter    Fibromyalgia Daughter    Allergic rhinitis Daughter    Migraines Son    Allergic rhinitis Son    Angioedema Neg Hx    Eczema Neg Hx    Immunodeficiency Neg Hx    Urticaria Neg Hx    Colon cancer Neg Hx    Esophageal cancer Neg Hx    Stomach cancer Neg Hx    Rectal cancer Neg Hx      Current Outpatient Medications:    ALPRAZolam  (XANAX ) 0.25 MG tablet, Take 0.25 mg by mouth at bedtime as needed for anxiety or sleep., Disp: , Rfl: 0   ARIPiprazole  (ABILIFY ) 2 MG tablet, Take 2 mg by mouth daily., Disp: , Rfl:    Azelastine  HCl 0.15 % SOLN, Place into the nose., Disp: , Rfl:    b complex vitamins capsule, Take 1 capsule by mouth daily., Disp: , Rfl:    buPROPion  (WELLBUTRIN  XL) 300 MG 24 hr tablet, Take 300 mg by mouth every morning., Disp: , Rfl:    calcium -vitamin  D (OSCAL WITH D) 500-200 MG-UNIT tablet, Take 1 tablet by mouth 3 (three) times daily., Disp: 90 tablet, Rfl: 12   cholecalciferol  (VITAMIN D ) 1000 units tablet, Take 2,000 Units by mouth at bedtime., Disp: , Rfl:    cyclobenzaprine  (FLEXERIL ) 10 MG tablet, Take 1 tablet (10 mg total) by mouth 3 (three) times daily as needed for muscle spasms., Disp: 90 tablet, Rfl: 1   desvenlafaxine (PRISTIQ) 100 MG 24 hr tablet, Take 100 mg by mouth every morning., Disp: , Rfl:    famotidine-calcium  carbonate-magnesium  hydroxide (PEPCID COMPLETE) 10-800-165 MG chewable tablet, Chew 1 tablet by mouth daily as needed., Disp: , Rfl:    fexofenadine (ALLEGRA) 180 MG tablet, Take by mouth., Disp: , Rfl:    fluticasone  (FLONASE ) 50 MCG/ACT nasal spray, Place into both nostrils daily., Disp: , Rfl:    ibuprofen  (ADVIL ,MOTRIN ) 200 MG tablet, Take 400 mg by mouth every 6  (six) hours as needed for headache, mild pain or moderate pain., Disp: , Rfl:    levothyroxine  (SYNTHROID ) 50 MCG tablet, TAKE 1 TABLET BY MOUTH EVERY MORNING MONDAY-SATURDAY AND TAKE 2 TABLETS BY MOUTH ON SUNDAY, Disp: 105 tablet, Rfl: 3   liothyronine  (CYTOMEL ) 5 MCG tablet, Take 1.5 tablets (7.5 mcg total) by mouth daily., Disp: 135 tablet, Rfl: 3   meclizine  (ANTIVERT ) 25 MG tablet, Take 1 tablet (25 mg total) by mouth every 4 (four) hours as needed for dizziness., Disp: 60 tablet, Rfl: 0   metoprolol  tartrate (LOPRESSOR ) 25 MG tablet, Take 0.5 tablets (12.5 mg total) by mouth 2 (two) times daily., Disp: 180 tablet, Rfl: 1   olmesartan  (BENICAR ) 40 MG tablet, Take 1 tablet (40 mg total) by mouth daily., Disp: 90 tablet, Rfl: 1   tirzepatide  (ZEPBOUND ) 2.5 MG/0.5ML Pen, Inject 2.5 mg into the skin once a week., Disp: 2 mL, Rfl: 0  Review of Systems:  Negative unless indicated in HPI.   Physical Exam: Vitals:   12/21/23 0950  BP: 118/80  Pulse: (!) 55  Temp: 98.2 F (36.8 C)  TempSrc: Oral  SpO2: 96%  Weight: 263 lb 12.8 oz (119.7 kg)    Body mass index is 46.73 kg/m.   Physical Exam Vitals reviewed.  Constitutional:      Appearance: Normal appearance. She is obese.  HENT:     Head: Normocephalic and atraumatic.  Eyes:     Conjunctiva/sclera: Conjunctivae normal.  Cardiovascular:     Rate and Rhythm: Normal rate and regular rhythm.  Pulmonary:     Effort: Pulmonary effort is normal.     Breath sounds: Normal breath sounds.  Skin:    General: Skin is warm and dry.  Neurological:     General: No focal deficit present.     Mental Status: She is alert and oriented to person, place, and time.  Psychiatric:        Mood and Affect: Mood normal.        Behavior: Behavior normal.        Thought Content: Thought content normal.        Judgment: Judgment normal.      Impression and Plan:  Hyponatremia -     Basic metabolic panel with GFR; Future  Essential  hypertension  OSA (obstructive sleep apnea) -     Tirzepatide -Weight Management; Inject 2.5 mg into the skin once a week.  Dispense: 2 mL; Refill: 0  Morbid obesity (HCC) -     Tirzepatide -Weight Management; Inject 2.5 mg into the skin once a week.  Dispense: 2 mL; Refill: 0  IGT (impaired glucose tolerance)   - Recent A1c stable in the prediabetic range. - Blood pressure is well-controlled on Benicar  40 mg and Lopressor  12.5 mg twice daily. - Check BMP to follow sodium levels now that she is off hydrochlorothiazide . -Discussed healthy lifestyle, including increased physical activity and better food choices to promote weight loss. -I think she would be a great candidate for Zepbound  to assist both with management of her sleep apnea as well as weight loss.  I will send prescription.  Time spent:31 minutes reviewing chart, interviewing and examining patient and formulating plan of care.     Tully Theophilus Andrews, MD [facility]

## 2024-01-07 DIAGNOSIS — G4733 Obstructive sleep apnea (adult) (pediatric): Secondary | ICD-10-CM | POA: Diagnosis not present

## 2024-01-13 ENCOUNTER — Ambulatory Visit
Admission: RE | Admit: 2024-01-13 | Discharge: 2024-01-13 | Disposition: A | Discharge status: Home / Self Care | Source: Ambulatory Visit | Attending: Internal Medicine | Admitting: Internal Medicine

## 2024-01-13 DIAGNOSIS — M810 Age-related osteoporosis without current pathological fracture: Secondary | ICD-10-CM

## 2024-01-14 ENCOUNTER — Ambulatory Visit: Payer: Self-pay | Admitting: Internal Medicine

## 2024-02-04 ENCOUNTER — Other Ambulatory Visit: Payer: Self-pay | Admitting: Internal Medicine

## 2024-02-04 DIAGNOSIS — Z1231 Encounter for screening mammogram for malignant neoplasm of breast: Secondary | ICD-10-CM

## 2024-02-29 ENCOUNTER — Telehealth: Payer: Self-pay

## 2024-02-29 ENCOUNTER — Other Ambulatory Visit (HOSPITAL_COMMUNITY): Payer: Self-pay

## 2024-02-29 ENCOUNTER — Telehealth: Payer: Self-pay | Admitting: *Deleted

## 2024-02-29 ENCOUNTER — Encounter: Payer: Self-pay | Admitting: Internal Medicine

## 2024-02-29 ENCOUNTER — Ambulatory Visit: Admitting: Internal Medicine

## 2024-02-29 ENCOUNTER — Ambulatory Visit
Admission: RE | Admit: 2024-02-29 | Discharge: 2024-02-29 | Disposition: A | Source: Ambulatory Visit | Attending: Internal Medicine | Admitting: Internal Medicine

## 2024-02-29 VITALS — BP 130/70 | HR 59 | Temp 98.2°F | Wt 259.0 lb

## 2024-02-29 DIAGNOSIS — R7302 Impaired glucose tolerance (oral): Secondary | ICD-10-CM

## 2024-02-29 DIAGNOSIS — E039 Hypothyroidism, unspecified: Secondary | ICD-10-CM

## 2024-02-29 DIAGNOSIS — Z1231 Encounter for screening mammogram for malignant neoplasm of breast: Secondary | ICD-10-CM | POA: Diagnosis not present

## 2024-02-29 DIAGNOSIS — G4733 Obstructive sleep apnea (adult) (pediatric): Secondary | ICD-10-CM | POA: Diagnosis not present

## 2024-02-29 DIAGNOSIS — I10 Essential (primary) hypertension: Secondary | ICD-10-CM | POA: Diagnosis not present

## 2024-02-29 LAB — POCT GLYCOSYLATED HEMOGLOBIN (HGB A1C): Hemoglobin A1C: 5.8 % — AB (ref 4.0–5.6)

## 2024-02-29 NOTE — Telephone Encounter (Signed)
 Pharmacy Patient Advocate Encounter   Received notification from Pt Calls Messages that prior authorization for Zepbound  2.5 is required/requested.   Insurance verification completed.   The patient is insured through [employer].   Per test claim: PA required; PA submitted to above mentioned insurance via Latent Key/confirmation #/[id number] [facility] Status is pending

## 2024-02-29 NOTE — Progress Notes (Signed)
 Established Patient Office Visit     CC/Reason for Visit: Follow-up chronic conditions  HPI: Mary Reyes is a 63 y.o. female who is coming in today for the above mentioned reasons. Past Medical History is significant for: Morbid obesity, OSA on chronic CPAP, hypertension, hypothyroidism, impaired glucose tolerance, depression and vitamin D  deficiency.  She is feeling well.  Has no acute concerns or complaints.  Has managed to lose some weight through lifestyle changes.  Is awaiting Zepbound  approval for weight loss and OSA.   Past Medical/Surgical History: Past Medical History:  Diagnosis Date   Allergy    Anxiety    Arthritis    Blood in stool    Colon polyp    Depression    Eczema    Frequent headaches    GERD (gastroesophageal reflux disease)    Hypertension    Hypothyroidism    Migraine    OSA (obstructive sleep apnea)    CPAP nightly   Osteopenia    Sleep apnea    Thyroid  disease    Trimalleolar fracture of ankle, closed, left, sequela     Past Surgical History:  Procedure Laterality Date   ABDOMINAL HYSTERECTOMY  2004   ORIF ANKLE FRACTURE Left 02/12/2016   Procedure: OPEN REDUCTION INTERNAL FIXATION (ORIF) LEFT ANKLE FRACTURE;  Surgeon: Kay CHRISTELLA Cummins, MD;  Location: [facility];  Service: Orthopedics;  Laterality: Left;   WISDOM TOOTH EXTRACTION      Social History:  reports that she has never smoked. She has never used smokeless tobacco. She reports that she does not drink alcohol and does not use drugs.  Allergies: Allergies  Allergen Reactions   Adhesive [Tape] Rash   Benzoyl Peroxide Rash    Benzine    Milk-Related Compounds Rash    Family History:  Family History  Problem Relation Age of Onset   Healthy Mother    Alcohol abuse Father        Deceased   Brain cancer Sister    Asthma Sister    Stroke Maternal Grandmother    Arthritis Paternal Grandmother    Depression Daughter    Heart murmur Daughter    Allergic  rhinitis Daughter    Fibromyalgia Daughter    Allergic rhinitis Daughter    Migraines Son    Allergic rhinitis Son    Angioedema Neg Hx    Eczema Neg Hx    Immunodeficiency Neg Hx    Urticaria Neg Hx    Colon cancer Neg Hx    Esophageal cancer Neg Hx    Stomach cancer Neg Hx    Rectal cancer Neg Hx      Current Outpatient Medications:    ALPRAZolam  (XANAX ) 0.25 MG tablet, Take 0.25 mg by mouth at bedtime as needed for anxiety or sleep., Disp: , Rfl: 0   ARIPiprazole  (ABILIFY ) 2 MG tablet, Take 2 mg by mouth daily., Disp: , Rfl:    Azelastine  HCl 0.15 % SOLN, Place into the nose., Disp: , Rfl:    b complex vitamins capsule, Take 1 capsule by mouth daily., Disp: , Rfl:    buPROPion  (WELLBUTRIN  XL) 300 MG 24 hr tablet, Take 300 mg by mouth every morning., Disp: , Rfl:    calcium -vitamin D  (OSCAL WITH D) 500-200 MG-UNIT tablet, Take 1 tablet by mouth 3 (three) times daily., Disp: 90 tablet, Rfl: 12   cholecalciferol  (VITAMIN D ) 1000 units tablet, Take 2,000 Units by mouth at bedtime., Disp: , Rfl:  cyclobenzaprine  (FLEXERIL ) 10 MG tablet, Take 1 tablet (10 mg total) by mouth 3 (three) times daily as needed for muscle spasms., Disp: 90 tablet, Rfl: 1   desvenlafaxine (PRISTIQ) 100 MG 24 hr tablet, Take 100 mg by mouth every morning., Disp: , Rfl:    famotidine-calcium  carbonate-magnesium  hydroxide (PEPCID COMPLETE) 10-800-165 MG chewable tablet, Chew 1 tablet by mouth daily as needed., Disp: , Rfl:    fexofenadine (ALLEGRA) 180 MG tablet, Take by mouth., Disp: , Rfl:    fluticasone  (FLONASE ) 50 MCG/ACT nasal spray, Place into both nostrils daily., Disp: , Rfl:    ibuprofen  (ADVIL ,MOTRIN ) 200 MG tablet, Take 400 mg by mouth every 6 (six) hours as needed for headache, mild pain or moderate pain., Disp: , Rfl:    levothyroxine  (SYNTHROID ) 50 MCG tablet, TAKE 1 TABLET BY MOUTH EVERY MORNING MONDAY-SATURDAY AND TAKE 2 TABLETS BY MOUTH ON SUNDAY, Disp: 105 tablet, Rfl: 3   liothyronine   (CYTOMEL ) 5 MCG tablet, Take 1.5 tablets (7.5 mcg total) by mouth daily., Disp: 135 tablet, Rfl: 3   meclizine  (ANTIVERT ) 25 MG tablet, Take 1 tablet (25 mg total) by mouth every 4 (four) hours as needed for dizziness., Disp: 60 tablet, Rfl: 0   metoprolol  tartrate (LOPRESSOR ) 25 MG tablet, Take 0.5 tablets (12.5 mg total) by mouth 2 (two) times daily., Disp: 180 tablet, Rfl: 1   olmesartan  (BENICAR ) 40 MG tablet, Take 1 tablet (40 mg total) by mouth daily., Disp: 90 tablet, Rfl: 1   tirzepatide  (ZEPBOUND ) 2.5 MG/0.5ML Pen, Inject 2.5 mg into the skin once a week., Disp: 2 mL, Rfl: 0  Review of Systems:  Negative unless indicated in HPI.   Physical Exam: Vitals:   02/29/24 0947  BP: 130/70  Pulse: (!) 59  Temp: 98.2 F (36.8 C)  TempSrc: Oral  SpO2: 95%  Weight: 259 lb (117.5 kg)    Body mass index is 45.88 kg/m.   Physical Exam   Impression and Plan:  IGT (impaired glucose tolerance) Assessment & Plan: A1c remains stable at 5.8.  Orders: -     POCT glycosylated hemoglobin (Hb A1C)  Essential hypertension Assessment & Plan: Well-controlled on current.   OSA (obstructive sleep apnea) Assessment & Plan: Continue nightly CPAP therapy, message sent to PA team regarding Zepbound  approval.   Morbid obesity (HCC) Assessment & Plan: -Discussed healthy lifestyle, including increased physical activity and better food choices to promote weight loss. - Message sent to PA team in regards to Zepbound  approval.   Acquired hypothyroidism Assessment & Plan: On levothyroxine  50 mg and liothyronine  5 mcg.  Last TSH was within range.      Time spent:32 minutes reviewing chart, interviewing and examining patient and formulating plan of care.     Tully Theophilus Andrews, MD [facility]

## 2024-02-29 NOTE — Telephone Encounter (Signed)
 Pharmacy Patient Advocate Encounter  Received notification from [facility] that Prior Authorization for Zepbound  2.5 has been APPROVED from 01/30/24 to 02/28/25. Ran test claim, Copay is $25.00. This test claim was processed through [facility]- copay amounts may vary at other pharmacies due to pharmacy/plan contracts, or as the patient moves through the different stages of their insurance plan.   PA #/Case ID/Reference #: # [id number]

## 2024-02-29 NOTE — Assessment & Plan Note (Signed)
 On levothyroxine 50 mg and liothyronine 5 mcg.  Last TSH was within range.

## 2024-02-29 NOTE — Assessment & Plan Note (Signed)
 Continue nightly CPAP therapy, message sent to PA team regarding Zepbound  approval.

## 2024-02-29 NOTE — Assessment & Plan Note (Signed)
 Well-controlled on current.

## 2024-02-29 NOTE — Telephone Encounter (Signed)
 tirzepatide  (ZEPBOUND ) 2.5 MG/0.5ML Pen  requires a PA.

## 2024-02-29 NOTE — Assessment & Plan Note (Signed)
-  Discussed healthy lifestyle, including increased physical activity and better food choices to promote weight loss. - Message sent to PA team in regards to Zepbound  approval.

## 2024-02-29 NOTE — Assessment & Plan Note (Signed)
A1c remains stable at 5.8.

## 2024-03-01 DIAGNOSIS — H35372 Puckering of macula, left eye: Secondary | ICD-10-CM | POA: Diagnosis not present

## 2024-03-17 ENCOUNTER — Other Ambulatory Visit: Payer: Self-pay | Admitting: Internal Medicine

## 2024-03-17 DIAGNOSIS — G4733 Obstructive sleep apnea (adult) (pediatric): Secondary | ICD-10-CM

## 2024-04-16 ENCOUNTER — Other Ambulatory Visit: Payer: Self-pay | Admitting: Internal Medicine

## 2024-04-16 DIAGNOSIS — G4733 Obstructive sleep apnea (adult) (pediatric): Secondary | ICD-10-CM

## 2024-04-17 NOTE — Telephone Encounter (Signed)
" °  Would you like to stay on the same dose or increase your Zepbound ?  "

## 2024-04-28 ENCOUNTER — Ambulatory Visit: Admitting: Adult Health

## 2024-04-28 ENCOUNTER — Ambulatory Visit: Payer: BC Managed Care – PPO | Admitting: Nurse Practitioner

## 2024-06-06 ENCOUNTER — Ambulatory Visit: Admitting: Adult Health

## 2024-06-29 ENCOUNTER — Ambulatory Visit: Admitting: Internal Medicine

## 2024-07-05 ENCOUNTER — Ambulatory Visit: Admitting: Internal Medicine

## 2024-10-24 ENCOUNTER — Ambulatory Visit: Admitting: Internal Medicine
